# Patient Record
Sex: Male | Born: 1950 | Race: White | Hispanic: No | Marital: Married | Smoking: Never smoker
Health system: Southern US, Community
[De-identification: ages and names within clinical notes are randomized; demographics above are authoritative.]

## PROBLEM LIST (undated history)

## (undated) DIAGNOSIS — F32A Depression, unspecified: Secondary | ICD-10-CM

## (undated) DIAGNOSIS — K219 Gastro-esophageal reflux disease without esophagitis: Secondary | ICD-10-CM

## (undated) DIAGNOSIS — F419 Anxiety disorder, unspecified: Secondary | ICD-10-CM

## (undated) DIAGNOSIS — C9590 Leukemia, unspecified not having achieved remission: Secondary | ICD-10-CM

## (undated) DIAGNOSIS — J189 Pneumonia, unspecified organism: Secondary | ICD-10-CM

## (undated) DIAGNOSIS — E119 Type 2 diabetes mellitus without complications: Secondary | ICD-10-CM

## (undated) HISTORY — PX: WISDOM TOOTH EXTRACTION: SHX21

## (undated) HISTORY — DX: Leukemia, unspecified not having achieved remission: C95.90

## (undated) HISTORY — PX: COLONOSCOPY: SHX174

## (undated) HISTORY — DX: Type 2 diabetes mellitus without complications: E11.9

---

## 2017-04-02 ENCOUNTER — Encounter: Payer: Self-pay | Admitting: Oncology

## 2017-04-02 ENCOUNTER — Inpatient Hospital Stay: Payer: 59 | Attending: Oncology | Admitting: Oncology

## 2017-04-02 DIAGNOSIS — G629 Polyneuropathy, unspecified: Secondary | ICD-10-CM | POA: Diagnosis not present

## 2017-04-02 DIAGNOSIS — C9110 Chronic lymphocytic leukemia of B-cell type not having achieved remission: Secondary | ICD-10-CM | POA: Diagnosis not present

## 2017-04-02 DIAGNOSIS — M21379 Foot drop, unspecified foot: Secondary | ICD-10-CM

## 2017-04-02 DIAGNOSIS — Z800 Family history of malignant neoplasm of digestive organs: Secondary | ICD-10-CM | POA: Diagnosis not present

## 2017-04-02 DIAGNOSIS — Z7984 Long term (current) use of oral hypoglycemic drugs: Secondary | ICD-10-CM | POA: Insufficient documentation

## 2017-04-02 DIAGNOSIS — E119 Type 2 diabetes mellitus without complications: Secondary | ICD-10-CM | POA: Diagnosis not present

## 2017-04-02 DIAGNOSIS — R634 Abnormal weight loss: Secondary | ICD-10-CM

## 2017-04-02 DIAGNOSIS — Z79899 Other long term (current) drug therapy: Secondary | ICD-10-CM | POA: Diagnosis not present

## 2017-04-02 NOTE — Progress Notes (Signed)
Patient is here today for new patient appointment he is on disability so any notes needs to be faxed to his employer.

## 2017-04-02 NOTE — Progress Notes (Signed)
[facility]ravis [name]  Telephone:([phone] Fax:(336) [phone]  ID: Weldon Picking OB: [birth year]  MR#: [id number]  WSF#:[id number]  Patient Care Team: No Pcp Per Patient as PCP - General (General Practice)  CHIEF COMPLAINT: CLL.  INTERVAL HISTORY: Patient is a 66 year old male who was originally diagnosed with CLL in 2006. Patient was asymptomatic with a stable white blood count at that time, therefore no treatment was initiated. More recently he started having abnormal weight loss as well as multiple neurologic symptoms including neuropathy and foot drop that after complete workup was determined to be secondary to his CLL. He was subsequently initiated on Imbruvica with improvement of his symptoms. He continues to have chronic neurologic problems, but otherwise feels at his baseline. He denies any recent fevers or illnesses. He denies any further weight loss. He has a fair appetite. He has no chest pain or shortness of breath. He denies any nausea, vomiting, constipation, or diarrhea. He has no urinary complaints. Patient offers no further specific complaints today.  REVIEW OF SYSTEMS:   Review of Systems  Constitutional: Positive for weight loss. Negative for fever and malaise/fatigue.  Respiratory: Negative.  Negative for cough and shortness of breath.   Cardiovascular: Negative.  Negative for chest pain and leg swelling.  Gastrointestinal: Negative.  Negative for abdominal pain.  Genitourinary: Negative.   Musculoskeletal: Negative.   Neurological: Positive for tingling, sensory change and focal weakness. Negative for weakness.  Psychiatric/Behavioral: Negative.  The patient is not nervous/anxious.     As per HPI. Otherwise, a complete review of systems is negative.  PAST MEDICAL HISTORY: Past Medical History:  Diagnosis Date  . Diabetes mellitus without complication ([place])   . Leukemia ([place])     PAST SURGICAL HISTORY: History reviewed. No pertinent surgical  history.  FAMILY HISTORY: Family History  Problem Relation Age of Onset  . Leukemia Mother   . Colon cancer Father   . Diabetes Father   . Heart disease Father   . Diabetes Brother     ADVANCED DIRECTIVES (Y/N):  N  HEALTH MAINTENANCE: Social History  Substance Use Topics  . Smoking status: Never Smoker  . Smokeless tobacco: Never Used  . Alcohol use No     Colonoscopy:  PAP:  Bone density:  Lipid panel:  No Known Allergies  Current Outpatient Prescriptions  Medication Sig Dispense Refill  . acetaminophen (TYLENOL) 500 MG tablet Take 2,000 mg by mouth daily.    . Ibrutinib (IMBRUVICA) 420 MG TABS Take 420 mg by mouth daily.    . metFORMIN (GLUCOPHAGE) 1000 MG tablet Take 1,000 mg by mouth 2 (two) times daily with a meal.     No current facility-administered medications for this visit.     OBJECTIVE: Vitals:   04/02/17 1341  BP: 133/81  Pulse: 69  Resp: 18  Temp: 97.2 F (36.2 C)     Body mass index is 24.11 kg/m.    ECOG FS:1 - Symptomatic but completely ambulatory  General: Well-developed, well-nourished, no acute distress. Eyes: Pink conjunctiva, anicteric sclera. HEENT: Normocephalic, moist mucous membranes, clear oropharnyx. Lungs: Clear to auscultation bilaterally. Heart: Regular rate and rhythm. No rubs, murmurs, or gallops. Abdomen: Soft, nontender, nondistended. No organomegaly noted, normoactive bowel sounds. Musculoskeletal: No edema, cyanosis, or clubbing. Neuro: Alert, answering all questions appropriately. Cranial nerves grossly intact. Skin: No rashes or petechiae noted. Psych: Normal affect. Lymphatics: No cervical, calvicular, axillary or inguinal LAD.   LAB RESULTS:  No results found for: NA, K, CL, CO2, GLUCOSE, BUN,  CREATININE, CALCIUM, PROT, ALBUMIN, AST, ALT, ALKPHOS, BILITOT, GFRNONAA, GFRAA  No results found for: WBC, NEUTROABS, HGB, HCT, MCV, PLT   STUDIES: No results found.  ASSESSMENT: CLL.  PLAN:    1. CLL: Patient  most recent white blood cell count on January 15, 2017 was reported as 110. This is increased from previous, the thought to be secondary to treatment effect.  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 PET scan revealed diffuse bilateral cervical, axillary, retroperitoneal, iliac, and inguinal lymphadenopathy with a low level of metabolic uptake consistent with CLL. 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 Patient initiated Dolores Lory in approximately November 2017 with significant improvement of his B symptoms including his unusual neuropathy symptoms. Will continue current treatment until intolerable side effects or progression of disease. Return to clinic in 6 weeks with laboratory work and further evaluation. 2. 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 3. Disability: Given patient's persistent neurologic symptoms, he wishes to continue disability at this time.  Patient expressed understanding and was in agreement with this plan. He also understands that He can call clinic at any time with any questions, concerns, or complaints.   Cancer Staging No matching staging information was found for the patient.  Lloyd Huger, MD   04/09/2017 12:39 AM

## 2017-04-20 ENCOUNTER — Telehealth: Payer: Self-pay

## 2017-04-20 NOTE — Telephone Encounter (Signed)
Spoke with diplomat and I will correct information on fax and have finn sign   Patient has been notified that I will fax over rx once corrected and signed

## 2017-04-20 NOTE — Telephone Encounter (Signed)
-----   Message from Secundino Ginger sent at 04/20/2017  1:29 PM EDT ----- Regarding: [place]: [phone] This pharm called me and said they needed a RX from [facility] for [place]. They are sending a template to your fax number. She said mark our the phy name on there and put Dr. Gary Fleet name and address. I talked to Caryl Pina but she said you could talk to anyone.

## 2017-05-12 NOTE — Progress Notes (Signed)
[name]  Telephone:(336) [phone] Fax:(336) [phone]  ID: Travis [name] OB: 1951-04-28  MR#: [id number]  TKW#:[id number]  Patient Care Team: Patient, No Pcp Per as PCP - General (General Practice)  CHIEF COMPLAINT: CLL.  INTERVAL HISTORY: Patient returns to clinic today for repeat laboratory work and further evaluation. He continues to tolerate Imbruvica without significant side effects. He has chronic weakness and fatigue.  He continues to have chronic neurologic problems, but otherwise feels at his baseline. He denies any recent fevers or illnesses. He denies any further weight loss. He has a fair appetite. He has no chest pain or shortness of breath. He denies any nausea, vomiting, constipation, or diarrhea. He has no urinary complaints. Patient offers no further specific complaints today.  REVIEW OF SYSTEMS:   Review of Systems  Constitutional: Negative for fever, malaise/fatigue and weight loss.  Respiratory: Negative.  Negative for cough and shortness of breath.   Cardiovascular: Negative.  Negative for chest pain and leg swelling.  Gastrointestinal: Negative.  Negative for abdominal pain.  Genitourinary: Negative.   Musculoskeletal: Negative.   Skin: Negative.  Negative for rash.  Neurological: Positive for tingling, sensory change and focal weakness. Negative for weakness.  Psychiatric/Behavioral: Negative.  The patient is not nervous/anxious.     As per HPI. Otherwise, a complete review of systems is negative.  PAST MEDICAL HISTORY: Past Medical History:  Diagnosis Date  . Diabetes mellitus without complication ([place])   . Leukemia ([place])     PAST SURGICAL HISTORY: No past surgical history on file.  FAMILY HISTORY: Family History  Problem Relation Age of Onset  . Leukemia Mother   . Colon cancer Father   . Diabetes Father   . Heart disease Father   . Diabetes Brother     ADVANCED DIRECTIVES (Y/N):  N  HEALTH MAINTENANCE: Social History    Substance Use Topics  . Smoking status: Never Smoker  . Smokeless tobacco: Never Used  . Alcohol use No     Colonoscopy:  PAP:  Bone density:  Lipid panel:  No Known Allergies  Current Outpatient Prescriptions  Medication Sig Dispense Refill  . acetaminophen (TYLENOL) 500 MG tablet Take 2,000 mg by mouth daily.    . Ibrutinib (IMBRUVICA) 420 MG TABS Take 420 mg by mouth daily.    . metFORMIN (GLUCOPHAGE) 1000 MG tablet Take 1,000 mg by mouth 2 (two) times daily with a meal.     No current facility-administered medications for this visit.     OBJECTIVE: Vitals:   05/14/17 1014  BP: 125/85  Pulse: 65  Resp: 20  Temp: 97.7 F (36.5 C)     Body mass index is 25.24 kg/m.    ECOG FS:1 - Symptomatic but completely ambulatory  General: Well-developed, well-nourished, no acute distress. Eyes: Pink conjunctiva, anicteric sclera. Lungs: Clear to auscultation bilaterally. Heart: Regular rate and rhythm. No rubs, murmurs, or gallops. Abdomen: Soft, nontender, nondistended. No organomegaly noted, normoactive bowel sounds. Musculoskeletal: No edema, cyanosis, or clubbing. Neuro: Alert, answering all questions appropriately. Cranial nerves grossly intact. Skin: No rashes or petechiae noted. Psych: Normal affect.   LAB RESULTS:  No results found for: NA, K, CL, CO2, GLUCOSE, BUN, CREATININE, CALCIUM, PROT, ALBUMIN, AST, ALT, ALKPHOS, BILITOT, GFRNONAA, GFRAA  Lab Results  Component Value Date   WBC 62.9 (HH) 05/14/2017   NEUTROABS 5.0 05/14/2017   HGB 14.3 05/14/2017   HCT 44.8 05/14/2017   MCV 81.9 05/14/2017   PLT 147 (L) 05/14/2017  STUDIES: No results found.  ASSESSMENT: CLL.  PLAN:    1. CLL: Patient most recent white blood cell count on January 15, 2017 was reported as 110. Today's result is now down to 62.9. 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 PET scan revealed diffuse bilateral cervical,  axillary, retroperitoneal, iliac, and inguinal lymphadenopathy with a low level of metabolic uptake consistent with CLL. 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 Patient initiated Dolores Lory in approximately November 2017 with significant improvement of his B symptoms including his unusual neuropathy symptoms. Will continue current treatment until intolerable side effects or progression of disease. Return to clinic in 6 weeks with laboratory work and then in 3 months with laboratory work and further evaluation. 2. 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 Monitor. 3. Disability: Given patient's persistent neurologic symptoms, he wishes to continue disability at this time.  Approximately 30 minutes was spent in discussion of which greater than 50% was consultation.  Patient expressed understanding and was in agreement with this plan. He also understands that He can call clinic at any time with any questions, concerns, or complaints.    Travis Huger, MD   05/14/2017 10:32 AM

## 2017-05-14 ENCOUNTER — Inpatient Hospital Stay: Payer: 59

## 2017-05-14 ENCOUNTER — Inpatient Hospital Stay: Payer: 59 | Attending: Oncology | Admitting: Oncology

## 2017-05-14 VITALS — BP 125/85 | HR 65 | Temp 97.7°F | Resp 20 | Wt 181.0 lb

## 2017-05-14 DIAGNOSIS — R5383 Other fatigue: Secondary | ICD-10-CM | POA: Diagnosis not present

## 2017-05-14 DIAGNOSIS — Z800 Family history of malignant neoplasm of digestive organs: Secondary | ICD-10-CM

## 2017-05-14 DIAGNOSIS — Z7984 Long term (current) use of oral hypoglycemic drugs: Secondary | ICD-10-CM | POA: Diagnosis not present

## 2017-05-14 DIAGNOSIS — Z79899 Other long term (current) drug therapy: Secondary | ICD-10-CM

## 2017-05-14 DIAGNOSIS — C9110 Chronic lymphocytic leukemia of B-cell type not having achieved remission: Secondary | ICD-10-CM

## 2017-05-14 DIAGNOSIS — G629 Polyneuropathy, unspecified: Secondary | ICD-10-CM | POA: Diagnosis not present

## 2017-05-14 DIAGNOSIS — E119 Type 2 diabetes mellitus without complications: Secondary | ICD-10-CM | POA: Diagnosis not present

## 2017-05-14 DIAGNOSIS — R531 Weakness: Secondary | ICD-10-CM | POA: Diagnosis not present

## 2017-05-14 LAB — CBC WITH DIFFERENTIAL/PLATELET
BASOS ABS: 0.3 10*3/uL — AB (ref 0–0.1)
BASOS PCT: 1 %
EOS PCT: 1 %
Eosinophils Absolute: 0.3 10*3/uL (ref 0–0.7)
HEMATOCRIT: 44.8 % (ref 40.0–52.0)
Hemoglobin: 14.3 g/dL (ref 13.0–18.0)
Lymphocytes Relative: 88 %
Lymphs Abs: 56.3 10*3/uL — ABNORMAL HIGH (ref 1.0–3.6)
MCH: 26.2 pg (ref 26.0–34.0)
MCHC: 32 g/dL (ref 32.0–36.0)
MCV: 81.9 fL (ref 80.0–100.0)
MONO ABS: 1 10*3/uL (ref 0.2–1.0)
Monocytes Relative: 2 %
NEUTROS ABS: 5 10*3/uL (ref 1.4–6.5)
Neutrophils Relative %: 8 %
Platelets: 147 10*3/uL — ABNORMAL LOW (ref 150–440)
RBC: 5.47 MIL/uL (ref 4.40–5.90)
RDW: 14.7 % — AB (ref 11.5–14.5)
WBC: 62.9 10*3/uL (ref 3.8–10.6)

## 2017-05-14 NOTE — Progress Notes (Signed)
Patient reports being more unsteady on his feet, no other complaints.

## 2017-06-25 ENCOUNTER — Inpatient Hospital Stay: Payer: 59 | Attending: Oncology

## 2017-06-25 DIAGNOSIS — C9110 Chronic lymphocytic leukemia of B-cell type not having achieved remission: Secondary | ICD-10-CM

## 2017-06-29 LAB — CBC WITH DIFFERENTIAL/PLATELET
BASOS PCT: 0 %
Basophils Absolute: 0.1 10*3/uL (ref 0–0.1)
EOS PCT: 1 %
Eosinophils Absolute: 0.3 10*3/uL (ref 0–0.7)
HEMATOCRIT: 44.4 % (ref 40.0–52.0)
Hemoglobin: 14.5 g/dL (ref 13.0–18.0)
LYMPHS PCT: 87 %
Lymphs Abs: 45.4 10*3/uL — ABNORMAL HIGH (ref 1.0–3.6)
MCH: 27 pg (ref 26.0–34.0)
MCHC: 32.6 g/dL (ref 32.0–36.0)
MCV: 82.8 fL (ref 80.0–100.0)
MONO ABS: 0.9 10*3/uL (ref 0.2–1.0)
MONOS PCT: 2 %
NEUTROS ABS: 5.2 10*3/uL (ref 1.4–6.5)
Neutrophils Relative %: 10 %
PLATELETS: 139 10*3/uL — AB (ref 150–440)
RBC: 5.37 MIL/uL (ref 4.40–5.90)
RDW: 14.9 % — AB (ref 11.5–14.5)
WBC: 51.9 10*3/uL (ref 4.0–10.5)

## 2017-08-04 NOTE — Progress Notes (Signed)
[facility]  Telephone:(336) [phone] Fax:(336) [phone]  ID: Travis [name] OB: 1951/12/10  MR#: [id number]  RCB#:[id number]  Patient Care Team: Patient, No Pcp Per as PCP - General (General Practice)  CHIEF COMPLAINT: CLL.  INTERVAL HISTORY: Patient returns to clinic today for repeat laboratory work and further evaluation. He continues to tolerate Imbruvica without significant side effects. He has chronic weakness and fatigue.  He continues to have chronic neurologic problems, but otherwise feels at his baseline. He denies any recent fevers or illnesses. He denies any further weight loss. He has a fair appetite. He has no chest pain or shortness of breath. He denies any nausea, vomiting, constipation, or diarrhea. He has no urinary complaints. Patient offers no further specific complaints today.  REVIEW OF SYSTEMS:   Review of Systems  Constitutional: Negative for fever, malaise/fatigue and weight loss.  Respiratory: Negative.  Negative for cough and shortness of breath.   Cardiovascular: Negative.  Negative for chest pain and leg swelling.  Gastrointestinal: Negative.  Negative for abdominal pain.  Genitourinary: Negative.   Musculoskeletal: Negative.   Skin: Negative.  Negative for rash.  Neurological: Positive for tingling, sensory change and focal weakness. Negative for weakness.  Psychiatric/Behavioral: Negative.  The patient is not nervous/anxious.     As per HPI. Otherwise, a complete review of systems is negative.  PAST MEDICAL HISTORY: Past Medical History:  Diagnosis Date  . Diabetes mellitus without complication ([place])   . Leukemia ([place])     PAST SURGICAL HISTORY: No past surgical history on file.  FAMILY HISTORY: Family History  Problem Relation Age of Onset  . Leukemia Mother   . Colon cancer Father   . Diabetes Father   . Heart disease Father   . Diabetes Brother     ADVANCED DIRECTIVES (Y/N):  N  HEALTH MAINTENANCE: Social History    Substance Use Topics  . Smoking status: Never Smoker  . Smokeless tobacco: Never Used  . Alcohol use No     Colonoscopy:  PAP:  Bone density:  Lipid panel:  No Known Allergies  Current Outpatient Prescriptions  Medication Sig Dispense Refill  . acetaminophen (TYLENOL) 500 MG tablet Take 2,000 mg by mouth daily.    . Ibrutinib (IMBRUVICA) 420 MG TABS Take 420 mg by mouth daily.    . metFORMIN (GLUCOPHAGE) 1000 MG tablet Take 1,000 mg by mouth 2 (two) times daily with a meal.     No current facility-administered medications for this visit.     OBJECTIVE: Vitals:   08/06/17 1043  BP: 135/85  Pulse: 64  Resp: 18  Temp: (!) 96.7 F (35.9 C)     Body mass index is 26.03 kg/m.    ECOG FS:1 - Symptomatic but completely ambulatory  General: Well-developed, well-nourished, no acute distress. Eyes: Pink conjunctiva, anicteric sclera. Lungs: Clear to auscultation bilaterally. Heart: Regular rate and rhythm. No rubs, murmurs, or gallops. Abdomen: Soft, nontender, nondistended. No organomegaly noted, normoactive bowel sounds. Musculoskeletal: No edema, cyanosis, or clubbing. Neuro: Alert, answering all questions appropriately. Cranial nerves grossly intact. Skin: No rashes or petechiae noted. Psych: Normal affect.   LAB RESULTS:  No results found for: NA, K, CL, CO2, GLUCOSE, BUN, CREATININE, CALCIUM, PROT, ALBUMIN, AST, ALT, ALKPHOS, BILITOT, GFRNONAA, GFRAA  Lab Results  Component Value Date   WBC 47.6 (H) 08/06/2017   NEUTROABS 5.2 08/06/2017   HGB 14.8 08/06/2017   HCT 45.8 08/06/2017   MCV 83.9 08/06/2017   PLT 158 08/06/2017  STUDIES: No results found.  ASSESSMENT: CLL.  PLAN:    1. CLL: Patient's white blood count continues to trend down and is now 47.6. 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 PET scan revealed diffuse bilateral cervical, axillary, retroperitoneal, iliac, and inguinal  lymphadenopathy with a low level of metabolic uptake consistent with CLL. 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 Patient initiated Dolores Lory in approximately November 2017 with significant improvement of his B symptoms including his unusual neuropathy symptoms. Will continue current treatment until intolerable side effects or progression of disease. Return to clinic in 3 months for laboratory work and then in 6 months for laboratory work and further evaluation.  2. 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 Monitor. 3. Disability: Given patient's persistent neurologic symptoms, he wishes to continue disability at this time.  Approximately 30 minutes was spent in discussion of which greater than 50% was consultation.  Patient expressed understanding and was in agreement with this plan. He also understands that He can call clinic at any time with any questions, concerns, or complaints.    Lloyd Huger, MD   08/06/2017 1:14 PM

## 2017-08-06 ENCOUNTER — Inpatient Hospital Stay: Payer: 59

## 2017-08-06 ENCOUNTER — Inpatient Hospital Stay: Payer: 59 | Attending: Oncology | Admitting: Oncology

## 2017-08-06 VITALS — BP 135/85 | HR 64 | Temp 96.7°F | Resp 18 | Wt 186.6 lb

## 2017-08-06 DIAGNOSIS — C9110 Chronic lymphocytic leukemia of B-cell type not having achieved remission: Secondary | ICD-10-CM

## 2017-08-06 DIAGNOSIS — Z79899 Other long term (current) drug therapy: Secondary | ICD-10-CM | POA: Diagnosis not present

## 2017-08-06 DIAGNOSIS — G629 Polyneuropathy, unspecified: Secondary | ICD-10-CM

## 2017-08-06 DIAGNOSIS — R5383 Other fatigue: Secondary | ICD-10-CM

## 2017-08-06 DIAGNOSIS — R200 Anesthesia of skin: Secondary | ICD-10-CM | POA: Diagnosis not present

## 2017-08-06 DIAGNOSIS — Z7984 Long term (current) use of oral hypoglycemic drugs: Secondary | ICD-10-CM | POA: Diagnosis not present

## 2017-08-06 DIAGNOSIS — C9190 Lymphoid leukemia, unspecified not having achieved remission: Secondary | ICD-10-CM | POA: Diagnosis present

## 2017-08-06 DIAGNOSIS — E119 Type 2 diabetes mellitus without complications: Secondary | ICD-10-CM

## 2017-08-06 DIAGNOSIS — Z800 Family history of malignant neoplasm of digestive organs: Secondary | ICD-10-CM | POA: Diagnosis not present

## 2017-08-06 DIAGNOSIS — R531 Weakness: Secondary | ICD-10-CM | POA: Diagnosis not present

## 2017-08-06 LAB — CBC WITH DIFFERENTIAL/PLATELET
BASOS PCT: 1 %
Basophils Absolute: 0.2 10*3/uL — ABNORMAL HIGH (ref 0–0.1)
EOS ABS: 0.1 10*3/uL (ref 0–0.7)
EOS PCT: 0 %
HEMATOCRIT: 45.8 % (ref 40.0–52.0)
Hemoglobin: 14.8 g/dL (ref 13.0–18.0)
Lymphocytes Relative: 86 %
Lymphs Abs: 41.2 10*3/uL — ABNORMAL HIGH (ref 1.0–3.6)
MCH: 27.2 pg (ref 26.0–34.0)
MCHC: 32.4 g/dL (ref 32.0–36.0)
MCV: 83.9 fL (ref 80.0–100.0)
MONO ABS: 0.8 10*3/uL (ref 0.2–1.0)
MONOS PCT: 2 %
Neutro Abs: 5.2 10*3/uL (ref 1.4–6.5)
Neutrophils Relative %: 11 %
Platelets: 158 10*3/uL (ref 150–440)
RBC: 5.46 MIL/uL (ref 4.40–5.90)
RDW: 14.4 % (ref 11.5–14.5)
WBC: 47.6 10*3/uL — ABNORMAL HIGH (ref 3.8–10.6)

## 2017-10-06 ENCOUNTER — Other Ambulatory Visit: Payer: Self-pay | Admitting: Oncology

## 2017-10-06 DIAGNOSIS — C9110 Chronic lymphocytic leukemia of B-cell type not having achieved remission: Secondary | ICD-10-CM

## 2017-11-01 ENCOUNTER — Telehealth: Payer: Self-pay | Admitting: Oncology

## 2017-11-01 NOTE — Telephone Encounter (Signed)
[facility] Patient Advocate Encounter  Received paper work from [place] to do a written PA for patient. Faxed back to them.   [place] Patient Advocate [phone] 11/01/2017 1:31 PM

## 2017-11-05 ENCOUNTER — Inpatient Hospital Stay: Payer: 59

## 2017-11-09 ENCOUNTER — Inpatient Hospital Stay: Payer: 59 | Attending: Oncology

## 2017-11-09 DIAGNOSIS — C9110 Chronic lymphocytic leukemia of B-cell type not having achieved remission: Secondary | ICD-10-CM

## 2017-11-09 DIAGNOSIS — C9190 Lymphoid leukemia, unspecified not having achieved remission: Secondary | ICD-10-CM | POA: Insufficient documentation

## 2017-11-09 LAB — CBC WITH DIFFERENTIAL/PLATELET
Basophils Absolute: 0.1 10*3/uL (ref 0–0.1)
Basophils Relative: 1 %
EOS ABS: 0.2 10*3/uL (ref 0–0.7)
EOS PCT: 1 %
HCT: 46.3 % (ref 40.0–52.0)
Hemoglobin: 15.2 g/dL (ref 13.0–18.0)
LYMPHS ABS: 20.6 10*3/uL — AB (ref 1.0–3.6)
LYMPHS PCT: 77 %
MCH: 27.5 pg (ref 26.0–34.0)
MCHC: 32.8 g/dL (ref 32.0–36.0)
MCV: 83.9 fL (ref 80.0–100.0)
MONO ABS: 0.7 10*3/uL (ref 0.2–1.0)
Monocytes Relative: 3 %
Neutro Abs: 4.6 10*3/uL (ref 1.4–6.5)
Neutrophils Relative %: 18 %
PLATELETS: 144 10*3/uL — AB (ref 150–440)
RBC: 5.52 MIL/uL (ref 4.40–5.90)
RDW: 14 % (ref 11.5–14.5)
WBC: 26.3 10*3/uL — AB (ref 3.8–10.6)

## 2018-02-04 NOTE — Progress Notes (Signed)
[facility]  [phone] [phone]  ID: Travis [name] OB: January 15, 1951  MR#: [id number]  CSN#:[id number]  Patient Care Team: Patient, No Pcp Per as PCP - General ([facility]) Travis Lame, MD as Consulting Physician (Gastroenterology) Travis Bene, MD as Referring Physician (Neurology)  CHIEF COMPLAINT: CLL.  INTERVAL HISTORY: Patient returns to clinic today for repeat laboratory work and further evaluation. He has noticed increased difficulty swallowing of both solids and liquids over the past several weeks to months.  Patient states that he feels the food becomes "stuck" and then he subsequently vomits.  He also has noted the weakness and fatigue and neuropathy secondary to his nerve demyelination have been becoming worse over the same timeframe.  He otherwise is tolerating Imbruvica without significant side effects. He has chronic weakness and fatigue.  He continues to have chronic neurologic problems. He denies any recent fevers or illnesses. He denies any further weight loss. He has a fair appetite. He has no chest pain or shortness of breath. He denies any nausea, vomiting, constipation, or diarrhea. He has no urinary complaints. Patient offers no further specific complaints today.  REVIEW OF SYSTEMS:   Review of Systems  Constitutional: Positive for malaise/fatigue. Negative for fever and weight loss.  Respiratory: Negative.  Negative for cough and shortness of breath.   Cardiovascular: Negative.  Negative for chest pain and leg swelling.  Gastrointestinal: Positive for vomiting. Negative for abdominal pain, blood in stool, melena and nausea.  Genitourinary: Negative.   Musculoskeletal: Negative.   Skin: Negative.  Negative for rash.  Neurological: Positive for tingling, sensory change, focal weakness and weakness.  Psychiatric/Behavioral: Positive for depression. The patient is not nervous/anxious.     As per HPI. Otherwise, a  complete review of systems is negative.  PAST MEDICAL HISTORY: Past Medical History:  Diagnosis Date  . Diabetes mellitus without complication ([place])   . Leukemia ([place])     PAST SURGICAL HISTORY: No past surgical history on file.  FAMILY HISTORY: Family History  Problem Relation Age of Onset  . Leukemia Mother   . Colon cancer Father   . Diabetes Father   . Heart disease Father   . Diabetes Brother     ADVANCED DIRECTIVES (Y/N):  N  HEALTH MAINTENANCE: Social History   Tobacco Use  . Smoking status: Never Smoker  . Smokeless tobacco: Never Used  Substance Use Topics  . Alcohol use: No  . Drug use: No     Colonoscopy:  PAP:  Bone density:  Lipid panel:  No Known Allergies  Current Outpatient Medications  Medication Sig Dispense Refill  . NON FORMULARY Take 750 mg by mouth.    Marland Kitchen acetaminophen (TYLENOL) 500 MG tablet Take 2,000 mg by mouth daily.    . IMBRUVICA 420 MG TABS TAKE 1 TABLET (420MG) BY MOUTH ONE TIME DAILY WITH A FULL GLASS OF WATER. (Patient not taking: Reported on 02/11/2018) 28 tablet 5   No current facility-administered medications for this visit.     OBJECTIVE: Vitals:   02/11/18 1003  BP: (!) 154/89  Pulse: 68  Resp: 18  Temp: (!) 97.1 F (36.2 C)     Body mass index is 27.33 kg/m.    ECOG FS:1 - Symptomatic but completely ambulatory  General: Well-developed, well-nourished, no acute distress. Eyes: Pink conjunctiva, anicteric sclera. Lungs: Clear to auscultation bilaterally. Heart: Regular rate and rhythm. No rubs, murmurs, or gallops. Abdomen: Soft, nontender, nondistended. No organomegaly noted, normoactive bowel sounds. Musculoskeletal: No edema,  cyanosis, or clubbing. Neuro: Alert, answering all questions appropriately. Cranial nerves grossly intact. Skin: No rashes or petechiae noted. Psych: Normal affect.   LAB RESULTS:  No results found for: NA, K, CL, CO2, GLUCOSE, BUN, CREATININE, CALCIUM, PROT, ALBUMIN, AST, ALT,  ALKPHOS, BILITOT, GFRNONAA, GFRAA  Lab Results  Component Value Date   WBC 16.5 (H) 02/11/2018   NEUTROABS 4.0 02/11/2018   HGB 15.7 02/11/2018   HCT 46.7 02/11/2018   MCV 83.0 02/11/2018   PLT 137 (L) 02/11/2018     STUDIES: No results found.  ASSESSMENT: CLL.  PLAN:    1. CLL: Patient's white blood count continues to trend down and is now 16.5. 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 PET scan revealed diffuse bilateral cervical, axillary, retroperitoneal, iliac, and inguinal lymphadenopathy with a low level of metabolic uptake consistent with CLL. 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 Patient initiated Dolores Lory in approximately November 2017 with significant improvement of his B symptoms including his unusual neuropathy symptoms. Will continue current treatment until intolerable side effects or progression of disease. Return to clinic in 3 months for laboratory work and then in 6 months for laboratory work and further evaluation.  2. 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 Patient thinks his symptoms are becoming worse with increased difficulty walking recently.  He had a neurologist when living in [place], but has yet to establish care in [place].  A referral has been sent to neurology for further evaluation and treatment if necessary. 3.  Difficulty swallowing: See HPI for details.  A referral has been sent to GI for further evaluation including possible EGD and manometry. 4. Disability: Given patient's persistent neurologic symptoms, he wishes to continue disability at this time.  Approximately 30 minutes was spent in discussion of which greater than 50% was consultation.  Patient expressed understanding and was in agreement with this plan. He also understands that He can call clinic at any time with any questions,  concerns, or complaints.    Travis Huger, MD   02/11/2018 10:42 AM

## 2018-02-11 ENCOUNTER — Inpatient Hospital Stay: Payer: Managed Care, Other (non HMO) | Attending: Oncology | Admitting: Oncology

## 2018-02-11 ENCOUNTER — Other Ambulatory Visit: Payer: Self-pay

## 2018-02-11 ENCOUNTER — Inpatient Hospital Stay: Payer: Managed Care, Other (non HMO)

## 2018-02-11 VITALS — BP 154/89 | HR 68 | Temp 97.1°F | Resp 18 | Wt 196.0 lb

## 2018-02-11 DIAGNOSIS — Z79899 Other long term (current) drug therapy: Secondary | ICD-10-CM | POA: Diagnosis not present

## 2018-02-11 DIAGNOSIS — R531 Weakness: Secondary | ICD-10-CM

## 2018-02-11 DIAGNOSIS — M21379 Foot drop, unspecified foot: Secondary | ICD-10-CM | POA: Diagnosis not present

## 2018-02-11 DIAGNOSIS — C9110 Chronic lymphocytic leukemia of B-cell type not having achieved remission: Secondary | ICD-10-CM | POA: Diagnosis present

## 2018-02-11 DIAGNOSIS — Z806 Family history of leukemia: Secondary | ICD-10-CM | POA: Insufficient documentation

## 2018-02-11 DIAGNOSIS — R1110 Vomiting, unspecified: Secondary | ICD-10-CM | POA: Diagnosis not present

## 2018-02-11 DIAGNOSIS — G629 Polyneuropathy, unspecified: Secondary | ICD-10-CM | POA: Insufficient documentation

## 2018-02-11 DIAGNOSIS — C9190 Lymphoid leukemia, unspecified not having achieved remission: Secondary | ICD-10-CM

## 2018-02-11 DIAGNOSIS — F329 Major depressive disorder, single episode, unspecified: Secondary | ICD-10-CM | POA: Diagnosis not present

## 2018-02-11 DIAGNOSIS — R5383 Other fatigue: Secondary | ICD-10-CM | POA: Insufficient documentation

## 2018-02-11 DIAGNOSIS — R5381 Other malaise: Secondary | ICD-10-CM | POA: Diagnosis not present

## 2018-02-11 DIAGNOSIS — R1310 Dysphagia, unspecified: Secondary | ICD-10-CM | POA: Diagnosis not present

## 2018-02-11 DIAGNOSIS — E119 Type 2 diabetes mellitus without complications: Secondary | ICD-10-CM | POA: Diagnosis not present

## 2018-02-11 DIAGNOSIS — Z800 Family history of malignant neoplasm of digestive organs: Secondary | ICD-10-CM

## 2018-02-11 LAB — CBC WITH DIFFERENTIAL/PLATELET
Basophils Absolute: 0.1 10*3/uL (ref 0–0.1)
Basophils Relative: 1 %
Eosinophils Absolute: 0.2 10*3/uL (ref 0–0.7)
Eosinophils Relative: 1 %
HCT: 46.7 % (ref 40.0–52.0)
Hemoglobin: 15.7 g/dL (ref 13.0–18.0)
LYMPHS PCT: 70 %
Lymphs Abs: 11.6 10*3/uL — ABNORMAL HIGH (ref 1.0–3.6)
MCH: 27.8 pg (ref 26.0–34.0)
MCHC: 33.5 g/dL (ref 32.0–36.0)
MCV: 83 fL (ref 80.0–100.0)
MONOS PCT: 4 %
Monocytes Absolute: 0.6 10*3/uL (ref 0.2–1.0)
NEUTROS ABS: 4 10*3/uL (ref 1.4–6.5)
Neutrophils Relative %: 24 %
Platelets: 137 10*3/uL — ABNORMAL LOW (ref 150–440)
RBC: 5.63 MIL/uL (ref 4.40–5.90)
RDW: 13.5 % (ref 11.5–14.5)
WBC: 16.5 10*3/uL — ABNORMAL HIGH (ref 3.8–10.6)

## 2018-02-11 NOTE — Progress Notes (Signed)
Here for follow up. Admitted feeling depressed -counseling info given. Stated often has to vomit food up-since feels like it gets " stuck" -

## 2018-02-24 ENCOUNTER — Encounter: Payer: Self-pay | Admitting: Gastroenterology

## 2018-02-24 ENCOUNTER — Ambulatory Visit: Payer: Managed Care, Other (non HMO) | Admitting: Gastroenterology

## 2018-02-24 VITALS — BP 132/77 | HR 78 | Ht 71.0 in | Wt 195.8 lb

## 2018-02-24 DIAGNOSIS — R1310 Dysphagia, unspecified: Secondary | ICD-10-CM | POA: Diagnosis not present

## 2018-02-24 MED ORDER — OMEPRAZOLE 40 MG PO CPDR: 40.0000 mg | DELAYED_RELEASE_CAPSULE | Freq: Every day | ORAL | 3 refills | Status: DC

## 2018-02-24 NOTE — Progress Notes (Signed)
Travis Bellows MD, MRCP(U.K) [address]  [place]  [place], [place] [address]  Main: [phone]  Fax: [phone]   Gastroenterology Consultation  Referring Provider:     Lloyd Huger, MD Primary Care Physician:  Travis Huger, MD Primary Gastroenterologist:  Dr. Jonathon [name]  Reason for Consultation:     Dysphagia         HPI:   Travis [name] is a 67 y.o. y/o male referred for consultation & management  by Dr. Grayland [name], Travis November, MD.     Dysphagia: Onset and any progression: 3-4 years - about the same, noticing more oftn , occasionally once a day and sometimes every other day  Frequency: as above . At times he has to throw up  Foods affected : solids, liquids, mashed potatoes, creamed corn or baby carrots. Not with water. Some days his medications . Feels his spincter does not open  Prior episodes of impaction: no  History of asthma/allergy : no  History of heartburn/Reflux : yes some reflux, takes tums  Weight loss/weight gain : last 1 year has gained weight - he has not noticed any change of symptoms as he gained weight   Prior EGD: no  PPI/H2 blocker use : no      Past Medical History:  Diagnosis Date  . Diabetes mellitus without complication ([place])   . Leukemia ([place])     No past surgical history on file.  Prior to Admission medications   Medication Sig Start Date End Date Taking? Authorizing Provider  acetaminophen (TYLENOL) 500 MG tablet Take 2,000 mg by mouth daily.    [provider]  IMBRUVICA 420 MG TABS TAKE 1 TABLET (420MG ) BY MOUTH ONE TIME DAILY WITH A FULL GLASS OF WATER. Patient not taking: Reported on 02/11/2018 10/06/17   Travis Huger, MD  NON FORMULARY Take 750 mg by mouth.    [provider]    Family History  Problem Relation Age of Onset  . Leukemia Mother   . Colon cancer Father   . Diabetes Father   . Heart disease Father   . Diabetes Brother      Social History   Tobacco Use  . Smoking  status: Never Smoker  . Smokeless tobacco: Never Used  Substance Use Topics  . Alcohol use: No  . Drug use: No    Allergies as of 02/24/2018  . (No Known Allergies)    Review of Systems:    All systems reviewed and negative except where noted in HPI.   Physical Exam:  BP 132/77 (BP Location: Left Arm, Patient Position: Sitting, Cuff Size: Normal)   Pulse 78   Ht 5\' 11"  (1.803 m)   Wt 195 lb 12.8 oz (88.8 kg)   BMI 27.31 kg/m  No LMP for male patient. Psych:  Alert and cooperative. Normal mood and affect. General:   Alert,  Well-developed, well-nourished, pleasant and cooperative in NAD Head:  Normocephalic and atraumatic. Eyes:  Sclera clear, no icterus.   Conjunctiva pink. Ears:  Normal auditory acuity. Nose:  No deformity, discharge, or lesions. Mouth:  No deformity or lesions,oropharynx pink & moist. Neck:  Supple; no masses or thyromegaly. Lungs:  Respirations even and unlabored.  Clear throughout to auscultation.   No wheezes, crackles, or rhonchi. No acute distress. Heart:  Regular rate and rhythm; no murmurs, clicks, rubs, or gallops. Abdomen:  Normal bowel sounds.  No bruits.  Soft, non-tender and non-distended without masses, hepatosplenomegaly or hernias noted.  No guarding or rebound tenderness.    Msk:  Symmetrical without gross deformities. Good, equal movement & strength bilaterally. Pulses:  Normal pulses noted. Extremities:  No clubbing or edema.  No cyanosis. Neurologic:  Alert and oriented x3;  grossly normal neurologically. Skin:  Intact without significant lesions or rashes. No jaundice. Lymph Nodes:  No significant cervical adenopathy. Psych:  Alert and cooperative. Normal mood and affect.  Imaging Studies: No results found.  Assessment and Plan:   Travis [name] is a 67 y.o. y/o male has been referred for dysphagia.History suggests dysphagia for solids and liquids. 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   Plan  1. Will empirically treat for GERD with  prilosec 40 mg  2. Proceed with EGD+/- dilaton and r/o EOE 3. If above do not work will need manometry to r/o achalasia.   I have discussed alternative options, risks & benefits,  which include, but are not limited to, bleeding, infection, perforation,respiratory complication & drug reaction.  The patient agrees with this plan & written consent will be obtained.      Follow up in 12 weeks   Dr Travis Bellows MD,MRCP(U.K)

## 2018-03-08 ENCOUNTER — Telehealth: Payer: Self-pay

## 2018-03-08 NOTE — Telephone Encounter (Signed)
Patients EGD Instructions have been emailed to him at email address: [email or web address]  No rx is required for EGD.  Thanks [employer]

## 2018-03-14 ENCOUNTER — Encounter
Admission: RE | Disposition: A | Discharge status: Home / Self Care | Payer: Self-pay | Source: Ambulatory Visit | Attending: Gastroenterology

## 2018-03-14 ENCOUNTER — Ambulatory Visit: Payer: Managed Care, Other (non HMO) | Admitting: Anesthesiology

## 2018-03-14 ENCOUNTER — Ambulatory Visit
Admission: RE | Admit: 2018-03-14 | Discharge: 2018-03-14 | Disposition: A | Discharge status: Home / Self Care | Payer: Managed Care, Other (non HMO) | Source: Ambulatory Visit | Attending: Gastroenterology | Admitting: Gastroenterology

## 2018-03-14 DIAGNOSIS — R1310 Dysphagia, unspecified: Secondary | ICD-10-CM

## 2018-03-14 DIAGNOSIS — Z79899 Other long term (current) drug therapy: Secondary | ICD-10-CM | POA: Diagnosis not present

## 2018-03-14 DIAGNOSIS — K2970 Gastritis, unspecified, without bleeding: Secondary | ICD-10-CM | POA: Diagnosis not present

## 2018-03-14 DIAGNOSIS — K449 Diaphragmatic hernia without obstruction or gangrene: Secondary | ICD-10-CM | POA: Diagnosis not present

## 2018-03-14 DIAGNOSIS — K222 Esophageal obstruction: Secondary | ICD-10-CM

## 2018-03-14 DIAGNOSIS — K209 Esophagitis, unspecified: Secondary | ICD-10-CM

## 2018-03-14 DIAGNOSIS — K2950 Unspecified chronic gastritis without bleeding: Secondary | ICD-10-CM | POA: Insufficient documentation

## 2018-03-14 DIAGNOSIS — E119 Type 2 diabetes mellitus without complications: Secondary | ICD-10-CM | POA: Diagnosis not present

## 2018-03-14 DIAGNOSIS — Z7984 Long term (current) use of oral hypoglycemic drugs: Secondary | ICD-10-CM | POA: Diagnosis not present

## 2018-03-14 DIAGNOSIS — K2980 Duodenitis without bleeding: Secondary | ICD-10-CM | POA: Insufficient documentation

## 2018-03-14 DIAGNOSIS — K2990 Gastroduodenitis, unspecified, without bleeding: Secondary | ICD-10-CM

## 2018-03-14 HISTORY — PX: ESOPHAGOGASTRODUODENOSCOPY (EGD) WITH PROPOFOL: SHX5813

## 2018-03-14 LAB — GLUCOSE, CAPILLARY: GLUCOSE-CAPILLARY: 136 mg/dL — AB (ref 65–99)

## 2018-03-14 SURGERY — ESOPHAGOGASTRODUODENOSCOPY (EGD) WITH PROPOFOL
Anesthesia: General

## 2018-03-14 MED ORDER — SODIUM CHLORIDE 0.9 % IV SOLN
INTRAVENOUS | Status: DC
  Administered 2018-03-14: 13:00:00 via INTRAVENOUS
  Administered 2018-03-14: 1000 mL via INTRAVENOUS

## 2018-03-14 MED ORDER — LIDOCAINE HCL (CARDIAC) 20 MG/ML IV SOLN
INTRAVENOUS | Status: DC | PRN
  Administered 2018-03-14: 40 mg via INTRAVENOUS

## 2018-03-14 MED ORDER — FENTANYL CITRATE (PF) 100 MCG/2ML IJ SOLN
INTRAMUSCULAR | Status: AC
  Filled 2018-03-14: qty 2

## 2018-03-14 MED ORDER — MIDAZOLAM HCL 2 MG/2ML IJ SOLN
INTRAMUSCULAR | Status: AC
  Filled 2018-03-14: qty 2

## 2018-03-14 MED ORDER — PROPOFOL 10 MG/ML IV BOLUS
INTRAVENOUS | Status: DC | PRN
  Administered 2018-03-14 (×2): 50 mg via INTRAVENOUS

## 2018-03-14 MED ORDER — FENTANYL CITRATE (PF) 100 MCG/2ML IJ SOLN
INTRAMUSCULAR | Status: DC | PRN
  Administered 2018-03-14: 50 ug via INTRAVENOUS

## 2018-03-14 MED ORDER — OMEPRAZOLE 40 MG PO CPDR: 40.0000 mg | DELAYED_RELEASE_CAPSULE | Freq: Two times a day (BID) | ORAL | 1 refills | Status: DC

## 2018-03-14 MED ORDER — MIDAZOLAM HCL 2 MG/2ML IJ SOLN
INTRAMUSCULAR | Status: DC | PRN
  Administered 2018-03-14: 2 mg via INTRAVENOUS

## 2018-03-14 MED ORDER — PROPOFOL 10 MG/ML IV BOLUS
INTRAVENOUS | Status: AC
  Filled 2018-03-14: qty 20

## 2018-03-14 MED ORDER — LIDOCAINE HCL (PF) 1 % IJ SOLN
2.0000 mL | Freq: Once | INTRAMUSCULAR | Status: AC
  Administered 2018-03-14: 0.3 mL via INTRADERMAL

## 2018-03-14 MED ORDER — LIDOCAINE HCL (PF) 2 % IJ SOLN
INTRAMUSCULAR | Status: AC
  Filled 2018-03-14: qty 10

## 2018-03-14 MED ORDER — SUCRALFATE 1 G PO TABS: 1.0000 g | ORAL_TABLET | Freq: Four times a day (QID) | ORAL | 1 refills | Status: DC

## 2018-03-14 MED ORDER — LIDOCAINE HCL (PF) 1 % IJ SOLN
INTRAMUSCULAR | Status: AC
  Administered 2018-03-14: 0.3 mL via INTRADERMAL
  Filled 2018-03-14: qty 2

## 2018-03-14 MED ORDER — PROPOFOL 500 MG/50ML IV EMUL
INTRAVENOUS | Status: DC | PRN
  Administered 2018-03-14: 200 ug/kg/min via INTRAVENOUS

## 2018-03-14 NOTE — Transfer of Care (Signed)
Immediate Anesthesia Transfer of Care Note  Patient: Travis [name].  Procedure(s) Performed: ESOPHAGOGASTRODUODENOSCOPY (EGD) WITH PROPOFOL (N/A )  Patient Location: PACU  Anesthesia Type:General  Level of Consciousness: awake  Airway & Oxygen Therapy: Patient Spontanous Breathing and Patient connected to nasal cannula oxygen  Post-op Assessment: Report given to RN and Post -op Vital signs reviewed and stable  Post vital signs: Reviewed and stable  Last Vitals:  Vitals:   03/14/18 1106 03/14/18 1257  BP: 127/76 108/77  Pulse: 76 80  Resp: 17 16  Temp: 36.7 C 36.5 C  SpO2: 100% 95%    Last Pain:  Vitals:   03/14/18 1257  TempSrc: Tympanic         Complications: No apparent anesthesia complications

## 2018-03-14 NOTE — Anesthesia Procedure Notes (Signed)
Date/Time: 03/14/2018 12:25 PM Performed by: Allean Found, CRNA Pre-anesthesia Checklist: Patient identified, Emergency Drugs available, Suction available, Patient being monitored and Timeout performed Oxygen Delivery Method: Nasal cannula Placement Confirmation: positive ETCO2 Dental Injury: Teeth and Oropharynx as per pre-operative assessment

## 2018-03-14 NOTE — Anesthesia Post-op Follow-up Note (Signed)
Anesthesia QCDR form completed.        

## 2018-03-14 NOTE — Anesthesia Preprocedure Evaluation (Signed)
Anesthesia Evaluation  Patient identified by MRN, date of birth, ID band Patient awake    Reviewed: Allergy & Precautions, NPO status , Patient's Chart, lab work & pertinent test results  History of Anesthesia Complications Negative for: history of anesthetic complications  Airway Mallampati: II  TM Distance: >3 FB Neck ROM: Full    Dental no notable dental hx.    Pulmonary neg pulmonary ROS, neg sleep apnea, neg COPD,    breath sounds clear to auscultation- rhonchi (-) wheezing      Cardiovascular Exercise Tolerance: Good (-) hypertension(-) CAD, (-) Past MI, (-) Cardiac Stents and (-) CABG  Rhythm:Regular Rate:Normal - Systolic murmurs and - Diastolic murmurs    Neuro/Psych negative neurological ROS  negative psych ROS   GI/Hepatic negative GI ROS, Neg liver ROS,   Endo/Other  diabetes, Oral Hypoglycemic Agents  Renal/GU negative Renal ROS     Musculoskeletal negative musculoskeletal ROS (+)   Abdominal (+) - obese,   Peds  Hematology CLL   Anesthesia Other Findings Past Medical History: No date: Diabetes mellitus without complication ([facility]) No date: Leukemia ([place])   Reproductive/Obstetrics                             Anesthesia Physical Anesthesia Plan  ASA: II  Anesthesia Plan: General   Post-op Pain Management:    Induction: Intravenous  PONV Risk Score and Plan: 1 and Propofol infusion  Airway Management Planned: Natural Airway  Additional Equipment:   Intra-op Plan:   Post-operative Plan:   Informed Consent: I have reviewed the patients History and Physical, chart, labs and discussed the procedure including the risks, benefits and alternatives for the proposed anesthesia with the patient or authorized representative who has indicated his/her understanding and acceptance.   Dental advisory given  Plan Discussed with: CRNA and Anesthesiologist  Anesthesia Plan  Comments:         Anesthesia Quick Evaluation

## 2018-03-14 NOTE — Anesthesia Postprocedure Evaluation (Signed)
Anesthesia Post Note  Patient: Travis [name].  Procedure(s) Performed: ESOPHAGOGASTRODUODENOSCOPY (EGD) WITH PROPOFOL (N/A )  Patient location during evaluation: Endoscopy Anesthesia Type: General Level of consciousness: awake and alert Pain management: pain level controlled Vital Signs Assessment: post-procedure vital signs reviewed and stable Respiratory status: spontaneous breathing, nonlabored ventilation, respiratory function stable and patient connected to nasal cannula oxygen Cardiovascular status: blood pressure returned to baseline and stable Postop Assessment: no apparent nausea or vomiting Anesthetic complications: no     Last Vitals:  Vitals:   03/14/18 1307 03/14/18 1317  BP: 109/77 116/77  Pulse: 72 71  Resp: 19 17  Temp:    SpO2: 97% 94%    Last Pain:  Vitals:   03/14/18 1257  TempSrc: Tympanic                 [name] S

## 2018-03-14 NOTE — H&P (Signed)
     Travis Bellows, MD [address] [place], [place], [place], [address] [address], [place], [place], [place], [address] Phone: [phone]  Fax: [phone]  Primary Care Physician:  Lloyd Huger, MD   Pre-Procedure History & Physical: HPI:  Travis Nila. is a 67 y.o. male is here for an endoscopy    Past Medical History:  Diagnosis Date  . Diabetes mellitus without complication ([place])   . Leukemia ([place])     No past surgical history on file.  Prior to Admission medications   Medication Sig Start Date End Date Taking? Authorizing Provider  acetaminophen (TYLENOL) 500 MG tablet Take 2,000 mg by mouth daily.    [provider]  CANNABIDIOL PO Take 750 mg by mouth daily.    [provider]  IMBRUVICA 420 MG TABS TAKE 1 TABLET (420MG ) BY MOUTH ONE TIME DAILY WITH A FULL GLASS OF WATER. 10/06/17   Lloyd Huger, MD  metFORMIN (GLUCOPHAGE) 1000 MG tablet Take by mouth.    [provider]  omeprazole (PRILOSEC) 40 MG capsule Take 1 capsule (40 mg total) by mouth daily. 02/24/18   Travis Bellows, MD    Allergies as of 02/24/2018  . (No Known Allergies)    Family History  Problem Relation Age of Onset  . Leukemia Mother   . Colon cancer Father   . Diabetes Father   . Heart disease Father   . Diabetes Brother     Social History   Socioeconomic History  . Marital status: Married    Spouse name: Not on file  . Number of children: Not on file  . Years of education: Not on file  . Highest education level: Not on file  Social Needs  . Financial resource strain: Not on file  . Food insecurity - worry: Not on file  . Food insecurity - inability: Not on file  . Transportation needs - medical: Not on file  . Transportation needs - non-medical: Not on file  Occupational History  . Not on file  Tobacco Use  . Smoking status: Never Smoker  . Smokeless tobacco: Never Used  Substance and Sexual Activity  . Alcohol use: No  .  Drug use: No  . Sexual activity: Yes  Other Topics Concern  . Not on file  Social History Narrative  . Not on file    Review of Systems: See HPI, otherwise negative ROS  Physical Exam: BP 127/76   Pulse 76   Temp 98 F (36.7 C) (Tympanic)   Resp 17   Ht 5\' 11"  (1.803 m)   Wt 180 lb (81.6 kg)   SpO2 100%   BMI 25.10 kg/m  General:   Alert,  pleasant and cooperative in NAD Head:  Normocephalic and atraumatic. Neck:  Supple; no masses or thyromegaly. Lungs:  Clear throughout to auscultation, normal respiratory effort.    Heart:  +S1, +S2, Regular rate and rhythm, No edema. Abdomen:  Soft, nontender and nondistended. Normal bowel sounds, without guarding, and without rebound.   Neurologic:  Alert and  oriented x4;  grossly normal neurologically.  Impression/Plan: Travis More. is here for an endoscopy  to be performed for  evaluation of dysphagia     Risks, benefits, limitations, and alternatives regarding endoscopy have been reviewed with the patient.  Questions have been answered.  All parties agreeable.   Travis Bellows, MD  03/14/2018, 12:28 PM

## 2018-03-14 NOTE — Op Note (Signed)
[facility] Gastroenterology Patient Name: Travis [name] Procedure Date: 03/14/2018 12:32 PM MRN: [id number] Account #: [id number] Date of Birth: 1951-07-31 Admit Type: Outpatient Age: 67 Room: [facility] ENDO ROOM 2 Gender: Male Note Status: Finalized Procedure:            Upper GI endoscopy Indications:          Dysphagia Providers:            [name] MD, MD Referring MD:         Kathlene November. Grayland Ormond, MD (Referring MD) Medicines:            Monitored Anesthesia Care Complications:        No immediate complications. Procedure:            Pre-Anesthesia Assessment:                       - Prior to the procedure, a History and Physical was                        performed, and patient medications, allergies and                        sensitivities were reviewed. The patient's tolerance of                        previous anesthesia was reviewed.                       - The risks and benefits of the procedure and the                        sedation options and risks were discussed with the                        patient. All questions were answered and informed                        consent was obtained.                       - ASA Grade Assessment: III - A patient with severe                        systemic disease.                       After obtaining informed consent, the endoscope was                        passed under direct vision. Throughout the procedure,                        the patient's blood pressure, pulse, and oxygen                        saturations were monitored continuously. The Endoscope                        was introduced through the mouth, and advanced to the  third part of duodenum. The upper GI endoscopy was                        accomplished with ease. The patient tolerated the                        procedure well. Findings:      Localized moderate inflammation characterized by congestion (edema) and   erythema was found in the duodenal bulb. Biopsies were taken with a cold       forceps for histology.      Diffuse moderate inflammation characterized by congestion (edema),       erosions and erythema was found in the gastric antrum. Biopsies were       taken with a cold forceps for histology.      A 7 cm hiatal hernia was present.      LA Grade B (one or more mucosal breaks greater than 5 mm, not extending       between the tops of two mucosal folds) esophagitis with no bleeding was       found in the lower third of the esophagus.      One mild benign-appearing, intrinsic stenosis was found at the       gastroesophageal junction. This measured 1.3 cm (inner diameter) and was       traversed. Not dilated due to esophagitis.      The cardia and gastric fundus were normal on retroflexion. Impression:           - Duodenitis. Biopsied.                       - Gastritis. Biopsied.                       - 7 cm hiatal hernia.                       - LA Grade B esophagitis.                       - Benign-appearing esophageal stenosis. Recommendation:       - Discharge patient to home (with escort).                       - Resume previous diet.                       - Continue present medications.                       - Await pathology results.                       - Repeat upper endoscopy in 6 weeks to check healing.                       - 1. suggest increase prilosec to 40 mg BID                       2. Carafate QID                       3. Keep head end of the bed elevated at night Procedure Code(s):    ---  Professional ---                       [phone], Esophagogastroduodenoscopy, flexible, transoral;                        with biopsy, single or multiple Diagnosis Code(s):    --- Professional ---                       K29.80, Duodenitis without bleeding                       K29.70, Gastritis, unspecified, without bleeding                       K44.9, Diaphragmatic hernia without  obstruction or                        gangrene                       K20.9, Esophagitis, unspecified                       K22.2, Esophageal obstruction                       R13.10, Dysphagia, unspecified CPT copyright 2016 [employer]. All rights reserved. The codes documented in this report are preliminary and upon coder review may  be revised to meet current compliance requirements. [name], MD [name] MD, MD 03/14/2018 12:47:22 PM This report has been signed electronically. Number of Addenda: 0 Note Initiated On: 03/14/2018 12:32 PM      [facility]

## 2018-03-15 ENCOUNTER — Encounter: Payer: Self-pay | Admitting: Gastroenterology

## 2018-03-15 LAB — SURGICAL PATHOLOGY

## 2018-03-16 ENCOUNTER — Ambulatory Visit (INDEPENDENT_AMBULATORY_CARE_PROVIDER_SITE_OTHER): Payer: Managed Care, Other (non HMO) | Admitting: Vascular Surgery

## 2018-03-16 ENCOUNTER — Encounter (INDEPENDENT_AMBULATORY_CARE_PROVIDER_SITE_OTHER): Payer: Self-pay | Admitting: Vascular Surgery

## 2018-03-16 ENCOUNTER — Ambulatory Visit: Payer: Managed Care, Other (non HMO) | Attending: Neurology | Admitting: Physical Therapy

## 2018-03-16 DIAGNOSIS — I83813 Varicose veins of bilateral lower extremities with pain: Secondary | ICD-10-CM | POA: Insufficient documentation

## 2018-03-16 DIAGNOSIS — E118 Type 2 diabetes mellitus with unspecified complications: Secondary | ICD-10-CM

## 2018-03-16 DIAGNOSIS — E119 Type 2 diabetes mellitus without complications: Secondary | ICD-10-CM | POA: Insufficient documentation

## 2018-03-16 NOTE — Progress Notes (Signed)
Subjective:    Patient ID: Travis [name]., male    DOB: 09/02/1951, 67 y.o.   MRN: [id number] Chief Complaint  Patient presents with  . New Patient (Initial Visit)    PVD   The patient presents at the request of Dr. Manuella Ghazi for evaluation of painful varicose veins.  The patient endorses a long-standing history of varicose veins located to the bilateral legs.  He notes that over the last few months they have become bigger in size.  He also notes that they have become [name] uncomfortable.  The patient notes that the size of his varicosities and discomfort worsen as the day progresses or with sitting and standing for long periods of time.  The patient notes his discomfort has progressed to the point he is unable to function on a daily basis.  He feels that his symptoms are lifestyle limiting.  At this time, the patient does not engage in conservative therapy including wearing medical grade 1 compression socks or elevating his legs. The patient notes that the symptoms his left lower extremity are greater than his right.  He also notes that there is swelling to his left lower extremity compared to his right.  The patient denies any claudication-like symptoms, rest pain or ulceration to the lower extremity.  The patient denies any recent surgery or trauma to the bilateral lower extremity.  The patient denies any DVT history to the bilateral lower extremity.  Patient denies any fever, nausea vomiting.   Review of Systems  Constitutional: Negative.   HENT: Negative.   Eyes: Negative.   Respiratory: Negative.   Cardiovascular: Positive for leg swelling.       Painful varicose veins  Gastrointestinal: Negative.   Endocrine: Negative.   Genitourinary: Negative.   Musculoskeletal: Negative.   Skin: Negative.   Allergic/Immunologic: Negative.   Neurological: Negative.   Hematological: Negative.   Psychiatric/Behavioral: Negative.       Objective:   Physical Exam  Constitutional: He is oriented  to person, place, and time. He appears well-developed and well-nourished. No distress.  HENT:  Head: Normocephalic and atraumatic.  Eyes: Conjunctivae are normal. Pupils are equal, round, and reactive to light.  Neck: Normal range of motion.  Cardiovascular: Normal rate, regular rhythm, normal heart sounds and intact distal pulses.  Pulses:      Radial pulses are 2+ on the right side, and 2+ on the left side.  Hard to palpate pedal pulses however the bilateral feet are warm  Pulmonary/Chest: Effort normal and breath sounds normal.  Musculoskeletal: Normal range of motion. He exhibits edema (Mild to moderate left lower extremity edema.  Mild right lower extremity edema.).  Neurological: He is alert and oriented to person, place, and time.  Skin: He is not diaphoretic.  Mixture of greater than 1 cm and less than 1 cm diffuse varicosities noted to the bilateral lower extremity.  The patient's varicosities are larger on his left lower extremity.  There is mild stasis dermatitis.  There are no skin changes cellulitis or ulcerations noted.  Psychiatric: He has a normal mood and affect. His behavior is normal. Judgment and thought content normal.  Vitals reviewed.  BP 126/82 (BP Location: Right Arm, Patient Position: Sitting)   Pulse 73   Resp 14   Ht 5\' 11"  (1.803 m)   Wt 195 lb (88.5 kg)   BMI 27.20 kg/m   Past Medical History:  Diagnosis Date  . Diabetes mellitus without complication ([place])   . Leukemia ([place])  Social History   Socioeconomic History  . Marital status: Married    Spouse name: Not on file  . Number of children: Not on file  . Years of education: Not on file  . Highest education level: Not on file  Social Needs  . Financial resource strain: Not on file  . Food insecurity - worry: Not on file  . Food insecurity - inability: Not on file  . Transportation needs - medical: Not on file  . Transportation needs - non-medical: Not on file  Occupational History  . Not on  file  Tobacco Use  . Smoking status: Never Smoker  . Smokeless tobacco: Never Used  Substance and Sexual Activity  . Alcohol use: No  . Drug use: No  . Sexual activity: Yes  Other Topics Concern  . Not on file  Social History Narrative  . Not on file   Past Surgical History:  Procedure Laterality Date  . ESOPHAGOGASTRODUODENOSCOPY (EGD) WITH PROPOFOL N/A 03/14/2018   Procedure: ESOPHAGOGASTRODUODENOSCOPY (EGD) WITH PROPOFOL;  Surgeon: [name], MD;  [facility] ENDOSCOPY;  Service: Gastroenterology;  Laterality: N/A;   Family History  Problem Relation Age of Onset  . Leukemia Mother   . Colon cancer Father   . Diabetes Father   . Heart disease Father   . Diabetes Brother    No Known Allergies     Assessment & Plan:  The patient presents at the request of Dr. Manuella Ghazi for evaluation of painful varicose veins.  The patient endorses a long-standing history of varicose veins located to the bilateral legs.  He notes that over the last few months they have become bigger in size.  He also notes that they have become [name] uncomfortable.  The patient notes that the size of his varicosities and discomfort worsen as the day progresses or with sitting and standing for long periods of time.  The patient notes his discomfort has progressed to the point he is unable to function on a daily basis.  He feels that his symptoms are lifestyle limiting.  At this time, the patient does not engage in conservative therapy including wearing medical grade 1 compression socks or elevating his legs. The patient notes that the symptoms his left lower extremity are greater than his right.  He also notes that there is swelling to his left lower extremity compared to his right.  The patient denies any claudication-like symptoms, rest pain or ulceration to the lower extremity.  The patient denies any recent surgery or trauma to the bilateral lower extremity.  The patient denies any DVT history to the bilateral lower  extremity.  Patient denies any fever, nausea vomiting.  1. Varicose veins of bilateral lower extremities with pain - Stable The patient was encouraged to wear graduated compression stockings (20-30 mmHg) on a daily basis. The patient was instructed to begin wearing the stockings first thing in the morning and removing them in the evening. The patient was instructed specifically not to sleep in the stockings. Prescription given In addition, behavioral modification including elevation during the day will be initiated. Anti-inflammatories for pain. I will bring the patient back in 1 month to assess his progress with conservative therapy I will bring the patient back in 1 month to have him undergo bilateral venous reflux exam to rule out any contributing venous disease to his discomfort Unable to palpate pedal pulses on exam.  I will bring the patient back at his convenience for an ABI to rule out any contributing peripheral  artery disease to his discomfort The patient will follow up in one months to asses conservative management.  The patient was instructed to call the office in the interim if any worsening edema or ulcerations to the legs, feet or toes occurs. The patient expresses their understanding.  - VAS [place] ABI WITH/WO TBI; Future - VAS [place] LOWER EXTREMITY VENOUS REFLUX; Future  2. Type 2 diabetes mellitus with complication, unspecified whether long term insulin use (HCC) - Stable Encouraged good control as its slows the progression of atherosclerotic disease  Current Outpatient Medications on File Prior to Visit  Medication Sig Dispense Refill  . acetaminophen (TYLENOL) 500 MG tablet Take 2,000 mg by mouth daily.    Marland Kitchen CANNABIDIOL PO Take 750 mg by mouth daily.    . IMBRUVICA 420 MG TABS TAKE 1 TABLET (420MG ) BY MOUTH ONE TIME DAILY WITH A FULL GLASS OF WATER. 28 tablet 5  . metFORMIN (GLUCOPHAGE) 1000 MG tablet Take by mouth.    Marland Kitchen omeprazole (PRILOSEC) 40 MG capsule Take 1 capsule (40 mg  total) by mouth daily. 90 capsule 3  . omeprazole (PRILOSEC) 40 MG capsule Take 1 capsule (40 mg total) by mouth 2 (two) times daily. 30 capsule 1  . Vitamin D, Ergocalciferol, (DRISDOL) 50000 units CAPS capsule Take by mouth.    . sucralfate (CARAFATE) 1 g tablet Take 1 tablet (1 g total) by mouth 4 (four) times daily. (Patient not taking: Reported on 03/16/2018) 120 tablet 1   No current facility-administered medications on file prior to visit.    There are no Patient Instructions on file for this visit. No Follow-up on file.  Tayron Hunnell A Traver Meckes, PA-C

## 2018-03-20 ENCOUNTER — Encounter: Payer: Self-pay | Admitting: Gastroenterology

## 2018-03-23 ENCOUNTER — Ambulatory Visit: Payer: Managed Care, Other (non HMO) | Admitting: Physical Therapy

## 2018-03-24 ENCOUNTER — Telehealth: Payer: Self-pay | Admitting: Oncology

## 2018-03-24 ENCOUNTER — Telehealth: Payer: Self-pay | Admitting: Pharmacist

## 2018-03-24 DIAGNOSIS — C9110 Chronic lymphocytic leukemia of B-cell type not having achieved remission: Secondary | ICD-10-CM

## 2018-03-24 MED ORDER — IBRUTINIB 420 MG PO TABS: 420.0000 mg | ORAL_TABLET | Freq: Every day | ORAL | 5 refills | Status: DC

## 2018-03-24 MED FILL — IMBRUVICA 420 MG TAB: 420 | 28 days supply | Qty: 28 | Fill #0

## 2018-03-24 NOTE — Telephone Encounter (Signed)
Oral Oncology Patient Advocate Encounter  Called [facility] spoke to [facility]. to let them know patient will no longer be filling thru them. He is going to fill thru [facility]. Imbruvica . His copay is $10.00. Called [facility] to give them his CC number. They will mail to patients home.   [place] Patient Advocate [phone] 03/24/2018 10:05 AM

## 2018-03-24 NOTE — Telephone Encounter (Signed)
Oral Oncology Pharmacist Encounter  Received refill prescription for Imbruvica (ibrutinib) for the treatment of CLL, planned duration until disease progression or unacceptable drug toxicity. Medication started 10/2016.  CBC from 02/11/18 assessed, no relevant lab abnormalities. Prescription dose and frequency assessed.   Current medication list in Epic reviewed, no DDIs with Imbruvica (ibrutinib) identified.  Patient education Counseled patient on administration, dosing, side effects, monitoring, drug-food interactions, safe handling, storage, and disposal. Patient will take 1 tablet (420mg ) by mouth daily with a full glass of water.  Side effects include but not limited to: diarrhea, nausea, hypertension, fatigue, bruising, rash.    Reviewed with patient importance of keeping a medication schedule and plan for any missed doses.  Patient did mention that he was feeling depressed. I asked him if he was currently seeing any one for his depression and he stated that he is currently in the process of getting set-up with a [facility] provider. I asked him if he felt like he needed immediate assistance for his depression and he said he did not need immediate help. I told him to reach out if he has trouble getting in to see someone for his depression.   Mr. Beranek voiced understanding and appreciation. All questions answered.  Prescription has been e-scribed to the [facility]. Oral Oncology Clinic will continue to follow.   Provided patient with Oral [facility] phone number. Patient knows to call the office with questions or concerns. Oral Chemotherapy Navigation Clinic will continue to follow.  Darl Pikes, PharmD, BCPS Hematology/Oncology Clinical Pharmacist [facility] [phone]  03/24/2018 10:01 AM

## 2018-03-25 NOTE — Telephone Encounter (Signed)
Oral Oncology Patient Advocate Encounter  Patients Imbruvica was shipped from [facility].    [place] Patient Advocate [phone] 03/25/2018 8:17 AM

## 2018-03-29 ENCOUNTER — Ambulatory Visit: Payer: Managed Care, Other (non HMO) | Admitting: Physical Therapy

## 2018-03-31 ENCOUNTER — Ambulatory Visit: Payer: Managed Care, Other (non HMO) | Admitting: Physical Therapy

## 2018-04-07 DIAGNOSIS — E559 Vitamin D deficiency, unspecified: Secondary | ICD-10-CM | POA: Insufficient documentation

## 2018-04-27 ENCOUNTER — Encounter (INDEPENDENT_AMBULATORY_CARE_PROVIDER_SITE_OTHER): Payer: Managed Care, Other (non HMO)

## 2018-04-27 ENCOUNTER — Ambulatory Visit (INDEPENDENT_AMBULATORY_CARE_PROVIDER_SITE_OTHER): Payer: Managed Care, Other (non HMO) | Admitting: Vascular Surgery

## 2018-04-27 MED FILL — IMBRUVICA 420 MG TAB: 420 | 28 days supply | Qty: 28 | Fill #1

## 2018-05-13 ENCOUNTER — Inpatient Hospital Stay: Payer: Managed Care, Other (non HMO) | Attending: Oncology

## 2018-05-25 MED FILL — IMBRUVICA 420 MG TAB: 420 | 28 days supply | Qty: 28 | Fill #2

## 2018-05-30 ENCOUNTER — Ambulatory Visit: Payer: Managed Care, Other (non HMO) | Admitting: Gastroenterology

## 2018-05-30 DIAGNOSIS — E119 Type 2 diabetes mellitus without complications: Secondary | ICD-10-CM | POA: Insufficient documentation

## 2018-05-30 DIAGNOSIS — K209 Esophagitis, unspecified without bleeding: Secondary | ICD-10-CM

## 2018-05-30 DIAGNOSIS — R1310 Dysphagia, unspecified: Secondary | ICD-10-CM | POA: Diagnosis not present

## 2018-05-30 DIAGNOSIS — C801 Malignant (primary) neoplasm, unspecified: Secondary | ICD-10-CM | POA: Insufficient documentation

## 2018-05-30 DIAGNOSIS — G63 Polyneuropathy in diseases classified elsewhere: Secondary | ICD-10-CM

## 2018-05-30 NOTE — Progress Notes (Signed)
Travis Bellows MD, MRCP(U.K) [address]  [place]  [place], [place] [address]  Main: [phone]  Fax: [phone]   Primary Care Physician: Adin Hector, MD  Primary Gastroenterologist:  Dr. Jonathon [name]   No chief complaint on file.   HPI: Travis Groot. is a 67 y.o. male    Summary of history :  He is here today to follow up to his initial visit on 02/24/18 when he was seen for dysphagia of 3-4 years duration , occurring a few times a week, affecting solids , gained weight . 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 Bx showed normal duodenak bx and chronic gastritis. Commenced on high dose PPI, carafate and plan was to repeat EGD+dilation in 6 weeks.   Taking his PPI. Lesser regurgitation. Needing to take some TUMS, not as much dysphagia     Current Outpatient Medications  Medication Sig Dispense Refill  . acetaminophen (TYLENOL) 500 MG tablet Take 2,000 mg by mouth daily.    Marland Kitchen CANNABIDIOL PO Take 750 mg by mouth daily.    . Ibrutinib (IMBRUVICA) 420 MG TABS Take 420 mg by mouth daily. 28 tablet 5  . metFORMIN (GLUCOPHAGE) 1000 MG tablet Take by mouth.    Marland Kitchen omeprazole (PRILOSEC) 40 MG capsule Take 1 capsule (40 mg total) by mouth daily. 90 capsule 3  . omeprazole (PRILOSEC) 40 MG capsule Take 1 capsule (40 mg total) by mouth 2 (two) times daily. 30 capsule 1  . sucralfate (CARAFATE) 1 g tablet Take 1 tablet (1 g total) by mouth 4 (four) times daily. (Patient not taking: Reported on 03/16/2018) 120 tablet 1   No current facility-administered medications for this visit.     Allergies as of 05/30/2018  . (No Known Allergies)    ROS:  General: Negative for anorexia, weight loss, fever, chills, fatigue, weakness. ENT: Negative for hoarseness, difficulty swallowing , nasal congestion. CV: Negative for chest pain, angina, palpitations,  dyspnea on exertion, peripheral edema.  Respiratory: Negative for dyspnea at rest, dyspnea on exertion, cough, sputum, wheezing.  GI: See history of present illness. GU:  Negative for dysuria, hematuria, urinary incontinence, urinary frequency, nocturnal urination.  Endo: Negative for unusual weight change.    Physical Examination:   There were no vitals taken for this visit.  General: Well-nourished, well-developed in no acute distress.  Eyes: No icterus. Conjunctivae pink. Mouth: Oropharyngeal mucosa moist and pink , no lesions erythema or exudate. Lungs: Clear to auscultation bilaterally. Non-labored. Heart: Regular rate and rhythm, no murmurs rubs or gallops.  Abdomen: Bowel sounds are normal, nontender, nondistended, no hepatosplenomegaly or masses, no abdominal bruits or hernia , no rebound or guarding.   Extremities: No lower extremity edema. No clubbing or deformities. Neuro: Alert and oriented x 3.  Grossly intact. Skin: Warm and dry, no jaundice.   Psych: Alert and cooperative, normal mood and affect.   Imaging Studies: No results found.  Assessment and Plan:   Travis Catherman. is a 67 y.o. y/o male here to follow up for dysphagia. EGD in 02/2018 showed severe esophagitis with GE junction stricture not dilated due to active inflammation. commenced on high dose PPI. 7 cm hiatal hernia   Plan  1. GERD patient information  2. Continue Prilosec 40 mg BID 3. Repeat EGD+ dilation  4. Hiatal hernia- briefly discussed surgery as  a long term option.    I have discussed alternative options, risks & benefits,  which include, but are not limited to, bleeding, infection, perforation,respiratory complication & drug reaction.  The patient agrees with this plan & written consent will be obtained.      Dr Travis Bellows  MD,MRCP [facility]) Follow up in 6 weeks

## 2018-06-12 MED ORDER — PROPOFOL 500 MG/50ML IV EMUL
INTRAVENOUS | Status: AC
  Filled 2018-06-12: qty 50

## 2018-06-13 ENCOUNTER — Ambulatory Visit: Payer: Managed Care, Other (non HMO) | Admitting: Anesthesiology

## 2018-06-13 ENCOUNTER — Other Ambulatory Visit: Payer: Self-pay

## 2018-06-13 ENCOUNTER — Ambulatory Visit
Admission: RE | Admit: 2018-06-13 | Discharge: 2018-06-13 | Disposition: A | Discharge status: Home / Self Care | Payer: Managed Care, Other (non HMO) | Source: Ambulatory Visit | Attending: Gastroenterology | Admitting: Gastroenterology

## 2018-06-13 ENCOUNTER — Encounter
Admission: RE | Disposition: A | Discharge status: Home / Self Care | Payer: Self-pay | Source: Ambulatory Visit | Attending: Gastroenterology

## 2018-06-13 ENCOUNTER — Encounter: Payer: Self-pay | Admitting: *Deleted

## 2018-06-13 DIAGNOSIS — K222 Esophageal obstruction: Secondary | ICD-10-CM

## 2018-06-13 DIAGNOSIS — Z79899 Other long term (current) drug therapy: Secondary | ICD-10-CM | POA: Insufficient documentation

## 2018-06-13 DIAGNOSIS — Z7984 Long term (current) use of oral hypoglycemic drugs: Secondary | ICD-10-CM | POA: Insufficient documentation

## 2018-06-13 DIAGNOSIS — Z856 Personal history of leukemia: Secondary | ICD-10-CM | POA: Insufficient documentation

## 2018-06-13 DIAGNOSIS — R1310 Dysphagia, unspecified: Secondary | ICD-10-CM | POA: Diagnosis not present

## 2018-06-13 DIAGNOSIS — K209 Esophagitis, unspecified without bleeding: Secondary | ICD-10-CM

## 2018-06-13 DIAGNOSIS — K449 Diaphragmatic hernia without obstruction or gangrene: Secondary | ICD-10-CM | POA: Diagnosis not present

## 2018-06-13 DIAGNOSIS — E119 Type 2 diabetes mellitus without complications: Secondary | ICD-10-CM | POA: Diagnosis not present

## 2018-06-13 HISTORY — PX: ESOPHAGOGASTRODUODENOSCOPY (EGD) WITH PROPOFOL: SHX5813

## 2018-06-13 LAB — GLUCOSE, CAPILLARY: Glucose-Capillary: 218 mg/dL — ABNORMAL HIGH (ref 65–99)

## 2018-06-13 SURGERY — ESOPHAGOGASTRODUODENOSCOPY (EGD) WITH PROPOFOL
Anesthesia: General

## 2018-06-13 MED ORDER — PROPOFOL 500 MG/50ML IV EMUL
INTRAVENOUS | Status: DC | PRN
  Administered 2018-06-13: 150 ug/kg/min via INTRAVENOUS

## 2018-06-13 MED ORDER — PROPOFOL 10 MG/ML IV BOLUS
INTRAVENOUS | Status: DC | PRN
  Administered 2018-06-13: 80 mg via INTRAVENOUS

## 2018-06-13 MED ORDER — SODIUM CHLORIDE 0.9 % IV SOLN
INTRAVENOUS | Status: DC
  Administered 2018-06-13: 08:00:00 via INTRAVENOUS

## 2018-06-13 MED ORDER — LIDOCAINE HCL (PF) 2 % IJ SOLN
INTRAMUSCULAR | Status: DC | PRN
  Administered 2018-06-13: 100 mg via INTRADERMAL

## 2018-06-13 NOTE — H&P (Signed)
Travis Bellows, MD [address], [place], [place], [place], [address] [address], [place], [place], [place], [address] Phone: [phone]  Fax: [phone]  Primary Care Physician:  Adin Hector, MD   Pre-Procedure History & Physical: HPI:  Travis Melland. is a 67 y.o. male is here for an endoscopy    Past Medical History:  Diagnosis Date  . Diabetes mellitus without complication ([place])   . Leukemia [facility])     Past Surgical History:  Procedure Laterality Date  . COLONOSCOPY    . ESOPHAGOGASTRODUODENOSCOPY (EGD) WITH PROPOFOL N/A 03/14/2018   Procedure: ESOPHAGOGASTRODUODENOSCOPY (EGD) WITH PROPOFOL;  Surgeon: Travis Bellows, MD;  Location: [facility] ENDOSCOPY;  Service: Gastroenterology;  Laterality: N/A;  . WISDOM TOOTH EXTRACTION      Prior to Admission medications   Medication Sig Start Date End Date Taking? Authorizing Provider  acetaminophen (TYLENOL) 500 MG tablet Take 2,000 mg by mouth daily.   Yes [provider]  CANNABIDIOL PO Take 750 mg by mouth daily.   Yes [provider]  Cholecalciferol (VITAMIN D3) 1000 units CAPS Take by mouth.   Yes [provider]  Ibrutinib (IMBRUVICA) 420 MG TABS Take 420 mg by mouth daily. 03/24/18  Yes Lloyd Huger, MD  metFORMIN (GLUCOPHAGE) 1000 MG tablet Take by mouth.   Yes [provider]  omeprazole (PRILOSEC) 40 MG capsule Take 1 capsule (40 mg total) by mouth daily. 02/24/18  Yes Travis Bellows, MD  sucralfate (CARAFATE) 1 g tablet Take 1 tablet (1 g total) by mouth 4 (four) times daily. 03/14/18 03/14/19 Yes Travis Bellows, MD  omeprazole (PRILOSEC) 40 MG capsule Take 1 capsule (40 mg total) by mouth 2 (two) times daily. 03/14/18 06/12/18  Travis Bellows, MD    Allergies as of 05/31/2018  . (No Known Allergies)    Family History  Problem Relation Age of Onset  . Leukemia Mother   . Colon cancer Father   . Diabetes Father   . Heart disease Father   . Diabetes Brother     Social  History   Socioeconomic History  . Marital status: Married    Spouse name: Not on file  . Number of children: Not on file  . Years of education: Not on file  . Highest education level: Not on file  Occupational History  . Not on file  Social Needs  . Financial resource strain: Not on file  . Food insecurity:    Worry: Not on file    Inability: Not on file  . Transportation needs:    Medical: Not on file    Non-medical: Not on file  Tobacco Use  . Smoking status: Never Smoker  . Smokeless tobacco: Never Used  Substance and Sexual Activity  . Alcohol use: Yes    Comment: rarely  . Drug use: No  . Sexual activity: Yes  Lifestyle  . Physical activity:    Days per week: Not on file    Minutes per session: Not on file  . Stress: Not on file  Relationships  . Social connections:    Talks on phone: Not on file    Gets together: Not on file    Attends religious service: Not on file    Active member of club or organization: Not on file    Attends meetings of clubs or organizations: Not on file    Relationship status: Not on file  . Intimate partner violence:    Fear of  current or ex partner: Not on file    Emotionally abused: Not on file    Physically abused: Not on file    Forced sexual activity: Not on file  Other Topics Concern  . Not on file  Social History Narrative  . Not on file    Review of Systems: See HPI, otherwise negative ROS  Physical Exam: BP (!) 142/92   Pulse 60   Temp (!) 96.1 F (35.6 C) (Tympanic)   Resp 16   Ht 5\' 11"  (1.803 m)   Wt 190 lb (86.2 kg)   SpO2 100%   BMI 26.50 kg/m  General:   Alert,  pleasant and cooperative in NAD Head:  Normocephalic and atraumatic. Neck:  Supple; no masses or thyromegaly. Lungs:  Clear throughout to auscultation, normal respiratory effort.    Heart:  +S1, +S2, Regular rate and rhythm, No edema. Abdomen:  Soft, nontender and nondistended. Normal bowel sounds, without guarding, and without rebound.     Neurologic:  Alert and  oriented x4;  grossly normal neurologically.  Impression/Plan: Travis More. is here for an endoscopy  And possible dilation to be performed for  evaluation of dysphagia    Risks, benefits, limitations, and alternatives regarding endoscopy and dilation  have been reviewed with the patient.  Questions have been answered.  All parties agreeable.   Travis Bellows, MD  06/13/2018, 8:06 AM

## 2018-06-13 NOTE — Anesthesia Procedure Notes (Signed)
Date/Time: 06/13/2018 8:13 AM Performed by: Nelda Marseille, CRNA Pre-anesthesia Checklist: Patient identified, Emergency Drugs available, Suction available, Patient being monitored and Timeout performed Oxygen Delivery Method: Nasal cannula

## 2018-06-13 NOTE — Anesthesia Preprocedure Evaluation (Signed)
Anesthesia Evaluation  Patient identified by MRN, date of birth, ID band Patient awake    Reviewed: Allergy & Precautions, NPO status , Patient's Chart, lab work & pertinent test results  History of Anesthesia Complications Negative for: history of anesthetic complications  Airway Mallampati: II  TM Distance: >3 FB Neck ROM: Full    Dental no notable dental hx.    Pulmonary neg pulmonary ROS, neg sleep apnea, neg COPD,    breath sounds clear to auscultation- rhonchi (-) wheezing      Cardiovascular Exercise Tolerance: Good (-) hypertension(-) CAD, (-) Past MI, (-) Cardiac Stents and (-) CABG  Rhythm:Regular Rate:Normal - Systolic murmurs and - Diastolic murmurs    Neuro/Psych negative neurological ROS  negative psych ROS   GI/Hepatic negative GI ROS, Neg liver ROS,   Endo/Other  diabetes, Oral Hypoglycemic Agents  Renal/GU negative Renal ROS     Musculoskeletal negative musculoskeletal ROS (+)   Abdominal (+) - obese,   Peds  Hematology CLL   Anesthesia Other Findings Past Medical History: No date: Diabetes mellitus without complication (HCC) No date: Leukemia ([place])   Reproductive/Obstetrics                             Anesthesia Physical  Anesthesia Plan  ASA: II  Anesthesia Plan: General   Post-op Pain Management:    Induction: Intravenous  PONV Risk Score and Plan: 2 and Propofol infusion  Airway Management Planned: Natural Airway  Additional Equipment:   Intra-op Plan:   Post-operative Plan:   Informed Consent: I have reviewed the patients History and Physical, chart, labs and discussed the procedure including the risks, benefits and alternatives for the proposed anesthesia with the patient or authorized representative who has indicated his/her understanding and acceptance.   Dental advisory given  Plan Discussed with: CRNA and Anesthesiologist  Anesthesia Plan  Comments:         Anesthesia Quick Evaluation

## 2018-06-13 NOTE — Op Note (Signed)
[facility] Patient Name: Travis [name] Procedure Date: 06/13/2018 7:35 AM MRN: [id number] Account #: [id number] Date of Birth: Feb 11, 1951 Admit Type: Outpatient Age: 67 Room: [facility] ENDO ROOM 4 Gender: Male Note Status: Finalized Procedure:            Upper GI endoscopy Indications:          Dysphagia Providers:            [name] MD, MD Referring MD:         Ramonita Lab, MD (Referring MD) Medicines:            Monitored Anesthesia Care Complications:        No immediate complications. Procedure:            Pre-Anesthesia Assessment:                       - Prior to the procedure, a History and Physical was                        performed, and patient medications, allergies and                        sensitivities were reviewed. The patient's tolerance of                        previous anesthesia was reviewed.                       - The risks and benefits of the procedure and the                        sedation options and risks were discussed with the                        patient. All questions were answered and informed                        consent was obtained.                       - ASA Grade Assessment: II - A patient with mild                        systemic disease.                       After obtaining informed consent, the endoscope was                        passed under direct vision. Throughout the procedure,                        the patient's blood pressure, pulse, and oxygen                        saturations were monitored continuously. The Endoscope                        was introduced through the mouth, and advanced to the  third part of duodenum. The upper GI endoscopy was                        accomplished with ease. The patient tolerated the                        procedure well. Findings:      The examined duodenum was normal.      A large hiatal hernia was present.      One  benign-appearing, intrinsic moderate (circumferential scarring or       stenosis; an endoscope may pass) stenosis was found at the       gastroesophageal junction. This stenosis measured 1.5 cm (inner       diameter) x less than one cm (in length). The stenosis was traversed. A       TTS dilator was passed through the scope. Dilation with a 12-13.5-15 mm       balloon and a 15-16.5-18 mm balloon dilator was performed to 18 mm. The       dilation site was examined following endoscope reinsertion and showed       moderate mucosal disruption. Impression:           - Normal examined duodenum.                       - Large hiatal hernia.                       - Benign-appearing esophageal stenosis.                       - No specimens collected. Recommendation:       - Discharge patient to home (with escort).                       - Resume previous diet.                       - Continue present medications.                       - Return to my office in 6 weeks. Procedure Code(s):    --- Professional ---                       [phone], Esophagogastroduodenoscopy, flexible, transoral;                        diagnostic, including collection of specimen(s) by                        brushing or washing, when performed (separate procedure) Diagnosis Code(s):    --- Professional ---                       K44.9, Diaphragmatic hernia without obstruction or                        gangrene                       K22.2, Esophageal obstruction                       R13.10, Dysphagia,  unspecified CPT copyright 2017 American Medical Association. All rights reserved. The codes documented in this report are preliminary and upon coder review may  be revised to meet current compliance requirements. [name], MD [name] MD, MD 06/13/2018 8:26:01 AM This report has been signed electronically. Number of Addenda: 0 Note Initiated On: 06/13/2018 7:35 AM      [facility]

## 2018-06-13 NOTE — Anesthesia Post-op Follow-up Note (Signed)
Anesthesia QCDR form completed.        

## 2018-06-13 NOTE — Anesthesia Postprocedure Evaluation (Signed)
Anesthesia Post Note  Patient: Travis [name].  Procedure(s) Performed: ESOPHAGOGASTRODUODENOSCOPY (EGD) WITH PROPOFOL (N/A )  Patient location during evaluation: Endoscopy Anesthesia Type: General Level of consciousness: awake and alert Pain management: pain level controlled Vital Signs Assessment: post-procedure vital signs reviewed and stable Respiratory status: spontaneous breathing, nonlabored ventilation, respiratory function stable and patient connected to nasal cannula oxygen Cardiovascular status: blood pressure returned to baseline and stable Postop Assessment: no apparent nausea or vomiting Anesthetic complications: no     Last Vitals:  Vitals:   06/13/18 0840 06/13/18 0850  BP: 120/90 (!) 136/92  Pulse: 65 63  Resp: 20 18  Temp:    SpO2: 100% 98%    Last Pain:  Vitals:   06/13/18 0820  TempSrc: Tympanic  PainSc:                  Martha Clan

## 2018-06-13 NOTE — Transfer of Care (Signed)
Immediate Anesthesia Transfer of Care Note  Patient: Travis [name].  Procedure(s) Performed: ESOPHAGOGASTRODUODENOSCOPY (EGD) WITH PROPOFOL (N/A )  Patient Location: PACU  Anesthesia Type:General  Level of Consciousness: sedated  Airway & Oxygen Therapy: Patient Spontanous Breathing and Patient connected to nasal cannula oxygen  Post-op Assessment: Report given to RN and Post -op Vital signs reviewed and stable  Post vital signs: Reviewed and stable  Last Vitals:  Vitals Value Taken Time  BP 116/79 06/13/2018  8:26 AM  Temp 36.1 C 06/13/2018  8:20 AM  Pulse 67 06/13/2018  8:26 AM  Resp 15 06/13/2018  8:26 AM  SpO2 99 % 06/13/2018  8:26 AM  Vitals shown include unvalidated device data.  Last Pain:  Vitals:   06/13/18 0820  TempSrc: Tympanic  PainSc:          Complications: No apparent anesthesia complications

## 2018-06-15 ENCOUNTER — Encounter: Payer: Self-pay | Admitting: Gastroenterology

## 2018-06-15 ENCOUNTER — Telehealth: Payer: Self-pay | Admitting: Pharmacist

## 2018-06-15 NOTE — Telephone Encounter (Signed)
Oral Chemotherapy Pharmacist Encounter  Follow-Up Form  Called patient today to follow up regarding patient's oral chemotherapy medication: Imbruvica (ibrutinib)   Original Start date of oral chemotherapy: 10/2016  Pt reports 0 tablets/doses of ibrutinib missed in the so far this month.   Pt reports the following side effects: None reported  Recent labs reviewed: CBC from 02/11/18  New medications?: sucralfate, no DDI with ibrutinib  Other Issues: None reported  Patient knows to call the office with questions or concerns. Oral Oncology Clinic will continue to follow.  Darl Pikes, PharmD, BCPS Hematology/Oncology Clinical Pharmacist [facility] [place] [facility] [phone]  06/15/2018 3:01 PM

## 2018-06-23 MED FILL — IMBRUVICA 420 MG TAB: 420 | 28 days supply | Qty: 28 | Fill #3

## 2018-06-26 ENCOUNTER — Other Ambulatory Visit: Payer: Self-pay | Admitting: Gastroenterology

## 2018-06-27 ENCOUNTER — Other Ambulatory Visit: Payer: Self-pay

## 2018-06-27 MED ORDER — OMEPRAZOLE 40 MG PO CPDR: 40.0000 mg | DELAYED_RELEASE_CAPSULE | Freq: Two times a day (BID) | ORAL | 0 refills | Status: DC

## 2018-06-27 MED ORDER — OMEPRAZOLE 40 MG PO CPDR: 40.0000 mg | DELAYED_RELEASE_CAPSULE | Freq: Two times a day (BID) | ORAL | 1 refills | Status: DC

## 2018-07-11 DIAGNOSIS — E1169 Type 2 diabetes mellitus with other specified complication: Secondary | ICD-10-CM | POA: Insufficient documentation

## 2018-07-11 DIAGNOSIS — B351 Tinea unguium: Secondary | ICD-10-CM | POA: Insufficient documentation

## 2018-07-21 ENCOUNTER — Telehealth: Payer: Self-pay | Admitting: Pharmacy Technician

## 2018-07-21 MED FILL — IMBRUVICA 420 MG TAB: 420 | 28 days supply | Qty: 28 | Fill #4

## 2018-07-21 NOTE — Telephone Encounter (Signed)
Oral Oncology Patient Advocate Encounter  Travis [name] reached out to me regarding a rejection on insurance for Travis [name].  I spoke with patient and he is aware of the issue.  The company he works for accidentally cancelled his insurance.  His wife added him to her plan in the meantime.    I submitted a Prior Authorization through CoverMyMeds for his Imbruvica.  KEY: [id number]   Prior Authorization for Travis [name] is approved.    PA# [id number] Effective dates: 07/21/2018 through 01/21/2019  I have informed the [place] Patient [place] Phone [phone] Fax [phone] 07/21/2018 2:59 PM

## 2018-08-08 NOTE — Progress Notes (Signed)
[place]  Telephone:([phone] [phone]  ID: [name]. OB: 02/05/1951  MR#: [id number]  LKJ#:[id number]  Patient Care Team: Adin Hector, MD as PCP - General (Internal Medicine) Lucilla Lame, MD as Consulting Physician (Gastroenterology) Anabel Bene, MD as Referring Physician (Neurology)  CHIEF COMPLAINT: CLL.  INTERVAL HISTORY: Patient returns to clinic today for repeat laboratory work and routine six-month evaluation.  He only has occasional difficulty swallowing now that he has been been seen by GI and has had some esophageal dilation.  His weakness and fatigue secondary to neuropathy is the same, possibly slightly worse.  He otherwise is tolerating Imbruvica without significant side effects. He continues to have chronic neurologic problems and insomnia. He denies any recent fevers or illnesses. He denies any weight loss. He has a fair appetite. He has no chest pain or shortness of breath. He denies any nausea, vomiting, constipation, or diarrhea. He has no urinary complaints.  Patient offers no further specific complaints today.  REVIEW OF SYSTEMS:   Review of Systems  Constitutional: Positive for malaise/fatigue. Negative for fever and weight loss.  Respiratory: Negative.  Negative for cough and shortness of breath.   Cardiovascular: Negative.  Negative for chest pain and leg swelling.  Gastrointestinal: Positive for vomiting. Negative for abdominal pain, blood in stool, melena and nausea.  Genitourinary: Negative.  Negative for dysuria.  Musculoskeletal: Negative.  Negative for back pain.  Skin: Negative.  Negative for rash.  Neurological: Positive for tingling, sensory change, focal weakness and weakness.  Psychiatric/Behavioral: Positive for depression. The patient has insomnia. The patient is not nervous/anxious.     As per HPI. Otherwise, a complete review of systems is negative.  PAST MEDICAL HISTORY: Past Medical  History:  Diagnosis Date  . Diabetes mellitus without complication ([place])   . Leukemia ([place])     PAST SURGICAL HISTORY: Past Surgical History:  Procedure Laterality Date  . COLONOSCOPY    . ESOPHAGOGASTRODUODENOSCOPY (EGD) WITH PROPOFOL N/A 03/14/2018   Procedure: ESOPHAGOGASTRODUODENOSCOPY (EGD) WITH PROPOFOL;  Surgeon: [name], MD;  Location: [facility] ENDOSCOPY;  Service: Gastroenterology;  Laterality: N/A;  . ESOPHAGOGASTRODUODENOSCOPY (EGD) WITH PROPOFOL N/A 06/13/2018   Procedure: ESOPHAGOGASTRODUODENOSCOPY (EGD) WITH PROPOFOL;  Surgeon: [name], MD;  Location: [facility] ENDOSCOPY;  Service: Gastroenterology;  Laterality: N/A;  . WISDOM TOOTH EXTRACTION      FAMILY HISTORY: Family History  Problem Relation Age of Onset  . Leukemia Mother   . Colon cancer Father   . Diabetes Father   . Heart disease Father   . Diabetes Brother     ADVANCED DIRECTIVES (Y/N):  N  HEALTH MAINTENANCE: Social History   Tobacco Use  . Smoking status: Never Smoker  . Smokeless tobacco: Never Used  Substance Use Topics  . Alcohol use: Yes    Comment: rarely  . Drug use: No     Colonoscopy:  PAP:  Bone density:  Lipid panel:  No Known Allergies  Current Outpatient Medications  Medication Sig Dispense Refill  . acetaminophen (TYLENOL) 500 MG tablet Take 2,000 mg by mouth daily.    Marland Kitchen CANNABIDIOL PO Take 750 mg by mouth daily.    . Cholecalciferol (VITAMIN D3) 1000 units CAPS Take by mouth.    . Ibrutinib (IMBRUVICA) 420 MG TABS Take 420 mg by mouth daily. 28 tablet 5  . metFORMIN (GLUCOPHAGE) 1000 MG tablet Take by mouth.    Marland Kitchen omeprazole (PRILOSEC) 40 MG capsule Take 1 capsule (40 mg total) by mouth  2 (two) times daily. 60 capsule 0  . sucralfate (CARAFATE) 1 g tablet Take 1 tablet (1 g total) by mouth 4 (four) times daily. (Patient not taking: Reported on 08/12/2018) 120 tablet 1   No current facility-administered medications for this visit.     OBJECTIVE: Vitals:   08/12/18 0840    BP: (!) 148/85  Pulse: 66  Resp: 20  Temp: 98.1 F (36.7 C)     Body mass index is 27.67 kg/m.    ECOG FS:1 - Symptomatic but completely ambulatory  General: Well-developed, well-nourished, no acute distress. Eyes: Pink conjunctiva, anicteric sclera. HEENT: Normocephalic, moist mucous membranes. Lungs: Clear to auscultation bilaterally. Heart: Regular rate and rhythm. No rubs, murmurs, or gallops. Abdomen: Soft, nontender, nondistended. No organomegaly noted, normoactive bowel sounds. Musculoskeletal: No edema, cyanosis, or clubbing. Neuro: Alert, answering all questions appropriately. Cranial nerves grossly intact. Skin: No rashes or petechiae noted. Psych: Normal affect.  LAB RESULTS:  No results found for: NA, K, CL, CO2, GLUCOSE, BUN, CREATININE, CALCIUM, PROT, ALBUMIN, AST, ALT, ALKPHOS, BILITOT, GFRNONAA, GFRAA  Lab Results  Component Value Date   WBC 10.8 (H) 08/12/2018   NEUTROABS 3.7 08/12/2018   HGB 14.7 08/12/2018   HCT 44.0 08/12/2018   MCV 83.8 08/12/2018   PLT 151 08/12/2018     STUDIES: No results found.  ASSESSMENT: CLL.  PLAN:    1. CLL: Patient's white blood count is now within normal limits at 10.8. 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 PET scan also revealed diffuse bilateral cervical, axillary, retroperitoneal, iliac, and inguinal lymphadenopathy with a low level of metabolic uptake consistent with CLL. 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 Patient initiated Dolores Lory in approximately November 2017 with significant improvement of his B symptoms including his unusual neuropathy symptoms. Will continue current treatment until intolerable side effects or progression of disease.  Return to clinic in 6 months for repeat laboratory work and further evaluation.  2. 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   Continue follow-up with Dr. Manuella Ghazi and neurology for evaluation and treatment. 3. Difficulty swallowing: Appreciate GI input.  Patient had recent esophageal dilatation. 4. Disability: Given patient's persistent neurologic symptoms, he wishes to continue disability at this time.  I spent a total of 30 minutes face-to-face with the patient of which greater than 50% of the visit was spent in counseling and coordination of care as detailed above.   Patient expressed understanding and was in agreement with this plan. He also understands that He can call clinic at any time with any questions, concerns, or complaints.    Lloyd Huger, MD   08/12/2018 10:10 AM

## 2018-08-12 ENCOUNTER — Inpatient Hospital Stay: Payer: Managed Care, Other (non HMO)

## 2018-08-12 ENCOUNTER — Inpatient Hospital Stay: Payer: Managed Care, Other (non HMO) | Attending: Oncology | Admitting: Oncology

## 2018-08-12 ENCOUNTER — Encounter: Payer: Self-pay | Admitting: Oncology

## 2018-08-12 VITALS — BP 148/85 | HR 66 | Temp 98.1°F | Resp 20 | Wt 198.4 lb

## 2018-08-12 DIAGNOSIS — M21379 Foot drop, unspecified foot: Secondary | ICD-10-CM

## 2018-08-12 DIAGNOSIS — E119 Type 2 diabetes mellitus without complications: Secondary | ICD-10-CM

## 2018-08-12 DIAGNOSIS — Z800 Family history of malignant neoplasm of digestive organs: Secondary | ICD-10-CM | POA: Insufficient documentation

## 2018-08-12 DIAGNOSIS — R5381 Other malaise: Secondary | ICD-10-CM | POA: Diagnosis not present

## 2018-08-12 DIAGNOSIS — R531 Weakness: Secondary | ICD-10-CM | POA: Diagnosis not present

## 2018-08-12 DIAGNOSIS — F329 Major depressive disorder, single episode, unspecified: Secondary | ICD-10-CM | POA: Diagnosis not present

## 2018-08-12 DIAGNOSIS — R1110 Vomiting, unspecified: Secondary | ICD-10-CM | POA: Insufficient documentation

## 2018-08-12 DIAGNOSIS — R1310 Dysphagia, unspecified: Secondary | ICD-10-CM | POA: Diagnosis not present

## 2018-08-12 DIAGNOSIS — G629 Polyneuropathy, unspecified: Secondary | ICD-10-CM | POA: Diagnosis not present

## 2018-08-12 DIAGNOSIS — C9110 Chronic lymphocytic leukemia of B-cell type not having achieved remission: Secondary | ICD-10-CM

## 2018-08-12 DIAGNOSIS — Z7984 Long term (current) use of oral hypoglycemic drugs: Secondary | ICD-10-CM | POA: Insufficient documentation

## 2018-08-12 DIAGNOSIS — G4700 Insomnia, unspecified: Secondary | ICD-10-CM

## 2018-08-12 DIAGNOSIS — Z79899 Other long term (current) drug therapy: Secondary | ICD-10-CM | POA: Insufficient documentation

## 2018-08-12 DIAGNOSIS — R5383 Other fatigue: Secondary | ICD-10-CM | POA: Diagnosis not present

## 2018-08-12 LAB — CBC WITH DIFFERENTIAL/PLATELET
Basophils Absolute: 0.1 10*3/uL (ref 0–0.1)
Basophils Relative: 1 %
EOS PCT: 2 %
Eosinophils Absolute: 0.2 10*3/uL (ref 0–0.7)
HEMATOCRIT: 44 % (ref 40.0–52.0)
Hemoglobin: 14.7 g/dL (ref 13.0–18.0)
LYMPHS ABS: 6.2 10*3/uL — AB (ref 1.0–3.6)
LYMPHS PCT: 57 %
MCH: 27.9 pg (ref 26.0–34.0)
MCHC: 33.3 g/dL (ref 32.0–36.0)
MCV: 83.8 fL (ref 80.0–100.0)
Monocytes Absolute: 0.6 10*3/uL (ref 0.2–1.0)
Monocytes Relative: 6 %
NEUTROS ABS: 3.7 10*3/uL (ref 1.4–6.5)
Neutrophils Relative %: 34 %
PLATELETS: 151 10*3/uL (ref 150–440)
RBC: 5.25 MIL/uL (ref 4.40–5.90)
RDW: 14.2 % (ref 11.5–14.5)
WBC: 10.8 10*3/uL — AB (ref 3.8–10.6)

## 2018-08-12 NOTE — Progress Notes (Signed)
Patient reports difficulty with balance, generalized pain and increasing fatigue.

## 2018-08-16 MED FILL — IMBRUVICA 420 MG TAB: 420 | 28 days supply | Qty: 28 | Fill #5

## 2018-09-08 ENCOUNTER — Telehealth: Payer: Self-pay | Admitting: Pharmacist

## 2018-09-08 NOTE — Telephone Encounter (Signed)
Oral Chemotherapy Pharmacist Encounter  Follow-Up Form  Called patient today to follow up regarding patient's oral chemotherapy medication: Imbruvica (ibrutinib)  Original Start date of oral chemotherapy: 10/2016  Pt reports 0 tablets/doses of Ibrutinib missed so far this month.   Pt reports the following side effects: fatigue  Recent labs reviewed: CBC from 08/12/18  New medications?: None reported  Other Issues: Mr. Garriga reported occasional nose bleeds at night, the nose bleeds all stopped easily. He has also noticed what his gums occasionally bleeding. I reviewed his most recent CBC, his plt were on the lower end of normal and actually improved from the previous CBC. He stated he has an appt with Dr. Caryl Comes ([facility]) next week and they will be checking a CBC. He did not report any other type of bleeding. I instructed him to call the clinic if he has a nose bleed that takes a while to stop or sees blood in his urine of stool. He stated his understanding.   Patient knows to call the office with questions or concerns. Oral Oncology Clinic will continue to follow.  Darl Pikes, PharmD, BCPS, [facility] Hematology/Oncology Clinical Pharmacist [facility] [place] [facility] [phone]  09/08/2018 4:35 PM

## 2018-09-19 ENCOUNTER — Other Ambulatory Visit: Payer: Self-pay | Admitting: Oncology

## 2018-09-19 DIAGNOSIS — C9110 Chronic lymphocytic leukemia of B-cell type not having achieved remission: Secondary | ICD-10-CM

## 2018-09-21 MED FILL — IMBRUVICA 420 MG TAB: 420 | 28 days supply | Qty: 28 | Fill #0

## 2018-10-20 MED FILL — IMBRUVICA 420 MG TAB: 420 | 28 days supply | Qty: 28 | Fill #1

## 2018-10-24 ENCOUNTER — Telehealth: Payer: Self-pay | Admitting: Pharmacist

## 2018-10-24 DIAGNOSIS — C9110 Chronic lymphocytic leukemia of B-cell type not having achieved remission: Secondary | ICD-10-CM

## 2018-10-24 MED ORDER — CITALOPRAM HYDROBROMIDE 10 MG PO TABS: 10.0000 mg | ORAL_TABLET | Freq: Every day | ORAL | 2 refills | Status: DC

## 2018-10-24 NOTE — Telephone Encounter (Signed)
Oral Chemotherapy Pharmacist Encounter   Received notification from [facility] that Mr. Travis [name] mentioned to them that he was feeling a bit depressed. The pharmacy discussed with the patient and he was not in need to urgent intervention for his depression.   Depression discussed at his last OV and he was instructed to let the office know he wanted to pursue treatment to depression. Spoke with Dr. Grayland Ormond and he would like to start the patient on citalopram 20 mg.   No past hypoglycemia note on chart review (Care Everywhere). Recommend checking CMP and Mag at next OV. Prescription dose and frequency assessed.   Current medication list in Epic reviewed, a few DDIs with citalopram identified.  - Omeprazole/Cannabidiol: Omeprazole/Cannabidiol may increase the serum concentration of Citalopram. The recommendation is to limit citalopram dose to a maximum of 20 mg/day when used in combination with Omeprazole or Cannabidiol. - Ibrutinib: ibrutinib may enhance the antiplatelet properties of citalopram. Monitor CBC and s/sx of bleeding. - Metformin: citalopram may enhance the hypoglycemic effect of metformin. Monitor patient for s/sx of hypoglycemia.  Prescription has been e-scribed to his local pharmacy.    Darl Pikes, PharmD, BCPS, [facility] Hematology/Oncology Clinical Pharmacist [facility] [place] [facility] [phone]  10/24/2018 10:37 AM

## 2018-11-09 NOTE — Telephone Encounter (Signed)
Oral Chemotherapy Pharmacist Encounter   Spoke with [name] and let him know that a prescription was at the pharmacy for citalopram. He knows to call with any issue.   Darl Pikes, PharmD, BCPS, [facility] Hematology/Oncology Clinical Pharmacist [facility] [phone]  11/09/2018 3:38 PM

## 2018-11-09 NOTE — Telephone Encounter (Signed)
Oral Chemotherapy Pharmacist Encounter   Attempted again to call Mr. Hopwood to let him know about the citalopram prescription sent in for him. Left another LV for him to call me back.   Darl Pikes, PharmD, BCPS, [facility] Hematology/Oncology Clinical Pharmacist [facility] [place] [facility] [phone]  11/09/2018 3:16 PM

## 2018-11-15 MED FILL — IMBRUVICA 420 MG TAB: 420 | 28 days supply | Qty: 28 | Fill #2

## 2018-12-10 ENCOUNTER — Other Ambulatory Visit: Payer: Self-pay | Admitting: Oncology

## 2018-12-10 DIAGNOSIS — C9110 Chronic lymphocytic leukemia of B-cell type not having achieved remission: Secondary | ICD-10-CM

## 2018-12-14 MED FILL — IMBRUVICA 420 MG TAB: 420 | 28 days supply | Qty: 28 | Fill #3

## 2018-12-22 ENCOUNTER — Telehealth: Payer: Self-pay | Admitting: Pharmacy Technician

## 2018-12-22 NOTE — Telephone Encounter (Signed)
Oral Oncology Patient Advocate Encounter  Received notification from [employer] that prior authorization for Imbruvica is expiring soon.  PA submitted on CoverMyMeds Key [id number] Status is pending  Oral Oncology Clinic will continue to follow.  Travis [name] Patient [place] Phone [phone] Fax [phone] 12/22/2018 11:04 AM

## 2018-12-22 NOTE — Telephone Encounter (Signed)
Oral Oncology Patient Advocate Encounter  Prior Authorization for Kate Sable has been approved.    PA# [id number] Effective dates: 12/22/18 through 06/23/19  Oral Oncology Clinic will continue to follow.   [place] Patient [place] Phone [phone] Fax [phone] 12/22/2018 11:13 AM

## 2019-01-12 MED FILL — IMBRUVICA 420 MG TAB: 420 | 28 days supply | Qty: 28 | Fill #4

## 2019-02-03 NOTE — Progress Notes (Signed)
[facility]  [phone] [phone]  ID: Travis G Showers Jr. OB: 12/07/1951  MR#: [id number]  CSN#:[id number]  Patient Care Team: Klein, Bert J III, MD as PCP - General (Internal Medicine) Wohl, Darren, MD as Consulting Physician (Gastroenterology) Potter, Zachary E, MD as Referring Physician (Neurology)  CHIEF COMPLAINT: CLL.  INTERVAL HISTORY: Patient returns to clinic today for repeat laboratory work and routine 6-month follow-up.  He has noticed occasional nausea after taking his Imbruvica.  He is also noted increasing night sweats.  He has chronic joint pain.  His weakness and fatigue secondary to neuropathy is unchanged.  He otherwise is tolerating Imbruvica without significant side effects. He continues to have chronic neurologic problems and insomnia. He denies any recent fevers or illnesses. He denies any weight loss. He has a fair appetite. He has no chest pain or shortness of breath. He denies any nausea, vomiting, constipation, or diarrhea. He has no urinary complaints.  Patient offers no further specific complaints today.  REVIEW OF SYSTEMS:   Review of Systems  Constitutional: Positive for diaphoresis and malaise/fatigue. Negative for fever and weight loss.  Respiratory: Negative.  Negative for cough and shortness of breath.   Cardiovascular: Negative.  Negative for chest pain and leg swelling.  Gastrointestinal: Positive for nausea. Negative for abdominal pain, blood in stool, melena and vomiting.  Genitourinary: Negative.  Negative for dysuria.  Musculoskeletal: Negative.  Negative for back pain.  Skin: Negative.  Negative for rash.  Neurological: Positive for tingling, sensory change, focal weakness and weakness.  Psychiatric/Behavioral: Negative for depression. The patient has insomnia. The patient is not nervous/anxious.     As per HPI. Otherwise, a complete review of systems is negative.  PAST MEDICAL HISTORY: Past Medical  History:  Diagnosis Date  . Diabetes mellitus without complication (HCC)   . Leukemia (HCC)     PAST SURGICAL HISTORY: Past Surgical History:  Procedure Laterality Date  . COLONOSCOPY    . ESOPHAGOGASTRODUODENOSCOPY (EGD) WITH PROPOFOL N/A 03/14/2018   Procedure: ESOPHAGOGASTRODUODENOSCOPY (EGD) WITH PROPOFOL;  Surgeon: Anna, Kiran, MD;  Location: [facility] ENDOSCOPY;  Service: Gastroenterology;  Laterality: N/A;  . ESOPHAGOGASTRODUODENOSCOPY (EGD) WITH PROPOFOL N/A 06/13/2018   Procedure: ESOPHAGOGASTRODUODENOSCOPY (EGD) WITH PROPOFOL;  Surgeon: Anna, Kiran, MD;  Location: [facility] ENDOSCOPY;  Service: Gastroenterology;  Laterality: N/A;  . WISDOM TOOTH EXTRACTION      FAMILY HISTORY: Family History  Problem Relation Age of Onset  . Leukemia Mother   . Colon cancer Father   . Diabetes Father   . Heart disease Father   . Diabetes Brother     ADVANCED DIRECTIVES (Y/N):  N  HEALTH MAINTENANCE: Social History   Tobacco Use  . Smoking status: Never Smoker  . Smokeless tobacco: Never Used  Substance Use Topics  . Alcohol use: Yes    Comment: rarely  . Drug use: No     Colonoscopy:  PAP:  Bone density:  Lipid panel:  No Known Allergies  Current Outpatient Medications  Medication Sig Dispense Refill  . acetaminophen (TYLENOL) 500 MG tablet Take 2,000 mg by mouth daily.    . CANNABIDIOL PO Take 750 mg by mouth daily.    . Cholecalciferol (VITAMIN D3) 1000 units CAPS Take by mouth.    . IMBRUVICA 420 MG TABS TAKE 1 TABLET BY MOUTH DAILY. 28 tablet 5  . metFORMIN (GLUCOPHAGE) 1000 MG tablet Take by mouth.    . ondansetron (ZOFRAN-ODT) 4 MG disintegrating tablet Take 1 tablet by mouth.    .   venlafaxine XR (EFFEXOR-XR) 75 MG 24 hr capsule Take 1 capsule by mouth 1 day or 1 dose.     No current facility-administered medications for this visit.     OBJECTIVE: Vitals:   02/10/19 0839  BP: (!) 141/78  Pulse: 78  Temp: 98.3 F (36.8 C)     Body mass index is 27.09 kg/m.     ECOG FS:1 - Symptomatic but completely ambulatory  General: Well-developed, well-nourished, no acute distress. Eyes: Pink conjunctiva, anicteric sclera. HEENT: Normocephalic, moist mucous membranes, clear oropharnyx. Lungs: Clear to auscultation bilaterally. Heart: Regular rate and rhythm. No rubs, murmurs, or gallops. Abdomen: Soft, nontender, nondistended. No organomegaly noted, normoactive bowel sounds. Musculoskeletal: No edema, cyanosis, or clubbing. Neuro: Alert, answering all questions appropriately. Cranial nerves grossly intact. Skin: No rashes or petechiae noted. Psych: Normal affect. Lymphatics: No cervical, calvicular, axillary or inguinal LAD.  LAB RESULTS:  No results found for: NA, K, CL, CO2, GLUCOSE, BUN, CREATININE, CALCIUM, PROT, ALBUMIN, AST, ALT, ALKPHOS, BILITOT, GFRNONAA, GFRAA  Lab Results  Component Value Date   WBC 9.5 02/10/2019   NEUTROABS 3.7 02/10/2019   HGB 15.2 02/10/2019   HCT 45.5 02/10/2019   MCV 85.5 02/10/2019   PLT 183 02/10/2019     STUDIES: No results found.  ASSESSMENT: CLL.  PLAN:    1. CLL: Patient's white blood cell count continues to be within normal limits at 9.5. 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 PET scan also revealed diffuse bilateral cervical, axillary, retroperitoneal, iliac, and inguinal lymphadenopathy with a low level of metabolic uptake consistent with CLL. 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 Patient initiated Dolores Lory in approximately November 2017 with significant improvement of his B symptoms including his unusual neuropathy symptoms.  Continue Imbruvica until intolerable side effects or definitive progression of disease.  Given his increase in night sweats, will repeat imaging in the next 1 to 2 weeks.  Return to clinic after his imaging to discuss the results and further evaluation. 2. 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  Continue follow-up with Dr. Manuella Ghazi and neurology for evaluation and treatment. 3. Difficulty swallowing: Patient does not complain of this today.  Appreciate GI input.  Patient had recent esophageal dilatation. 4. Disability: Given patient's persistent neurologic symptoms, he wishes to continue disability at this time. 5.  Nausea: Possibly secondary to Imbruvica, monitor.  Patient expressed understanding and was in agreement with this plan. He also understands that He can call clinic at any time with any questions, concerns, or complaints.    Travis Huger, MD   02/10/2019 12:57 PM

## 2019-02-09 MED FILL — IMBRUVICA 420 MG TAB: 420 | 28 days supply | Qty: 28 | Fill #5

## 2019-02-10 ENCOUNTER — Other Ambulatory Visit: Payer: Self-pay

## 2019-02-10 ENCOUNTER — Inpatient Hospital Stay: Payer: Managed Care, Other (non HMO) | Admitting: Oncology

## 2019-02-10 ENCOUNTER — Inpatient Hospital Stay: Payer: Managed Care, Other (non HMO) | Attending: Oncology

## 2019-02-10 VITALS — BP 141/78 | HR 78 | Temp 98.3°F | Ht 71.0 in | Wt 194.2 lb

## 2019-02-10 DIAGNOSIS — G629 Polyneuropathy, unspecified: Secondary | ICD-10-CM

## 2019-02-10 DIAGNOSIS — C9110 Chronic lymphocytic leukemia of B-cell type not having achieved remission: Secondary | ICD-10-CM

## 2019-02-10 DIAGNOSIS — M2550 Pain in unspecified joint: Secondary | ICD-10-CM | POA: Insufficient documentation

## 2019-02-10 DIAGNOSIS — G8929 Other chronic pain: Secondary | ICD-10-CM | POA: Insufficient documentation

## 2019-02-10 DIAGNOSIS — R61 Generalized hyperhidrosis: Secondary | ICD-10-CM | POA: Insufficient documentation

## 2019-02-10 DIAGNOSIS — E119 Type 2 diabetes mellitus without complications: Secondary | ICD-10-CM | POA: Insufficient documentation

## 2019-02-10 DIAGNOSIS — Z7984 Long term (current) use of oral hypoglycemic drugs: Secondary | ICD-10-CM

## 2019-02-10 DIAGNOSIS — Z79899 Other long term (current) drug therapy: Secondary | ICD-10-CM

## 2019-02-10 DIAGNOSIS — R29818 Other symptoms and signs involving the nervous system: Secondary | ICD-10-CM

## 2019-02-10 LAB — CBC WITH DIFFERENTIAL/PLATELET
ABS IMMATURE GRANULOCYTES: 0.09 10*3/uL — AB (ref 0.00–0.07)
BASOS PCT: 1 %
Basophils Absolute: 0.1 10*3/uL (ref 0.0–0.1)
EOS ABS: 0.3 10*3/uL (ref 0.0–0.5)
Eosinophils Relative: 3 %
HCT: 45.5 % (ref 39.0–52.0)
Hemoglobin: 15.2 g/dL (ref 13.0–17.0)
Immature Granulocytes: 1 %
Lymphocytes Relative: 49 %
Lymphs Abs: 4.6 10*3/uL — ABNORMAL HIGH (ref 0.7–4.0)
MCH: 28.6 pg (ref 26.0–34.0)
MCHC: 33.4 g/dL (ref 30.0–36.0)
MCV: 85.5 fL (ref 80.0–100.0)
MONO ABS: 0.7 10*3/uL (ref 0.1–1.0)
Monocytes Relative: 7 %
Neutro Abs: 3.7 10*3/uL (ref 1.7–7.7)
Neutrophils Relative %: 39 %
PLATELETS: 183 10*3/uL (ref 150–400)
RBC: 5.32 MIL/uL (ref 4.22–5.81)
RDW: 13.2 % (ref 11.5–15.5)
WBC: 9.5 10*3/uL (ref 4.0–10.5)
nRBC: 0 % (ref 0.0–0.2)

## 2019-02-10 NOTE — Progress Notes (Signed)
Patient is here today to follow up on his CLL. Patient also stated that he started to have night sweats, joint pain on his hip and tiredness/fatigued.

## 2019-02-15 ENCOUNTER — Ambulatory Visit: Admission: RE | Admit: 2019-02-15 | Payer: Managed Care, Other (non HMO) | Source: Ambulatory Visit

## 2019-02-19 NOTE — Progress Notes (Deleted)
[name]  Telephone:([phone] [phone]  ID: Travis [name]. OB: [birth year]  MR#: [id number]  BDZ#:[id number]  Patient Care Team: Adin Hector, MD as PCP - General (Internal Medicine) Lucilla Lame, MD as Consulting Physician (Gastroenterology) Anabel Bene, MD as Referring Physician (Neurology)  CHIEF COMPLAINT: CLL.  INTERVAL HISTORY: Patient returns to clinic today for repeat laboratory work and routine 36-monthfollow-up.  He has noticed occasional nausea after taking his Imbruvica.  He is also noted increasing night sweats.  He has chronic joint pain.  His weakness and fatigue secondary to neuropathy is unchanged.  He otherwise is tolerating Imbruvica without significant side effects. He continues to have chronic neurologic problems and insomnia. He denies any recent fevers or illnesses. He denies any weight loss. He has a fair appetite. He has no chest pain or shortness of breath. He denies any nausea, vomiting, constipation, or diarrhea. He has no urinary complaints.  Patient offers no further specific complaints today.  REVIEW OF SYSTEMS:   Review of Systems  Constitutional: Positive for diaphoresis and malaise/fatigue. Negative for fever and weight loss.  Respiratory: Negative.  Negative for cough and shortness of breath.   Cardiovascular: Negative.  Negative for chest pain and leg swelling.  Gastrointestinal: Positive for nausea. Negative for abdominal pain, blood in stool, melena and vomiting.  Genitourinary: Negative.  Negative for dysuria.  Musculoskeletal: Negative.  Negative for back pain.  Skin: Negative.  Negative for rash.  Neurological: Positive for tingling, sensory change, focal weakness and weakness.  Psychiatric/Behavioral: Negative for depression. The patient has insomnia. The patient is not nervous/anxious.     As per HPI. Otherwise, a complete review of systems is negative.  PAST MEDICAL HISTORY: Past Medical  History:  Diagnosis Date  . Diabetes mellitus without complication ([place]   . Leukemia ([place]     PAST SURGICAL HISTORY: Past Surgical History:  Procedure Laterality Date  . COLONOSCOPY    . ESOPHAGOGASTRODUODENOSCOPY (EGD) WITH PROPOFOL N/A 03/14/2018   Procedure: ESOPHAGOGASTRODUODENOSCOPY (EGD) WITH PROPOFOL;  Surgeon: AJonathon Bellows MD;  [facility]  Service: Gastroenterology;  Laterality: N/A;  . ESOPHAGOGASTRODUODENOSCOPY (EGD) WITH PROPOFOL N/A 06/13/2018   Procedure: ESOPHAGOGASTRODUODENOSCOPY (EGD) WITH PROPOFOL;  Surgeon: AJonathon Bellows MD;  [facility];  Service: Gastroenterology;  Laterality: N/A;  . WISDOM TOOTH EXTRACTION      FAMILY HISTORY: Family History  Problem Relation Age of Onset  . Leukemia Mother   . Colon cancer Father   . Diabetes Father   . Heart disease Father   . Diabetes Brother     ADVANCED DIRECTIVES (Y/N):  N  HEALTH MAINTENANCE: Social History   Tobacco Use  . Smoking status: Never Smoker  . Smokeless tobacco: Never Used  Substance Use Topics  . Alcohol use: Yes    Comment: rarely  . Drug use: No     Colonoscopy:  PAP:  Bone density:  Lipid panel:  No Known Allergies  Current Outpatient Medications  Medication Sig Dispense Refill  . acetaminophen (TYLENOL) 500 MG tablet Take 2,000 mg by mouth daily.    .Marland KitchenCANNABIDIOL PO Take 750 mg by mouth daily.    . Cholecalciferol (VITAMIN D3) 1000 units CAPS Take by mouth.    . IMBRUVICA 420 MG TABS TAKE 1 TABLET BY MOUTH DAILY. 28 tablet 5  . metFORMIN (GLUCOPHAGE) 1000 MG tablet Take by mouth.    . ondansetron (ZOFRAN-ODT) 4 MG disintegrating tablet Take 1 tablet by mouth.    .Marland Kitchen  venlafaxine XR (EFFEXOR-XR) 75 MG 24 hr capsule Take 1 capsule by mouth 1 day or 1 dose.     No current facility-administered medications for this visit.     OBJECTIVE: There were no vitals filed for this visit.   There is no height or weight on file to calculate BMI.    ECOG FS:1 -  Symptomatic but completely ambulatory  General: Well-developed, well-nourished, no acute distress. Eyes: Pink conjunctiva, anicteric sclera. HEENT: Normocephalic, moist mucous membranes, clear oropharnyx. Lungs: Clear to auscultation bilaterally. Heart: Regular rate and rhythm. No rubs, murmurs, or gallops. Abdomen: Soft, nontender, nondistended. No organomegaly noted, normoactive bowel sounds. Musculoskeletal: No edema, cyanosis, or clubbing. Neuro: Alert, answering all questions appropriately. Cranial nerves grossly intact. Skin: No rashes or petechiae noted. Psych: Normal affect. Lymphatics: No cervical, calvicular, axillary or inguinal LAD.  LAB RESULTS:  No results found for: NA, K, CL, CO2, GLUCOSE, BUN, CREATININE, CALCIUM, PROT, ALBUMIN, AST, ALT, ALKPHOS, BILITOT, GFRNONAA, GFRAA  Lab Results  Component Value Date   WBC 9.5 02/10/2019   NEUTROABS 3.7 02/10/2019   HGB 15.2 02/10/2019   HCT 45.5 02/10/2019   MCV 85.5 02/10/2019   PLT 183 02/10/2019     STUDIES: No results found.  ASSESSMENT: CLL.  PLAN:    1. CLL: Patient's white blood cell count continues to be within normal limits at 9.5. 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 PET scan also revealed diffuse bilateral cervical, axillary, retroperitoneal, iliac, and inguinal lymphadenopathy with a low level of metabolic uptake consistent with CLL. 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 Patient initiated Dolores Lory in approximately November 2017 with significant improvement of his B symptoms including his unusual neuropathy symptoms.  Continue Imbruvica until intolerable side effects or definitive progression of disease.  Given his increase in night sweats, will repeat imaging in the next 1 to 2 weeks.  Return to clinic after his imaging to discuss the results and further evaluation. 2. 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  Continue follow-up with Dr. Manuella Ghazi and neurology for evaluation and treatment. 3. Difficulty swallowing: Patient does not complain of this today.  Appreciate GI input.  Patient had recent esophageal dilatation. 4. Disability: Given patient's persistent neurologic symptoms, he wishes to continue disability at this time. 5.  Nausea: Possibly secondary to Imbruvica, monitor.  Patient expressed understanding and was in agreement with this plan. He also understands that He can call clinic at any time with any questions, concerns, or complaints.    Lloyd Huger, MD   02/19/2019 11:16 PM

## 2019-02-21 ENCOUNTER — Telehealth: Payer: Self-pay | Admitting: *Deleted

## 2019-02-21 NOTE — Telephone Encounter (Signed)
This patient was contacted hours ago and the appts were canceled. Thank you

## 2019-02-21 NOTE — Telephone Encounter (Signed)
Please reschedule appt after ct is completed.

## 2019-02-21 NOTE — Telephone Encounter (Signed)
Patient called reporting that his CT scan has been cancelled for insurance auth reasons, but he has follow up appointment tomorrow for results and the CT has not been rescheduled at this time. Please advise and call him back at [phone]

## 2019-02-21 NOTE — Telephone Encounter (Signed)
Brooke, please handle this

## 2019-02-22 ENCOUNTER — Inpatient Hospital Stay: Payer: Self-pay | Admitting: Oncology

## 2019-03-03 ENCOUNTER — Other Ambulatory Visit: Payer: Self-pay | Admitting: Oncology

## 2019-03-03 DIAGNOSIS — C9110 Chronic lymphocytic leukemia of B-cell type not having achieved remission: Secondary | ICD-10-CM

## 2019-03-03 NOTE — Telephone Encounter (Signed)
)     Ref Range & Units 3wk ago (02/10/19) 58mo ago (08/12/18) 27yr ago (02/11/18) 96yr ago (11/09/17) 24yr ago (08/06/17) 59yr ago (06/25/17) 47yr ago (05/14/17)  WBC 4.0 - 10.5 K/uL 9.5  10.8High  R 16.5High  R 26.3High  R 47.6High  R 51.9High Panic  CM 62.9High Panic  R, CM  RBC 4.22 - 5.81 MIL/uL 5.32  5.25 R 5.63 R 5.52 R 5.46 R 5.37 R, CM 5.47 R  Hemoglobin 13.0 - 17.0 g/dL 15.2  14.7 R 15.7 R 15.2 R 14.8 R 14.5 R 14.3 R  HCT 39.0 - 52.0 % 45.5  44.0 R 46.7 R 46.3 R 45.8 R 44.4 R 44.8 R  MCV 80.0 - 100.0 fL 85.5  83.8  83.0  83.9  83.9  82.8  81.9   MCH 26.0 - 34.0 pg 28.6  27.9  27.8  27.5  27.2  27.0  26.2   MCHC 30.0 - 36.0 g/dL 33.4  33.3 R 33.5 R 32.8 R 32.4 R 32.6 R 32.0 R  RDW 11.5 - 15.5 % 13.2  14.2 R 13.5 R 14.0 R 14.4 R 14.9High  R 14.7High  R  Platelets 150 - 400 K/uL 183  151 R 137Low  R 144Low  R 158 R 139Low  R 147Low  R  nRBC 0.0 - 0.2 % 0.0         Neutrophils Relative % % 39  34  24  18  11  10  8    Neutro Abs 1.7 - 7.7 K/uL 3.7  3.7 R 4.0 R 4.6 R 5.2 R 5.2 R 5.0 R  Lymphocytes Relative % 49  57  70  77  86  87  88   Lymphs Abs 0.7 - 4.0 K/uL 4.6High   6.2High  R 11.6High  R 20.6High  R 41.2High  R 45.4High  R 56.3High  R  Monocytes Relative % 7  6  4  3  2  2  2    Monocytes Absolute 0.1 - 1.0 K/uL 0.7  0.6 R 0.6 R 0.7 R 0.8 R 0.9 R 1.0 R  Eosinophils Relative % 3  2  1  1   0  1  1   Eosinophils Absolute 0.0 - 0.5 K/uL 0.3  0.2 R 0.2 R 0.2 R 0.1 R 0.3 R 0.3 R  Basophils Relative % 1  1  1  1  1   0  1   Basophils Absolute 0.0 - 0.1 K/uL 0.1  0.1 R, CM 0.1 R, CM 0.1 R 0.2High  R 0.1 R 0.3High  R  Immature Granulocytes % 1         Abs Immature Granulocytes 0.00 - 0.07 K/uL 0.09High          Comment: Performed at [facility], [address], [place], [place] [address]  Resulting Agency  [facility] CL[facility] LAB [facility] CLIN LAB [place] [facility] [place] [facility] [place] [facility] [place] [facility] LAB      Specimen Collected: 02/10/19 08:07  Last Resulted: 02/10/19 08:16

## 2019-03-07 MED FILL — IMBRUVICA 420 MG TAB: 420 | 28 days supply | Qty: 28 | Fill #0

## 2019-04-03 MED FILL — IMBRUVICA 420 MG TAB: 420 | 28 days supply | Qty: 28 | Fill #1

## 2019-05-01 MED FILL — IMBRUVICA 420 MG TAB: 420 | 28 days supply | Qty: 28 | Fill #2

## 2019-05-29 MED FILL — IMBRUVICA 420 MG TAB: 420 | 28 days supply | Qty: 28 | Fill #3

## 2019-05-30 DIAGNOSIS — E538 Deficiency of other specified B group vitamins: Secondary | ICD-10-CM | POA: Insufficient documentation

## 2019-06-20 ENCOUNTER — Telehealth: Payer: Self-pay | Admitting: Pharmacy Technician

## 2019-06-20 NOTE — Telephone Encounter (Signed)
Oral Oncology Patient Advocate Encounter  Prior Authorization for Travis [name] has been approved.    PA# [id number] Effective dates: 06/20/2019 through 12/20/2019  Patients co-pay is $100.00.  After copay card patient pays $10.00.  Oral Oncology Clinic will continue to follow.   [place] Patient [place] Phone [phone] Fax [phone] 06/20/2019 11:57 AM

## 2019-06-20 NOTE — Telephone Encounter (Signed)
Oral Oncology Patient Advocate Encounter  Received notification from [employer] that prior authorization for Imbruvica is required.  PA submitted on CoverMyMeds Key [id number]  Status is pending  Oral Oncology Clinic will continue to follow.  [place] Patient [place] Phone [phone] Fax [phone] 06/20/2019 11:54 AM

## 2019-06-27 MED FILL — IMBRUVICA 420 MG TAB: 420 | 28 days supply | Qty: 28 | Fill #4

## 2019-07-27 MED FILL — IMBRUVICA 420 MG TAB: 420 | 28 days supply | Qty: 28 | Fill #5

## 2019-08-21 ENCOUNTER — Other Ambulatory Visit: Payer: Self-pay | Admitting: Oncology

## 2019-08-21 DIAGNOSIS — C9110 Chronic lymphocytic leukemia of B-cell type not having achieved remission: Secondary | ICD-10-CM

## 2019-08-23 MED FILL — IMBRUVICA 420 MG TAB: 420 | 28 days supply | Qty: 28 | Fill #0

## 2019-09-25 MED FILL — IMBRUVICA 420 MG TAB: 420 | 28 days supply | Qty: 28 | Fill #1

## 2019-10-19 MED FILL — IMBRUVICA 420 MG TAB: 420 | 28 days supply | Qty: 28 | Fill #2

## 2019-10-22 NOTE — Progress Notes (Signed)
[place]  Telephone:([phone] [phone]  ID: [name]. OB: [birth year]  MR#: [id number]  AOZ#:[id number]  Patient Care Team: Adin Hector, MD as PCP - General (Internal Medicine) Lucilla Lame, MD as Consulting Physician (Gastroenterology) Anabel Bene, MD as Referring Physician (Neurology)  CHIEF COMPLAINT: CLL.  INTERVAL HISTORY: Patient was last evaluated in clinic in February 2020.  He returns today for laboratory work and routine evaluation.  He is tolerating Imbruvica well without significant side effects.  He has chronic joint pain.  His weakness and fatigue secondary to neuropathy is unchanged. He continues to have chronic neurologic problems that are unchanged as well. He denies any recent fevers or illnesses. He denies any weight loss. He has a fair appetite. He has no chest pain or shortness of breath. He denies any nausea, vomiting, constipation, or diarrhea. He has no urinary complaints.  Patient is at his baseline offers no further specific complaints today.  REVIEW OF SYSTEMS:   Review of Systems  Constitutional: Positive for malaise/fatigue. Negative for diaphoresis, fever and weight loss.  Respiratory: Negative.  Negative for cough and shortness of breath.   Cardiovascular: Negative.  Negative for chest pain and leg swelling.  Gastrointestinal: Negative for abdominal pain, blood in stool, melena, nausea and vomiting.  Genitourinary: Negative.  Negative for dysuria.  Musculoskeletal: Negative.  Negative for back pain.  Skin: Negative.  Negative for rash.  Neurological: Positive for tingling, sensory change, focal weakness and weakness.  Psychiatric/Behavioral: Negative.  Negative for depression. The patient is not nervous/anxious and does not have insomnia.     As per HPI. Otherwise, a complete review of systems is negative.  PAST MEDICAL HISTORY: Past Medical History:  Diagnosis Date  . Diabetes mellitus without  complication ([place])   . Leukemia ([place])     PAST SURGICAL HISTORY: Past Surgical History:  Procedure Laterality Date  . COLONOSCOPY    . ESOPHAGOGASTRODUODENOSCOPY (EGD) WITH PROPOFOL N/A 03/14/2018   Procedure: ESOPHAGOGASTRODUODENOSCOPY (EGD) WITH PROPOFOL;  Surgeon: [name], MD;  Location[facility] ENDOSCOPY;  Service: Gastroenterology;  Laterality: N/A;  . ESOPHAGOGASTRODUODENOSCOPY (EGD) WITH PROPOFOL N/A 06/13/2018   Procedure: ESOPHAGOGASTRODUODENOSCOPY (EGD) WITH PROPOFOL;  Surgeon: [name], MD;  Location: [facility] ENDOSCOPY;  Service: Gastroenterology;  Laterality: N/A;  . WISDOM TOOTH EXTRACTION      FAMILY HISTORY: Family History  Problem Relation Age of Onset  . Leukemia Mother   . Colon cancer Father   . Diabetes Father   . Heart disease Father   . Diabetes Brother     ADVANCED DIRECTIVES (Y/N):  N  HEALTH MAINTENANCE: Social History   Tobacco Use  . Smoking status: Never Smoker  . Smokeless tobacco: Never Used  Substance Use Topics  . Alcohol use: Yes    Comment: rarely  . Drug use: No     Colonoscopy:  PAP:  Bone density:  Lipid panel:  No Known Allergies  Current Outpatient Medications  Medication Sig Dispense Refill  . acetaminophen (TYLENOL) 500 MG tablet Take 2,000 mg by mouth daily.    . Cholecalciferol (VITAMIN D3) 1000 units CAPS Take by mouth.    . citalopram (CELEXA) 20 MG tablet Take 40 mg by mouth.    . IMBRUVICA 420 MG TABS TAKE 1 TABLET BY MOUTH DAILY. 28 tablet 5  . metFORMIN (GLUCOPHAGE) 1000 MG tablet Take by mouth.    . ondansetron (ZOFRAN-ODT) 4 MG disintegrating tablet Take 1 tablet by mouth.     No current  facility-administered medications for this visit.     OBJECTIVE: Vitals:   10/26/19 0947  BP: (!) 153/90  Pulse: 64  Resp: 18  Temp: (!) 97.5 F (36.4 C)  SpO2: 100%     Body mass index is 28.49 kg/m.    ECOG FS:1 - Symptomatic but completely ambulatory  General: Well-developed, well-nourished, no acute distress.  Eyes: Pink conjunctiva, anicteric sclera. HEENT: Normocephalic, moist mucous membranes. Lungs: Clear to auscultation bilaterally. Heart: Regular rate and rhythm. No rubs, murmurs, or gallops. Abdomen: Soft, nontender, nondistended. No organomegaly noted, normoactive bowel sounds. Musculoskeletal: No edema, cyanosis, or clubbing. Neuro: Alert, answering all questions appropriately. Cranial nerves grossly intact. Skin: No rashes or petechiae noted. Psych: Normal affect.  LAB RESULTS:  No results found for: NA, K, CL, CO2, GLUCOSE, BUN, CREATININE, CALCIUM, PROT, ALBUMIN, AST, ALT, ALKPHOS, BILITOT, GFRNONAA, GFRAA  Lab Results  Component Value Date   WBC 7.4 10/26/2019   NEUTROABS 3.6 10/26/2019   HGB 14.6 10/26/2019   HCT 43.6 10/26/2019   MCV 83.0 10/26/2019   PLT 146 (L) 10/26/2019     STUDIES: No results found.  ASSESSMENT: CLL.  PLAN:    1. CLL: Patient's white blood cell count continues to be within normal limits at 7.4. 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 PET scan also revealed diffuse bilateral cervical, axillary, retroperitoneal, iliac, and inguinal lymphadenopathy with a low level of metabolic uptake consistent with CLL. 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 Patient initiated Dolores Lory in approximately November 2017 with significant improvement of his B symptoms including his unusual neuropathy symptoms.  Continue Imbruvica until intolerable side effects or definitive progression of disease.  Return to clinic in 6 months with repeat laboratory can further evaluation.   2. 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  Continue follow-up with neurology for evaluation and treatment. 3. Difficulty swallowing: Patient does not complain of this today.  Appreciate GI input.  Patient had esophageal dilation on June 13, 2018. 4.  Disability: Given patient's persistent neurologic symptoms, he wishes to continue disability at this time.  I spent a total of 30 minutes face-to-face with the patient of which greater than 50% of the visit was spent in counseling and coordination of care as detailed above.   Patient expressed understanding and was in agreement with this plan. He also understands that He can call clinic at any time with any questions, concerns, or complaints.    Lloyd Huger, MD   10/27/2019 6:57 AM

## 2019-10-25 ENCOUNTER — Other Ambulatory Visit: Payer: Self-pay

## 2019-10-25 DIAGNOSIS — C9110 Chronic lymphocytic leukemia of B-cell type not having achieved remission: Secondary | ICD-10-CM

## 2019-10-26 ENCOUNTER — Inpatient Hospital Stay (HOSPITAL_BASED_OUTPATIENT_CLINIC_OR_DEPARTMENT_OTHER): Payer: Managed Care, Other (non HMO) | Admitting: Oncology

## 2019-10-26 ENCOUNTER — Inpatient Hospital Stay: Payer: Managed Care, Other (non HMO) | Attending: Oncology

## 2019-10-26 ENCOUNTER — Other Ambulatory Visit: Payer: Self-pay

## 2019-10-26 ENCOUNTER — Encounter: Payer: Self-pay | Admitting: Oncology

## 2019-10-26 VITALS — BP 153/90 | HR 64 | Temp 97.5°F | Resp 18 | Wt 204.3 lb

## 2019-10-26 DIAGNOSIS — C9110 Chronic lymphocytic leukemia of B-cell type not having achieved remission: Secondary | ICD-10-CM | POA: Diagnosis present

## 2019-10-26 DIAGNOSIS — Z7984 Long term (current) use of oral hypoglycemic drugs: Secondary | ICD-10-CM | POA: Insufficient documentation

## 2019-10-26 DIAGNOSIS — E119 Type 2 diabetes mellitus without complications: Secondary | ICD-10-CM | POA: Insufficient documentation

## 2019-10-26 DIAGNOSIS — Z79899 Other long term (current) drug therapy: Secondary | ICD-10-CM | POA: Insufficient documentation

## 2019-10-26 DIAGNOSIS — G629 Polyneuropathy, unspecified: Secondary | ICD-10-CM | POA: Insufficient documentation

## 2019-10-26 LAB — CBC WITH DIFFERENTIAL/PLATELET
Abs Immature Granulocytes: 0.16 10*3/uL — ABNORMAL HIGH (ref 0.00–0.07)
Basophils Absolute: 0.1 10*3/uL (ref 0.0–0.1)
Basophils Relative: 1 %
Eosinophils Absolute: 0.2 10*3/uL (ref 0.0–0.5)
Eosinophils Relative: 3 %
HCT: 43.6 % (ref 39.0–52.0)
Hemoglobin: 14.6 g/dL (ref 13.0–17.0)
Immature Granulocytes: 2 %
Lymphocytes Relative: 36 %
Lymphs Abs: 2.7 10*3/uL (ref 0.7–4.0)
MCH: 27.8 pg (ref 26.0–34.0)
MCHC: 33.5 g/dL (ref 30.0–36.0)
MCV: 83 fL (ref 80.0–100.0)
Monocytes Absolute: 0.7 10*3/uL (ref 0.1–1.0)
Monocytes Relative: 9 %
Neutro Abs: 3.6 10*3/uL (ref 1.7–7.7)
Neutrophils Relative %: 49 %
Platelets: 146 10*3/uL — ABNORMAL LOW (ref 150–400)
RBC: 5.25 MIL/uL (ref 4.22–5.81)
RDW: 12.7 % (ref 11.5–15.5)
WBC: 7.4 10*3/uL (ref 4.0–10.5)
nRBC: 0 % (ref 0.0–0.2)

## 2019-10-26 NOTE — Progress Notes (Signed)
Patient is here for follow up, he is doing well mentions he has trouble sleeping has nausea and vomiting

## 2019-10-30 ENCOUNTER — Telehealth: Payer: Self-pay | Admitting: Pharmacy Technician

## 2019-10-30 NOTE — Telephone Encounter (Signed)
Oral Oncology Patient Advocate Encounter  Prior Authorization for Kate Sable has been approved.    PA# [id number] Effective dates: 10/30/2019 through 10/29/2020  Patients co-pay is $3545.57.  Patient has a copay card to reduce his copay.  Oral Oncology Clinic will continue to follow.   [place] Patient [place] Phone [phone] Fax [phone] 10/30/2019 2:12 PM

## 2019-10-30 NOTE — Telephone Encounter (Signed)
Oral Oncology Patient Advocate Encounter  Patient called to let us know of a change in insurance on 10/26/2019.  Insurance information was not updated when I ran an eligibility check that day.  Today I ran an eligibility check and the new pharmacy insurance information is below:  BIN:  [id number] PCN:  ADV  ID:  [id number]  Received notification from [place] that prior authorization for Imbruvica is required.  PA submitted on CoverMyMeds Key [id number]  Status is pending  Oral Oncology Clinic will continue to follow.  [place] Patient [place] Phone [phone] Fax [phone] 10/30/2019 1:15 PM

## 2019-11-17 MED FILL — IMBRUVICA 420 MG TAB: 420 | 28 days supply | Qty: 28 | Fill #3

## 2019-12-18 MED FILL — IMBRUVICA 420 MG TAB: 420 | 28 days supply | Qty: 28 | Fill #4

## 2020-01-15 ENCOUNTER — Telehealth: Payer: Self-pay | Admitting: Pharmacy Technician

## 2020-01-15 MED FILL — IMBRUVICA 420 MG TAB: 420 | 28 days supply | Qty: 28 | Fill #5

## 2020-01-15 NOTE — Telephone Encounter (Signed)
Oral Oncology Patient Advocate Encounter   Was successful in obtaining a copay card for [place].  This copay card will make the patients copay $10.  I have spoken with the patient.    The billing information is as follows and has been shared with [place].   RxBin: [id number] PCN: Loyalty Member ID: [id number] Group ID: [id number]   [place] Patient [place] Phone [phone] Fax [phone] 01/15/2020 8:41 AM

## 2020-02-07 ENCOUNTER — Other Ambulatory Visit: Payer: Self-pay | Admitting: Oncology

## 2020-02-07 DIAGNOSIS — C9110 Chronic lymphocytic leukemia of B-cell type not having achieved remission: Secondary | ICD-10-CM

## 2020-02-12 MED FILL — IMBRUVICA 420 MG TAB: 420 | 28 days supply | Qty: 28 | Fill #0

## 2020-02-17 ENCOUNTER — Ambulatory Visit: Payer: BC Managed Care – PPO | Attending: Internal Medicine

## 2020-02-17 DIAGNOSIS — Z23 Encounter for immunization: Secondary | ICD-10-CM | POA: Insufficient documentation

## 2020-02-17 NOTE — Progress Notes (Signed)
   [id number] Vaccination Clinic  Name:  Travis [name].    MRN: [id number] DOB: 17-Sep-1951  02/17/2020  Travis [name] was observed post Covid-19 immunization for 15 minutes without incidence. He was provided with Vaccine Information Sheet and instruction to access the V-Safe system.   Travis [name] was instructed to call 911 with any severe reactions post vaccine: Marland Kitchen Difficulty breathing  . Swelling of your face and throat  . A fast heartbeat  . A bad rash all over your body  . Dizziness and weakness    Immunizations Administered    Name Date Dose VIS Date Route   Pfizer COVID-19 Vaccine 02/17/2020  8:18 AM 0.3 mL 12/08/2019 Intramuscular   Manufacturer: [place]   Lot: [id number]   [place]: [id number]

## 2020-03-07 MED FILL — IMBRUVICA 420 MG TAB: 420 | 28 days supply | Qty: 28 | Fill #1

## 2020-03-12 ENCOUNTER — Ambulatory Visit: Payer: BC Managed Care – PPO | Attending: Internal Medicine

## 2020-03-12 DIAGNOSIS — Z23 Encounter for immunization: Secondary | ICD-10-CM

## 2020-03-12 NOTE — Progress Notes (Signed)
   [id number] Vaccination Clinic  Name:  Travis [name].    MRN: [id number] DOB: [birth year]  03/12/2020  Travis [name] was observed post Covid-19 immunization for 15 minutes without incident. He was provided with Vaccine Information Sheet and instruction to access the V-Safe system.   Travis [name] was instructed to call 911 with any severe reactions post vaccine: Marland Kitchen Difficulty breathing  . Swelling of face and throat  . A fast heartbeat  . A bad rash all over body  . Dizziness and weakness   Immunizations Administered    Name Date Dose VIS Date Route   Pfizer COVID-19 Vaccine 03/12/2020  8:38 AM 0.3 mL 12/08/2019 Intramuscular   Manufacturer: [place]   Lot: [id number]   [place]: [id number]

## 2020-04-05 MED FILL — IMBRUVICA 420 MG TAB: 420 | 28 days supply | Qty: 28 | Fill #2

## 2020-04-26 ENCOUNTER — Encounter: Payer: Self-pay | Admitting: Oncology

## 2020-04-26 ENCOUNTER — Other Ambulatory Visit: Payer: Self-pay

## 2020-04-26 DIAGNOSIS — C9110 Chronic lymphocytic leukemia of B-cell type not having achieved remission: Secondary | ICD-10-CM

## 2020-04-26 NOTE — Progress Notes (Signed)
Called patient and did pre assessment. He denies any pain. States he is having some trouble sleeping. Did admit to some feelings of hopelessness but was not sure if he needed was ready to speak with anyone. He denies any other concerns at this time.

## 2020-04-27 NOTE — Progress Notes (Signed)
[place]  Telephone:([phone] [phone]  ID: Travis [name]. OB: Jul 05, 1951  MR#: [id number]  ZYS#:[id number]  Patient Care Team: Adin Hector, MD as PCP - General (Internal Medicine) Lucilla Lame, MD as Consulting Physician (Gastroenterology) Anabel Bene, MD as Referring Physician (Neurology)  CHIEF COMPLAINT: CLL.  INTERVAL HISTORY: Patient was last evaluated in clinic in October 2020.  He returns today for repeat laboratory work and routine evaluation.  He continues to tolerate Imbruvica without significant side effects.  He has chronic joint pain.  His weakness and fatigue secondary to neuropathy is unchanged. He continues to have chronic neurologic problems that are unchanged as well. He denies any recent fevers or illnesses. He denies any weight loss. He has a fair appetite.  He denies any chest pain, shortness of breath, cough, or hemoptysis.  He denies any nausea, vomiting, constipation, or diarrhea. He has no urinary complaints.  Patient offers no further specific complaints today.  REVIEW OF SYSTEMS:   Review of Systems  Constitutional: Positive for malaise/fatigue. Negative for diaphoresis, fever and weight loss.  Respiratory: Negative.  Negative for cough and shortness of breath.   Cardiovascular: Negative.  Negative for chest pain and leg swelling.  Gastrointestinal: Negative for abdominal pain, blood in stool, melena, nausea and vomiting.  Genitourinary: Negative.  Negative for dysuria.  Musculoskeletal: Negative.  Negative for back pain.  Skin: Negative.  Negative for rash.  Neurological: Positive for tingling, sensory change, focal weakness and weakness.  Psychiatric/Behavioral: Negative.  Negative for depression. The patient is not nervous/anxious and does not have insomnia.     As per HPI. Otherwise, a complete review of systems is negative.  PAST MEDICAL HISTORY: Past Medical History:  Diagnosis Date  . Diabetes  mellitus without complication ([place])   . Leukemia ([place])     PAST SURGICAL HISTORY: Past Surgical History:  Procedure Laterality Date  . COLONOSCOPY    . ESOPHAGOGASTRODUODENOSCOPY (EGD) WITH PROPOFOL N/A 03/14/2018   Procedure: ESOPHAGOGASTRODUODENOSCOPY (EGD) WITH PROPOFOL;  Surgeon: [name], MD;  [facility]COPY;  Service: Gastroenterology;  Laterality: N/A;  . ESOPHAGOGASTRODUODENOSCOPY (EGD) WITH PROPOFOL N/A 06/13/2018   Procedure: ESOPHAGOGASTRODUODENOSCOPY (EGD) WITH PROPOFOL;  Surgeon: [name], MD;  Location: [facility] ENDOSCOPY;  Service: Gastroenterology;  Laterality: N/A;  . WISDOM TOOTH EXTRACTION      FAMILY HISTORY: Family History  Problem Relation Age of Onset  . Leukemia Mother   . Colon cancer Father   . Diabetes Father   . Heart disease Father   . Diabetes Brother     ADVANCED DIRECTIVES (Y/N):  N  HEALTH MAINTENANCE: Social History   Tobacco Use  . Smoking status: Never Smoker  . Smokeless tobacco: Never Used  Substance Use Topics  . Alcohol use: Yes    Comment: rarely  . Drug use: No     Colonoscopy:  PAP:  Bone density:  Lipid panel:  No Known Allergies  Current Outpatient Medications  Medication Sig Dispense Refill  . acetaminophen (TYLENOL) 500 MG tablet Take 2,000 mg by mouth as needed.     . Cholecalciferol (VITAMIN D3) 1000 units CAPS Take by mouth.    . citalopram (CELEXA) 20 MG tablet Take 40 mg by mouth.    . IMBRUVICA 420 MG TABS TAKE 1 TABLET BY MOUTH DAILY. 28 tablet 5  . ondansetron (ZOFRAN-ODT) 4 MG disintegrating tablet Take 1 tablet by mouth as needed.     . metFORMIN (GLUCOPHAGE) 1000 MG tablet Take by mouth.  No current facility-administered medications for this visit.    OBJECTIVE: Vitals:   04/29/20 1026  BP: 122/81  Pulse: 63  Resp: 20  Temp: (!) 96 F (35.6 C)  SpO2: 96%     Body mass index is 27.02 kg/m.    ECOG FS:1 - Symptomatic but completely ambulatory  General: Well-developed,  well-nourished, no acute distress. Eyes: Pink conjunctiva, anicteric sclera. HEENT: Normocephalic, moist mucous membranes. Lungs: No audible wheezing or coughing. Heart: Regular rate and rhythm. Abdomen: Soft, nontender, no obvious distention. Musculoskeletal: No edema, cyanosis, or clubbing. Neuro: Alert, answering all questions appropriately. Cranial nerves grossly intact. Skin: No rashes or petechiae noted. Psych: Normal affect.  LAB RESULTS:  Lab Results  Component Value Date   NA 134 (L) 04/29/2020   K 4.5 04/29/2020   CL 101 04/29/2020   CO2 26 04/29/2020   GLUCOSE 320 (H) 04/29/2020   BUN 19 04/29/2020   CREATININE 1.14 04/29/2020   CALCIUM 8.6 (L) 04/29/2020   PROT 6.1 (L) 04/29/2020   ALBUMIN 4.1 04/29/2020   AST 15 04/29/2020   ALT 19 04/29/2020   ALKPHOS 62 04/29/2020   BILITOT 1.1 04/29/2020   GFRNONAA >60 04/29/2020   GFRAA >60 04/29/2020    Lab Results  Component Value Date   WBC 6.8 04/29/2020   NEUTROABS 3.6 10/26/2019   HGB 15.6 04/29/2020   HCT 46.7 04/29/2020   MCV 83.1 04/29/2020   PLT 145 (L) 04/29/2020     STUDIES: No results found.  ASSESSMENT: CLL.  PLAN:    1. CLL: Patient's white blood cell count continues to be within normal limits at 6.8 today. 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 PET scan also revealed diffuse bilateral cervical, axillary, retroperitoneal, iliac, and inguinal lymphadenopathy with a low level of metabolic uptake consistent with CLL. 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 Patient initiated Dolores Lory in approximately November 2017 with significant improvement of his B symptoms including his unusual neuropathy symptoms.  Continue Imbruvica until intolerable side effects or definitive progression of disease.  No further imaging is necessary unless there is suspicion of progression of disease.  Return to clinic in 6 months with  repeat laboratory work and further evaluation. 2. 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  Continue follow-up with neurology for evaluation and treatment. 3. Difficulty swallowing: Patient does not complain of this today.  Appreciate GI input.  Patient had esophageal dilation on June 13, 2018. 4.  Hyperglycemia: Patient's blood glucose is significantly elevated today.  Continue follow-up and treatment per primary care. 5.  Thrombocytopenia: Chronic and unchanged.   Patient expressed understanding and was in agreement with this plan. He also understands that He can call clinic at any time with any questions, concerns, or complaints.    Lloyd Huger, MD   04/29/2020 12:32 PM

## 2020-04-29 ENCOUNTER — Inpatient Hospital Stay: Payer: BC Managed Care – PPO | Attending: Oncology

## 2020-04-29 ENCOUNTER — Other Ambulatory Visit: Payer: Self-pay

## 2020-04-29 ENCOUNTER — Encounter: Payer: Self-pay | Admitting: Oncology

## 2020-04-29 ENCOUNTER — Inpatient Hospital Stay: Payer: BC Managed Care – PPO | Admitting: Oncology

## 2020-04-29 VITALS — BP 122/81 | HR 63 | Temp 96.0°F | Resp 20 | Wt 193.7 lb

## 2020-04-29 DIAGNOSIS — R1310 Dysphagia, unspecified: Secondary | ICD-10-CM | POA: Diagnosis not present

## 2020-04-29 DIAGNOSIS — D696 Thrombocytopenia, unspecified: Secondary | ICD-10-CM | POA: Diagnosis not present

## 2020-04-29 DIAGNOSIS — Z7984 Long term (current) use of oral hypoglycemic drugs: Secondary | ICD-10-CM | POA: Diagnosis not present

## 2020-04-29 DIAGNOSIS — E1165 Type 2 diabetes mellitus with hyperglycemia: Secondary | ICD-10-CM | POA: Insufficient documentation

## 2020-04-29 DIAGNOSIS — Z8249 Family history of ischemic heart disease and other diseases of the circulatory system: Secondary | ICD-10-CM | POA: Diagnosis not present

## 2020-04-29 DIAGNOSIS — Z79899 Other long term (current) drug therapy: Secondary | ICD-10-CM | POA: Diagnosis not present

## 2020-04-29 DIAGNOSIS — C9110 Chronic lymphocytic leukemia of B-cell type not having achieved remission: Secondary | ICD-10-CM | POA: Insufficient documentation

## 2020-04-29 DIAGNOSIS — Z800 Family history of malignant neoplasm of digestive organs: Secondary | ICD-10-CM | POA: Diagnosis not present

## 2020-04-29 DIAGNOSIS — G629 Polyneuropathy, unspecified: Secondary | ICD-10-CM | POA: Insufficient documentation

## 2020-04-29 DIAGNOSIS — Z833 Family history of diabetes mellitus: Secondary | ICD-10-CM | POA: Insufficient documentation

## 2020-04-29 LAB — COMPREHENSIVE METABOLIC PANEL
ALT: 19 U/L (ref 0–44)
AST: 15 U/L (ref 15–41)
Albumin: 4.1 g/dL (ref 3.5–5.0)
Alkaline Phosphatase: 62 U/L (ref 38–126)
Anion gap: 7 (ref 5–15)
BUN: 19 mg/dL (ref 8–23)
CO2: 26 mmol/L (ref 22–32)
Calcium: 8.6 mg/dL — ABNORMAL LOW (ref 8.9–10.3)
Chloride: 101 mmol/L (ref 98–111)
Creatinine, Ser: 1.14 mg/dL (ref 0.61–1.24)
GFR calc Af Amer: >60 mL/min (ref >60)
GFR calc non Af Amer: >60 mL/min (ref >60)
Glucose, Bld: 320 mg/dL — ABNORMAL HIGH (ref 70–99)
Potassium: 4.5 mmol/L (ref 3.5–5.1)
Sodium: 134 mmol/L — ABNORMAL LOW (ref 135–145)
Total Bilirubin: 1.1 mg/dL (ref 0.3–1.2)
Total Protein: 6.1 g/dL — ABNORMAL LOW (ref 6.5–8.1)

## 2020-04-29 LAB — CBC
HCT: 46.7 % (ref 39.0–52.0)
Hemoglobin: 15.6 g/dL (ref 13.0–17.0)
MCH: 27.8 pg (ref 26.0–34.0)
MCHC: 33.4 g/dL (ref 30.0–36.0)
MCV: 83.1 fL (ref 80.0–100.0)
Platelets: 145 10*3/uL — ABNORMAL LOW (ref 150–400)
RBC: 5.62 MIL/uL (ref 4.22–5.81)
RDW: 12.4 % (ref 11.5–15.5)
WBC: 6.8 10*3/uL (ref 4.0–10.5)
nRBC: 0 % (ref 0.0–0.2)

## 2020-07-01 MED FILL — IMBRUVICA 420 MG TAB: 420 | 28 days supply | Qty: 28 | Fill #5

## 2020-07-04 ENCOUNTER — Telehealth: Payer: Self-pay | Admitting: *Deleted

## 2020-07-04 NOTE — Telephone Encounter (Signed)
Pt is scheduled for lab only tomorrow morning @ 8:45. Would like a call when it results.

## 2020-07-04 NOTE — Telephone Encounter (Signed)
Yes, he can have a lab only appointment.   Tomorrow would be fine if possible.

## 2020-07-04 NOTE — Telephone Encounter (Signed)
Patient called asking if you would order a cbc for him. States he is feeling tired and his next appointment is not until November. Please advise

## 2020-07-05 ENCOUNTER — Other Ambulatory Visit: Payer: Self-pay

## 2020-07-05 ENCOUNTER — Inpatient Hospital Stay: Payer: BC Managed Care – PPO | Attending: Oncology

## 2020-07-05 DIAGNOSIS — C9110 Chronic lymphocytic leukemia of B-cell type not having achieved remission: Secondary | ICD-10-CM | POA: Insufficient documentation

## 2020-07-05 LAB — CBC
HCT: 45.3 % (ref 39.0–52.0)
Hemoglobin: 15.5 g/dL (ref 13.0–17.0)
MCH: 28 pg (ref 26.0–34.0)
MCHC: 34.2 g/dL (ref 30.0–36.0)
MCV: 81.9 fL (ref 80.0–100.0)
Platelets: 148 10*3/uL — ABNORMAL LOW (ref 150–400)
RBC: 5.53 MIL/uL (ref 4.22–5.81)
RDW: 12.3 % (ref 11.5–15.5)
WBC: 6.8 10*3/uL (ref 4.0–10.5)
nRBC: 0 % (ref 0.0–0.2)

## 2020-07-05 LAB — COMPREHENSIVE METABOLIC PANEL
ALT: 18 U/L (ref 0–44)
AST: 17 U/L (ref 15–41)
Albumin: 3.8 g/dL (ref 3.5–5.0)
Alkaline Phosphatase: 61 U/L (ref 38–126)
Anion gap: 8 (ref 5–15)
BUN: 17 mg/dL (ref 8–23)
CO2: 26 mmol/L (ref 22–32)
Calcium: 8.6 mg/dL — ABNORMAL LOW (ref 8.9–10.3)
Chloride: 102 mmol/L (ref 98–111)
Creatinine, Ser: 1.1 mg/dL (ref 0.61–1.24)
GFR calc Af Amer: >60 mL/min (ref >60)
GFR calc non Af Amer: >60 mL/min (ref >60)
Glucose, Bld: 302 mg/dL — ABNORMAL HIGH (ref 70–99)
Potassium: 4.9 mmol/L (ref 3.5–5.1)
Sodium: 136 mmol/L (ref 135–145)
Total Bilirubin: 0.7 mg/dL (ref 0.3–1.2)
Total Protein: 6 g/dL — ABNORMAL LOW (ref 6.5–8.1)

## 2020-07-05 NOTE — Telephone Encounter (Signed)
Call to patient and advised that labs are stable from last draw 2 months ago. He thanked me for calling and said he must be tired for another reason

## 2020-07-29 ENCOUNTER — Other Ambulatory Visit: Payer: Self-pay | Admitting: Oncology

## 2020-07-29 DIAGNOSIS — C9110 Chronic lymphocytic leukemia of B-cell type not having achieved remission: Secondary | ICD-10-CM

## 2020-07-31 MED FILL — IMBRUVICA 420 MG TAB: 420 | 28 days supply | Qty: 28 | Fill #0

## 2020-08-28 MED FILL — IMBRUVICA 420 MG TAB: 420 | 28 days supply | Qty: 28 | Fill #1

## 2020-09-25 MED FILL — IMBRUVICA 420 MG TAB: 420 | 28 days supply | Qty: 28 | Fill #2

## 2020-09-30 ENCOUNTER — Telehealth: Payer: Self-pay | Admitting: Pharmacy Technician

## 2020-09-30 NOTE — Telephone Encounter (Signed)
Oral Oncology Patient Advocate Encounter  Received fax request from [place] that prior authorization for Imbruvica was going to expire soon and to submit questionnaire.  Questions answered and faxed back to [facility].  Received fax from [place] that [place] prior authorization has been approved.  PA number: [id number] Approval dates: 09/30/20-09/30/21  [place] Patient [place] Phone [phone] Fax [phone] 09/30/2020 3:28 PM

## 2020-10-22 ENCOUNTER — Other Ambulatory Visit: Payer: Self-pay | Admitting: Oncology

## 2020-10-23 MED FILL — IMBRUVICA 420 MG TAB: 420 | 28 days supply | Qty: 28 | Fill #3

## 2020-10-26 NOTE — Progress Notes (Signed)
[place]  Telephone:([phone] [phone]  ID: Travis [name]. OB: 01-06-1951  MR#: [id number]  LTR#:[id number]  Patient Care Team: Adin Hector, MD as PCP - General (Internal Medicine) Lucilla Lame, MD as Consulting Physician (Gastroenterology) Anabel Bene, MD as Referring Physician (Neurology)  CHIEF COMPLAINT: CLL.  INTERVAL HISTORY: Patient returns to clinic today for routine 30-monthevaluation.  He continues to feel well and remains at his baseline.  He is tolerating Imbruvica well without significant side effects.  He continues to have chronic neurologic problems that are slightly improved.  He denies any recent fevers or illnesses.  He has a good appetite and denies weight loss.  He denies any chest pain, shortness of breath, cough, or hemoptysis.  He denies any nausea, vomiting, constipation, or diarrhea. He has no urinary complaints.  Patient offers no further specific complaints today.  REVIEW OF SYSTEMS:   Review of Systems  Constitutional: Negative for diaphoresis, fever, malaise/fatigue and weight loss.  Respiratory: Negative.  Negative for cough and shortness of breath.   Cardiovascular: Negative.  Negative for chest pain and leg swelling.  Gastrointestinal: Negative for abdominal pain, blood in stool, melena, nausea and vomiting.  Genitourinary: Negative.  Negative for dysuria.  Musculoskeletal: Negative.  Negative for back pain.  Skin: Negative.  Negative for rash.  Neurological: Positive for focal weakness. Negative for tingling, sensory change and weakness.  Psychiatric/Behavioral: Negative.  Negative for depression. The patient is not nervous/anxious and does not have insomnia.     As per HPI. Otherwise, a complete review of systems is negative.  PAST MEDICAL HISTORY: Past Medical History:  Diagnosis Date  . [place]   . [place]     PAST SURGICAL HISTORY: Past Surgical  History:  Procedure Laterality Date  . COLONOSCOPY    . ESOPHAGOGASTRODUODENOSCOPY (EGD) WITH PROPOFOL N/A 03/14/2018   Procedure: ESOPHAGOGASTRODUODENOSCOPY (EGD) WITH PROPOFOL;  Surgeon: AJonathon Bellows MD;  [facility];  Service: Gastroenterology;  Laterality: N/A;  . ESOPHAGOGASTRODUODENOSCOPY (EGD) WITH PROPOFOL N/A 06/13/2018   Procedure: ESOPHAGOGASTRODUODENOSCOPY (EGD) WITH PROPOFOL;  Surgeon: AJonathon Bellows MD;  [facility];  Service: Gastroenterology;  Laterality: N/A;  . WISDOM TOOTH EXTRACTION      FAMILY HISTORY: Family History  Problem Relation Age of Onset  . Leukemia Mother   . Colon cancer Father   . Diabetes Father   . Heart disease Father   . Diabetes Brother     ADVANCED DIRECTIVES (Y/N):  N  HEALTH MAINTENANCE: Social History   Tobacco Use  . Smoking status: Never Smoker  . Smokeless tobacco: Never Used  Vaping Use  . Vaping Use: Never used  Substance Use Topics  . Alcohol use: Yes    Comment: rarely  . Drug use: No     Colonoscopy:  PAP:  Bone density:  Lipid panel:  No Known Allergies  Current Outpatient Medications  Medication Sig Dispense Refill  . acetaminophen (TYLENOL) 500 MG tablet Take 2,000 mg by mouth as needed.     . Cholecalciferol (VITAMIN D3) 1000 units CAPS Take by mouth.    . IMBRUVICA 420 MG TABS TAKE 1 TABLET BY MOUTH DAILY. 28 tablet 5  . metFORMIN (GLUCOPHAGE) 1000 MG tablet Take by mouth.    . ondansetron (ZOFRAN-ODT) 4 MG disintegrating tablet Take 1 tablet by mouth as needed.      No current facility-administered medications for this visit.    OBJECTIVE: Vitals:   10/31/20 1006  BP: (!) 147/87  Pulse: 71  Resp: 20  Temp: 98.4 F (36.9 C)     Body mass index is 27.41 kg/m.    ECOG FS:1 - Symptomatic but completely ambulatory  General: Well-developed, well-nourished, no acute distress. Eyes: Pink conjunctiva, anicteric sclera. HEENT: Normocephalic, moist mucous membranes. Lungs: No  audible wheezing or coughing. Heart: Regular rate and rhythm. Abdomen: Soft, nontender, no obvious distention. Musculoskeletal: No edema, cyanosis, or clubbing. Neuro: Alert, answering all questions appropriately. Cranial nerves grossly intact. Skin: No rashes or petechiae noted. Psych: Normal affect.  LAB RESULTS:  Lab Results  Component Value Date   NA 133 (L) 10/31/2020   K 4.5 10/31/2020   CL 99 10/31/2020   CO2 24 10/31/2020   GLUCOSE 343 (H) 10/31/2020   BUN 20 10/31/2020   CREATININE 1.13 10/31/2020   CALCIUM 9.0 10/31/2020   PROT 6.6 10/31/2020   ALBUMIN 4.2 10/31/2020   AST 18 10/31/2020   ALT 21 10/31/2020   ALKPHOS 66 10/31/2020   BILITOT 1.1 10/31/2020   GFRNONAA >60 10/31/2020   GFRAA >60 07/05/2020    Lab Results  Component Value Date   WBC 8.3 10/31/2020   NEUTROABS 3.6 10/26/2019   HGB 16.5 10/31/2020   HCT 48.3 10/31/2020   MCV 81.9 10/31/2020   PLT 182 10/31/2020     STUDIES: No results found.  ASSESSMENT: CLL.  PLAN:    1. CLL: No evidence of disease.  Patient's white blood cell count continues to be within normal limits.  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 PET scan also revealed diffuse bilateral cervical, axillary, retroperitoneal, iliac, and inguinal lymphadenopathy with a low level of metabolic uptake consistent with CLL. 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 Patient initiated Dolores Lory in approximately November 2017 with significant improvement of his B symptoms including his unusual neuropathy symptoms.  We will continue Imbruvica for at least 5 years through November/December 2022.  At that point can consider discontinuing treatment.  No further imaging is necessary unless there is suspicion of progression of disease.  Return to clinic in 3 months for laboratory work only and then in 6 months for laboratory work and further evaluation. 2.  Neuropathy, foot drop: Chronic and unchanged.  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  Patient was given a referral to Occupational Therapy to help improve balance. 3. Difficulty swallowing: Patient does not complain of this today.  Appreciate GI input.  Patient had esophageal dilation on June 13, 2018. 4.  Hyperglycemia: Patient continues to have poor blood glucose control.  Continue follow-up and treatment per primary care. 5.  Thrombocytopenia: Resolved.   Patient expressed understanding and was in agreement with this plan. He also understands that He can call clinic at any time with any questions, concerns, or complaints.    Lloyd Huger, MD   11/01/2020 6:40 AM

## 2020-10-30 ENCOUNTER — Encounter: Payer: Self-pay | Admitting: Oncology

## 2020-10-30 ENCOUNTER — Ambulatory Visit: Payer: BC Managed Care – PPO | Admitting: Oncology

## 2020-10-30 ENCOUNTER — Other Ambulatory Visit: Payer: BC Managed Care – PPO

## 2020-10-30 NOTE — Progress Notes (Signed)
Patient called for pre assessment. She denies any pain or concerns at this time.  

## 2020-10-31 ENCOUNTER — Inpatient Hospital Stay: Payer: BC Managed Care – PPO

## 2020-10-31 ENCOUNTER — Other Ambulatory Visit: Payer: Self-pay

## 2020-10-31 ENCOUNTER — Inpatient Hospital Stay: Payer: BC Managed Care – PPO | Attending: Oncology | Admitting: Oncology

## 2020-10-31 VITALS — BP 147/87 | HR 71 | Temp 98.4°F | Resp 20 | Wt 196.5 lb

## 2020-10-31 DIAGNOSIS — R1310 Dysphagia, unspecified: Secondary | ICD-10-CM | POA: Diagnosis not present

## 2020-10-31 DIAGNOSIS — M21379 Foot drop, unspecified foot: Secondary | ICD-10-CM | POA: Diagnosis not present

## 2020-10-31 DIAGNOSIS — Z7984 Long term (current) use of oral hypoglycemic drugs: Secondary | ICD-10-CM | POA: Diagnosis not present

## 2020-10-31 DIAGNOSIS — Z806 Family history of leukemia: Secondary | ICD-10-CM | POA: Diagnosis not present

## 2020-10-31 DIAGNOSIS — Z833 Family history of diabetes mellitus: Secondary | ICD-10-CM | POA: Insufficient documentation

## 2020-10-31 DIAGNOSIS — Z800 Family history of malignant neoplasm of digestive organs: Secondary | ICD-10-CM | POA: Insufficient documentation

## 2020-10-31 DIAGNOSIS — Z79899 Other long term (current) drug therapy: Secondary | ICD-10-CM | POA: Insufficient documentation

## 2020-10-31 DIAGNOSIS — E1165 Type 2 diabetes mellitus with hyperglycemia: Secondary | ICD-10-CM | POA: Diagnosis not present

## 2020-10-31 DIAGNOSIS — C9110 Chronic lymphocytic leukemia of B-cell type not having achieved remission: Secondary | ICD-10-CM

## 2020-10-31 DIAGNOSIS — Z8249 Family history of ischemic heart disease and other diseases of the circulatory system: Secondary | ICD-10-CM | POA: Insufficient documentation

## 2020-10-31 DIAGNOSIS — G629 Polyneuropathy, unspecified: Secondary | ICD-10-CM | POA: Diagnosis not present

## 2020-10-31 LAB — COMPREHENSIVE METABOLIC PANEL
ALT: 21 U/L (ref 0–44)
AST: 18 U/L (ref 15–41)
Albumin: 4.2 g/dL (ref 3.5–5.0)
Alkaline Phosphatase: 66 U/L (ref 38–126)
Anion gap: 10 (ref 5–15)
BUN: 20 mg/dL (ref 8–23)
CO2: 24 mmol/L (ref 22–32)
Calcium: 9 mg/dL (ref 8.9–10.3)
Chloride: 99 mmol/L (ref 98–111)
Creatinine, Ser: 1.13 mg/dL (ref 0.61–1.24)
GFR, Estimated: >60 mL/min (ref >60)
Glucose, Bld: 343 mg/dL — ABNORMAL HIGH (ref 70–99)
Potassium: 4.5 mmol/L (ref 3.5–5.1)
Sodium: 133 mmol/L — ABNORMAL LOW (ref 135–145)
Total Bilirubin: 1.1 mg/dL (ref 0.3–1.2)
Total Protein: 6.6 g/dL (ref 6.5–8.1)

## 2020-10-31 LAB — CBC
HCT: 48.3 % (ref 39.0–52.0)
Hemoglobin: 16.5 g/dL (ref 13.0–17.0)
MCH: 28 pg (ref 26.0–34.0)
MCHC: 34.2 g/dL (ref 30.0–36.0)
MCV: 81.9 fL (ref 80.0–100.0)
Platelets: 182 10*3/uL (ref 150–400)
RBC: 5.9 MIL/uL — ABNORMAL HIGH (ref 4.22–5.81)
RDW: 12.6 % (ref 11.5–15.5)
WBC: 8.3 10*3/uL (ref 4.0–10.5)
nRBC: 0 % (ref 0.0–0.2)

## 2020-11-06 ENCOUNTER — Inpatient Hospital Stay: Payer: BC Managed Care – PPO | Admitting: Occupational Therapy

## 2020-11-22 MED FILL — IMBRUVICA 420 MG TAB: 420 | 28 days supply | Qty: 28 | Fill #4

## 2020-12-19 MED FILL — IMBRUVICA 420 MG TAB: 420 | 28 days supply | Qty: 28 | Fill #5

## 2021-01-14 ENCOUNTER — Other Ambulatory Visit: Payer: Self-pay | Admitting: Oncology

## 2021-01-14 DIAGNOSIS — C9110 Chronic lymphocytic leukemia of B-cell type not having achieved remission: Secondary | ICD-10-CM

## 2021-01-20 MED FILL — IMBRUVICA 420 MG TAB: 420 | 28 days supply | Qty: 28 | Fill #0

## 2021-02-03 ENCOUNTER — Inpatient Hospital Stay: Payer: BC Managed Care – PPO | Attending: Oncology

## 2021-02-03 DIAGNOSIS — C9110 Chronic lymphocytic leukemia of B-cell type not having achieved remission: Secondary | ICD-10-CM | POA: Diagnosis not present

## 2021-02-03 LAB — COMPREHENSIVE METABOLIC PANEL
ALT: 21 U/L (ref 0–44)
AST: 18 U/L (ref 15–41)
Albumin: 3.9 g/dL (ref 3.5–5.0)
Alkaline Phosphatase: 64 U/L (ref 38–126)
Anion gap: 8 (ref 5–15)
BUN: 16 mg/dL (ref 8–23)
CO2: 26 mmol/L (ref 22–32)
Calcium: 9 mg/dL (ref 8.9–10.3)
Chloride: 100 mmol/L (ref 98–111)
Creatinine, Ser: 1.18 mg/dL (ref 0.61–1.24)
GFR, Estimated: >60 mL/min (ref >60)
Glucose, Bld: 358 mg/dL — ABNORMAL HIGH (ref 70–99)
Potassium: 4.6 mmol/L (ref 3.5–5.1)
Sodium: 134 mmol/L — ABNORMAL LOW (ref 135–145)
Total Bilirubin: 0.7 mg/dL (ref 0.3–1.2)
Total Protein: 6.4 g/dL — ABNORMAL LOW (ref 6.5–8.1)

## 2021-02-03 LAB — CBC
HCT: 44.9 % (ref 39.0–52.0)
Hemoglobin: 15.5 g/dL (ref 13.0–17.0)
MCH: 28.4 pg (ref 26.0–34.0)
MCHC: 34.5 g/dL (ref 30.0–36.0)
MCV: 82.2 fL (ref 80.0–100.0)
Platelets: 154 10*3/uL (ref 150–400)
RBC: 5.46 MIL/uL (ref 4.22–5.81)
RDW: 12.9 % (ref 11.5–15.5)
WBC: 6.6 10*3/uL (ref 4.0–10.5)
nRBC: 0 % (ref 0.0–0.2)

## 2021-02-13 MED FILL — IMBRUVICA 420 MG TAB: 420 | 28 days supply | Qty: 28 | Fill #1

## 2021-03-17 MED FILL — IMBRUVICA 420 MG TAB: 420 | 28 days supply | Qty: 28 | Fill #2

## 2021-03-27 ENCOUNTER — Other Ambulatory Visit (HOSPITAL_COMMUNITY): Payer: Self-pay

## 2021-04-09 ENCOUNTER — Other Ambulatory Visit (HOSPITAL_COMMUNITY): Payer: Self-pay

## 2021-04-09 MED FILL — Ibrutinib Tab 420 MG: ORAL | 28 days supply | Qty: 28 | Fill #0 | Status: AC

## 2021-04-14 ENCOUNTER — Telehealth: Payer: Self-pay | Admitting: *Deleted

## 2021-04-14 ENCOUNTER — Other Ambulatory Visit: Payer: Self-pay | Admitting: Oncology

## 2021-04-14 DIAGNOSIS — U071 COVID-19: Secondary | ICD-10-CM

## 2021-04-14 NOTE — Telephone Encounter (Signed)
[facility] called reporting [facility] and has tested positive for COVID but is unable [facility] because he is on Imbruvica. He is asking what he is to do. Please advise

## 2021-04-14 NOTE — Telephone Encounter (Signed)
The Anti viral to treat COVID, (I guess the mAb that is being used)

## 2021-04-14 NOTE — Telephone Encounter (Signed)
Stay on imbruvica and get mab as well.

## 2021-04-14 NOTE — Telephone Encounter (Signed)
Travis [name], can you call this patient regarding mAb treatment

## 2021-04-14 NOTE — Telephone Encounter (Signed)
Sure! I will call them.   Faythe Casa, NP 04/14/2021 12:02 PM

## 2021-04-15 ENCOUNTER — Ambulatory Visit (INDEPENDENT_AMBULATORY_CARE_PROVIDER_SITE_OTHER): Payer: BC Managed Care – PPO

## 2021-04-15 ENCOUNTER — Other Ambulatory Visit (HOSPITAL_COMMUNITY): Payer: Self-pay

## 2021-04-15 ENCOUNTER — Other Ambulatory Visit: Payer: Self-pay

## 2021-04-15 ENCOUNTER — Telehealth: Payer: Self-pay

## 2021-04-15 ENCOUNTER — Other Ambulatory Visit: Payer: Self-pay | Admitting: Oncology

## 2021-04-15 DIAGNOSIS — U071 COVID-19: Secondary | ICD-10-CM | POA: Diagnosis not present

## 2021-04-15 MED ORDER — DIPHENHYDRAMINE HCL 50 MG/ML IJ SOLN
50.0000 mg | Freq: Once | INTRAMUSCULAR | Status: AC | PRN
Start: 2021-04-15 — End: 2021-04-15

## 2021-04-15 MED ORDER — ALBUTEROL SULFATE HFA 108 (90 BASE) MCG/ACT IN AERS: 2.0000 | INHALATION_SPRAY | Freq: Once | RESPIRATORY_TRACT | Status: AC | PRN

## 2021-04-15 MED ORDER — EPINEPHRINE 0.3 MG/0.3ML IJ SOAJ: 0.3000 mg | Freq: Once | INTRAMUSCULAR | Status: AC | PRN

## 2021-04-15 MED ORDER — METHYLPREDNISOLONE SODIUM SUCC 125 MG IJ SOLR: 125.0000 mg | Freq: Once | INTRAMUSCULAR | Status: AC | PRN

## 2021-04-15 MED ORDER — SODIUM CHLORIDE 0.9 % IV SOLN: INTRAVENOUS | Status: DC | PRN

## 2021-04-15 MED ORDER — FAMOTIDINE IN NACL 20-0.9 MG/50ML-% IV SOLN: 20.0000 mg | Freq: Once | INTRAVENOUS | Status: AC | PRN

## 2021-04-15 MED ORDER — BEBTELOVIMAB 175 MG/2 ML IV (EUA)
175.0000 mg | Freq: Once | INTRAMUSCULAR | Status: AC
  Administered 2021-04-15: 175 mg via INTRAVENOUS

## 2021-04-15 NOTE — Progress Notes (Signed)
I connected by phone with Travis [name]. on 04/15/2021 at 1:19 PM to discuss the potential use of a new treatment for mild to moderate COVID-19 viral infection in non-hospitalized patients.  This patient is a 70 y.o. male that meets the FDA criteria for Emergency Use Authorization of COVID monoclonal antibody bebtelovimab.  Has a (+) direct SARS-CoV-2 viral test result  Has mild or moderate COVID-19   Is NOT hospitalized due to COVID-19  Is within 10 days of symptom onset  Has at least one of the high risk factor(s) for progression to severe COVID-19 and/or hospitalization as defined in EUA.  Specific high risk criteria : Immunosuppressive Disease or Treatment   I have spoken and communicated the following to the patient or parent/caregiver regarding COVID monoclonal antibody treatment:  1. FDA has authorized the emergency use for the treatment of mild to moderate COVID-19 in adults and pediatric patients with positive results of direct SARS-CoV-2 viral testing who are 49 years of age and older weighing at least 40 kg, and who are at high risk for progressing to severe COVID-19 and/or hospitalization.  2. The significant known and potential risks and benefits of COVID monoclonal antibody, and the extent to which such potential risks and benefits are unknown.  3. Information on available alternative treatments and the risks and benefits of those alternatives, including clinical trials.  4. Patients treated with COVID monoclonal antibody should continue to self-isolate and use infection control measures (e.g., wear mask, isolate, social distance, avoid sharing personal items, clean and disinfect "high touch" surfaces, and frequent handwashing) according to CDC guidelines.   5. The patient or parent/caregiver has the option to accept or refuse COVID monoclonal antibody treatment.  6. Discussion about the monoclonal antibody infusion does not ensure treatment. The patient will be placed  on a list and scheduled according to risk, symptom onset and availability. A scheduler will reach to the patient to let them know if we can accommodate their infusion or not.  After reviewing this information with the patient, the patient has agreed to receive one of the available covid 19 monoclonal antibodies and will be provided an appropriate fact sheet prior to infusion. [name], NP 04/15/2021 1:19 PM

## 2021-04-15 NOTE — Telephone Encounter (Signed)
Called to discuss with patient about COVID-19 symptoms and the use of one of the available treatments for those with mild to moderate Covid symptoms and at a high risk of hospitalization.  Pt appears to qualify for outpatient treatment due to co-morbid conditions and/or a member of an at-risk group in accordance with the FDA Emergency Use Authorization.    Symptom onset: Unknown Vaccinated: Yes Booster? Unknown Immunocompromised? Yes Qualifiers: CLL, DM  Unable to reach pt - Left message and call back number [phone].  Marcello Moores

## 2021-04-15 NOTE — Progress Notes (Signed)
Diagnosis: Covid  Provider:  Marshell Garfinkel, MD  Procedure: Infusion  IV Type: Peripheral, IV Location: R Hand  Bebtelovimab, Dose: 175mg   Infusion Start Time: 1610  Infusion Stop Time: 1710  Post Infusion IV Care: Observation period completed and Peripheral IV Discontinued  Discharge: Condition: Good, Destination: Home . AVS provided to patient.   Performed by:  Arnoldo Morale, RN

## 2021-04-15 NOTE — Patient Instructions (Signed)
10 Things You Can Do to Manage Your COVID-19 Symptoms at Home If you have possible or confirmed COVID-19: 1. Stay home except to get medical care. 2. Monitor your symptoms carefully. If your symptoms get worse, call your healthcare provider immediately. 3. Get rest and stay hydrated. 4. If you have a medical appointment, call the healthcare provider ahead of time and tell them that you have or may have COVID-19. 5. For medical emergencies, call 911 and notify the dispatch personnel that you have or may have COVID-19. 6. Cover your cough and sneezes with a tissue or use the inside of your elbow. 7. Wash your hands often with soap and water for at least 20 seconds or clean your hands with an alcohol-based hand sanitizer that contains at least 60% alcohol. 8. As much as possible, stay in a specific room and away from other people in your home. Also, you should use a separate bathroom, if available. If you need to be around other people in or outside of the home, wear a mask. 9. Avoid sharing personal items with other people in your household, like dishes, towels, and bedding. 10. Clean all surfaces that are touched often, like counters, tabletops, and doorknobs. Use household cleaning sprays or wipes according to the label instructions. cdc.gov/coronavirus 07/12/2020 This information is not intended to replace advice given to you by your health care provider. Make sure you discuss any questions you have with your health care provider. Document Revised: 10/28/2020 Document Reviewed: 10/28/2020 Elsevier Patient Education  2021 Elsevier Inc.  What types of side effects do monoclonal antibody drugs cause?  Common side effects  In general, the more common side effects caused by monoclonal antibody drugs include: . Allergic reactions, such as hives or itching . Flu-like signs and symptoms, including chills, fatigue, fever, and muscle aches and pains . Nausea, vomiting . Diarrhea . Skin  rashes . Low blood pressure   The CDC is recommending patients who receive monoclonal antibody treatments wait at least 90 days before being vaccinated.  Currently, there are no data on the safety and efficacy of mRNA COVID-19 vaccines in persons who received monoclonal antibodies or convalescent plasma as part of COVID-19 treatment. Based on the estimated half-life of such therapies as well as evidence suggesting that reinfection is uncommon in the 90 days after initial infection, vaccination should be deferred for at least 90 days, as a precautionary measure until additional information becomes available, to avoid interference of the antibody treatment with vaccine-induced immune responses.   If someone you know is interested in receiving treatment please have them contact their MD for a referral or visit [email or web address]    

## 2021-04-26 NOTE — Progress Notes (Signed)
[facility]  [phone] [phone]  ID: Travis [name]. OB: Feb 18, 1951  MR#: [id number]  TOI#:[id number]  Patient Care Team: Adin Hector, MD as PCP - General (Internal Medicine) Lucilla Lame, MD as Consulting Physician (Gastroenterology) Anabel Bene, MD as Referring Physician (Neurology)  CHIEF COMPLAINT: CLL.  INTERVAL HISTORY: Patient returns to clinic today for repeat laboratory work and routine 90-monthevaluation.  He continues to feel well and remains at his baseline.  He is tolerating Imbruvica without significant side effects.  He has chronic neurologic problems that are unchanged.  He denies any recent fevers or illnesses.  He has a good appetite and denies weight loss.  He denies any chest pain, shortness of breath, cough, or hemoptysis.  He denies any nausea, vomiting, constipation, or diarrhea. He has no urinary complaints.  Patient offers no further specific complaints today.  REVIEW OF SYSTEMS:   Review of Systems  Constitutional: Negative for diaphoresis, fever, malaise/fatigue and weight loss.  Respiratory: Negative.  Negative for cough and shortness of breath.   Cardiovascular: Negative.  Negative for chest pain and leg swelling.  Gastrointestinal: Negative for abdominal pain, blood in stool, melena, nausea and vomiting.  Genitourinary: Negative.  Negative for dysuria.  Musculoskeletal: Negative.  Negative for back pain.  Skin: Negative.  Negative for rash.  Neurological: Positive for focal weakness. Negative for tingling, sensory change and weakness.  Psychiatric/Behavioral: Negative.  Negative for depression. The patient is not nervous/anxious and does not have insomnia.     As per HPI. Otherwise, a complete review of systems is negative.  PAST MEDICAL HISTORY: Past Medical History:  Diagnosis Date  . Diabetes mellitus without complication ([place]   . Leukemia ([place]     PAST SURGICAL HISTORY: Past Surgical  History:  Procedure Laterality Date  . COLONOSCOPY    . ESOPHAGOGASTRODUODENOSCOPY (EGD) WITH PROPOFOL N/A 03/14/2018   Procedure: ESOPHAGOGASTRODUODENOSCOPY (EGD) WITH PROPOFOL;  Surgeon: AJonathon Bellows MD;  [facility];  Service: Gastroenterology;  Laterality: N/A;  . ESOPHAGOGASTRODUODENOSCOPY (EGD) WITH PROPOFOL N/A 06/13/2018   Procedure: ESOPHAGOGASTRODUODENOSCOPY (EGD) WITH PROPOFOL;  Surgeon: AJonathon Bellows MD;  [facility];  Service: Gastroenterology;  Laterality: N/A;  . WISDOM TOOTH EXTRACTION      FAMILY HISTORY: Family History  Problem Relation Age of Onset  . Leukemia Mother   . Colon cancer Father   . Diabetes Father   . Heart disease Father   . Diabetes Brother     ADVANCED DIRECTIVES (Y/N):  N  HEALTH MAINTENANCE: Social History   Tobacco Use  . Smoking status: Never Smoker  . Smokeless tobacco: Never Used  Vaping Use  . Vaping Use: Never used  Substance Use Topics  . Alcohol use: Yes    Comment: rarely  . Drug use: No     Colonoscopy:  PAP:  Bone density:  Lipid panel:  No Known Allergies  Current Outpatient Medications  Medication Sig Dispense Refill  . acetaminophen (TYLENOL) 500 MG tablet Take 2,000 mg by mouth as needed.     . benzonatate (TESSALON) 100 MG capsule Take 100 mg by mouth 3 (three) times daily as needed.    . Cholecalciferol (VITAMIN D3) 1000 units CAPS Take by mouth.    . citalopram (CELEXA) 20 MG tablet     . citalopram (CELEXA) 20 MG tablet Take by mouth.    . ibrutinib 420 MG TABS TAKE 1 TABLET BY MOUTH DAILY. 28 tablet 5  . metFORMIN (GLUCOPHAGE) 1000 MG  tablet Take by mouth.    . ondansetron (ZOFRAN-ODT) 4 MG disintegrating tablet Take 1 tablet by mouth as needed.      Current Facility-Administered Medications  Medication Dose Route Frequency Provider Last Rate Last Admin  . 0.9 %  sodium chloride infusion   Intravenous PRN [name], NP        OBJECTIVE: Vitals:   05/01/21 0955  BP:  129/89  Pulse: 66  Resp: 17  Temp: 97.9 F (36.6 C)  SpO2: 99%     Body mass index is 26.95 kg/m.    ECOG FS:1 - Symptomatic but completely ambulatory  General: Well-developed, well-nourished, no acute distress. Eyes: Pink conjunctiva, anicteric sclera. HEENT: Normocephalic, moist mucous membranes. Lungs: No audible wheezing or coughing. Heart: Regular rate and rhythm. Abdomen: Soft, nontender, no obvious distention. Musculoskeletal: No edema, cyanosis, or clubbing. Neuro: Alert, answering all questions appropriately. Cranial nerves grossly intact. Skin: No rashes or petechiae noted. Psych: Normal affect.   LAB RESULTS:  Lab Results  Component Value Date   NA 134 (L) 02/03/2021   K 4.6 02/03/2021   CL 100 02/03/2021   CO2 26 02/03/2021   GLUCOSE 358 (H) 02/03/2021   BUN 16 02/03/2021   CREATININE 1.18 02/03/2021   CALCIUM 9.0 02/03/2021   PROT 6.4 (L) 02/03/2021   ALBUMIN 3.9 02/03/2021   AST 18 02/03/2021   ALT 21 02/03/2021   ALKPHOS 64 02/03/2021   BILITOT 0.7 02/03/2021   GFRNONAA >60 02/03/2021   GFRAA >60 07/05/2020    Lab Results  Component Value Date   WBC 6.9 05/01/2021   NEUTROABS 3.6 10/26/2019   HGB 15.4 05/01/2021   HCT 45.3 05/01/2021   MCV 81.9 05/01/2021   PLT 163 05/01/2021     STUDIES: No results found.  ASSESSMENT: CLL.  PLAN:    1. CLL: No evidence of disease.  Patient's white blood cell count continues to be within normal limits.  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 PET scan also revealed diffuse bilateral cervical, axillary, retroperitoneal, iliac, and inguinal lymphadenopathy with a low level of metabolic uptake consistent with CLL. 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 Patient initiated Dolores Lory in approximately November 2017 with significant improvement of his B symptoms including his unusual neuropathy symptoms.  Continue Imbruvica  for at least 5 years through November/December 2022.  At that point can consider discontinuing treatment.  No further imaging is necessary unless there is suspicion of progression of disease.  Return to clinic in 3 months for repeat laboratory work only and then in 6 months for laboratory work, further evaluation, and discussion on whether or not to continue treatment.  2. Neuropathy, foot drop: Chronic and unchanged.  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  Patient was previously given a referral to Occupational Therapy to help improve balance. 3. Difficulty swallowing: Patient does not complain of this today.  Appreciate GI input.  Patient had esophageal dilation on June 13, 2018. 4.  Hyperglycemia: Historically, patient's blood glucose is in 300 range.  Continue follow-up and treatment per primary care  5.  Thrombocytopenia: Patient's platelet count has been within the normal range since November 2021.  I spent a total of 30 minutes reviewing chart data, face-to-face evaluation with the patient, counseling and coordination of care as detailed above.    Patient expressed understanding and was in agreement with this plan. He also  understands that He can call clinic at any time with any questions, concerns, or complaints.    Lloyd Huger, MD   05/01/2021 11:05 AM

## 2021-05-01 ENCOUNTER — Encounter: Payer: Self-pay | Admitting: Oncology

## 2021-05-01 ENCOUNTER — Other Ambulatory Visit: Payer: Self-pay

## 2021-05-01 ENCOUNTER — Inpatient Hospital Stay (HOSPITAL_BASED_OUTPATIENT_CLINIC_OR_DEPARTMENT_OTHER): Payer: BC Managed Care – PPO | Admitting: Oncology

## 2021-05-01 ENCOUNTER — Inpatient Hospital Stay: Payer: BC Managed Care – PPO | Attending: Oncology

## 2021-05-01 VITALS — BP 129/89 | HR 66 | Temp 97.9°F | Resp 17 | Ht 71.0 in | Wt 193.2 lb

## 2021-05-01 DIAGNOSIS — Z8249 Family history of ischemic heart disease and other diseases of the circulatory system: Secondary | ICD-10-CM | POA: Insufficient documentation

## 2021-05-01 DIAGNOSIS — Z833 Family history of diabetes mellitus: Secondary | ICD-10-CM | POA: Diagnosis not present

## 2021-05-01 DIAGNOSIS — Z79899 Other long term (current) drug therapy: Secondary | ICD-10-CM | POA: Insufficient documentation

## 2021-05-01 DIAGNOSIS — Z7984 Long term (current) use of oral hypoglycemic drugs: Secondary | ICD-10-CM | POA: Insufficient documentation

## 2021-05-01 DIAGNOSIS — E119 Type 2 diabetes mellitus without complications: Secondary | ICD-10-CM | POA: Insufficient documentation

## 2021-05-01 DIAGNOSIS — D696 Thrombocytopenia, unspecified: Secondary | ICD-10-CM | POA: Insufficient documentation

## 2021-05-01 DIAGNOSIS — C9110 Chronic lymphocytic leukemia of B-cell type not having achieved remission: Secondary | ICD-10-CM

## 2021-05-01 DIAGNOSIS — Z800 Family history of malignant neoplasm of digestive organs: Secondary | ICD-10-CM | POA: Insufficient documentation

## 2021-05-01 LAB — CBC
HCT: 45.3 % (ref 39.0–52.0)
Hemoglobin: 15.4 g/dL (ref 13.0–17.0)
MCH: 27.8 pg (ref 26.0–34.0)
MCHC: 34 g/dL (ref 30.0–36.0)
MCV: 81.9 fL (ref 80.0–100.0)
Platelets: 163 10*3/uL (ref 150–400)
RBC: 5.53 MIL/uL (ref 4.22–5.81)
RDW: 12.4 % (ref 11.5–15.5)
WBC: 6.9 10*3/uL (ref 4.0–10.5)
nRBC: 0 % (ref 0.0–0.2)

## 2021-05-09 ENCOUNTER — Other Ambulatory Visit (HOSPITAL_COMMUNITY): Payer: Self-pay

## 2021-05-09 MED FILL — Ibrutinib Tab 420 MG: ORAL | 28 days supply | Qty: 28 | Fill #1 | Status: AC

## 2021-05-12 ENCOUNTER — Other Ambulatory Visit (HOSPITAL_COMMUNITY): Payer: Self-pay

## 2021-05-14 ENCOUNTER — Other Ambulatory Visit (HOSPITAL_COMMUNITY): Payer: Self-pay

## 2021-06-09 ENCOUNTER — Other Ambulatory Visit (HOSPITAL_COMMUNITY): Payer: Self-pay

## 2021-06-11 ENCOUNTER — Other Ambulatory Visit (HOSPITAL_COMMUNITY): Payer: Self-pay

## 2021-06-11 MED FILL — Ibrutinib Tab 420 MG: ORAL | 28 days supply | Qty: 28 | Fill #2 | Status: AC

## 2021-07-09 ENCOUNTER — Other Ambulatory Visit: Payer: Self-pay | Admitting: Oncology

## 2021-07-09 ENCOUNTER — Other Ambulatory Visit (HOSPITAL_COMMUNITY): Payer: Self-pay

## 2021-07-09 DIAGNOSIS — C9110 Chronic lymphocytic leukemia of B-cell type not having achieved remission: Secondary | ICD-10-CM

## 2021-07-10 ENCOUNTER — Other Ambulatory Visit (HOSPITAL_COMMUNITY): Payer: Self-pay

## 2021-07-10 ENCOUNTER — Other Ambulatory Visit: Payer: Self-pay | Admitting: Oncology

## 2021-07-10 DIAGNOSIS — C9110 Chronic lymphocytic leukemia of B-cell type not having achieved remission: Secondary | ICD-10-CM

## 2021-07-10 MED ORDER — IMBRUVICA 420 MG PO TABS
1.0000 | ORAL_TABLET | Freq: Every day | ORAL | 5 refills | Status: DC
Start: 2021-07-10 — End: 2022-01-12
  Filled 2021-07-10: qty 28, 28d supply, fill #0
  Filled 2021-08-07: qty 28, 28d supply, fill #1
  Filled 2021-09-04: qty 28, 28d supply, fill #2
  Filled 2021-10-02: qty 28, 28d supply, fill #3
  Filled 2021-11-03: qty 28, 28d supply, fill #4

## 2021-07-11 ENCOUNTER — Other Ambulatory Visit (HOSPITAL_COMMUNITY): Payer: Self-pay

## 2021-08-01 ENCOUNTER — Inpatient Hospital Stay: Payer: BC Managed Care – PPO | Attending: Oncology

## 2021-08-01 ENCOUNTER — Other Ambulatory Visit: Payer: Self-pay | Admitting: Oncology

## 2021-08-01 ENCOUNTER — Other Ambulatory Visit: Payer: Self-pay

## 2021-08-01 DIAGNOSIS — E119 Type 2 diabetes mellitus without complications: Secondary | ICD-10-CM | POA: Diagnosis not present

## 2021-08-01 DIAGNOSIS — G629 Polyneuropathy, unspecified: Secondary | ICD-10-CM | POA: Insufficient documentation

## 2021-08-01 DIAGNOSIS — C9111 Chronic lymphocytic leukemia of B-cell type in remission: Secondary | ICD-10-CM | POA: Insufficient documentation

## 2021-08-01 DIAGNOSIS — C9110 Chronic lymphocytic leukemia of B-cell type not having achieved remission: Secondary | ICD-10-CM

## 2021-08-01 DIAGNOSIS — Z79899 Other long term (current) drug therapy: Secondary | ICD-10-CM | POA: Insufficient documentation

## 2021-08-01 LAB — COMPREHENSIVE METABOLIC PANEL
ALT: 17 U/L (ref 0–44)
AST: 17 U/L (ref 15–41)
Albumin: 3.9 g/dL (ref 3.5–5.0)
Alkaline Phosphatase: 62 U/L (ref 38–126)
Anion gap: 6 (ref 5–15)
BUN: 22 mg/dL (ref 8–23)
CO2: 27 mmol/L (ref 22–32)
Calcium: 8.6 mg/dL — ABNORMAL LOW (ref 8.9–10.3)
Chloride: 100 mmol/L (ref 98–111)
Creatinine, Ser: 1.28 mg/dL — ABNORMAL HIGH (ref 0.61–1.24)
GFR, Estimated: >60 mL/min (ref >60)
Glucose, Bld: 316 mg/dL — ABNORMAL HIGH (ref 70–99)
Potassium: 4.5 mmol/L (ref 3.5–5.1)
Sodium: 133 mmol/L — ABNORMAL LOW (ref 135–145)
Total Bilirubin: 0.8 mg/dL (ref 0.3–1.2)
Total Protein: 6.3 g/dL — ABNORMAL LOW (ref 6.5–8.1)

## 2021-08-01 LAB — CBC
HCT: 46.9 % (ref 39.0–52.0)
Hemoglobin: 15.7 g/dL (ref 13.0–17.0)
MCH: 28 pg (ref 26.0–34.0)
MCHC: 33.5 g/dL (ref 30.0–36.0)
MCV: 83.8 fL (ref 80.0–100.0)
Platelets: 151 10*3/uL (ref 150–400)
RBC: 5.6 MIL/uL (ref 4.22–5.81)
RDW: 12.8 % (ref 11.5–15.5)
WBC: 6.8 10*3/uL (ref 4.0–10.5)
nRBC: 0 % (ref 0.0–0.2)

## 2021-08-04 ENCOUNTER — Other Ambulatory Visit (HOSPITAL_COMMUNITY): Payer: Self-pay

## 2021-08-05 LAB — COMP PANEL: LEUKEMIA/LYMPHOMA: Immunophenotypic Profile: 8

## 2021-08-07 ENCOUNTER — Other Ambulatory Visit (HOSPITAL_COMMUNITY): Payer: Self-pay

## 2021-09-02 ENCOUNTER — Telehealth: Payer: Self-pay | Admitting: Pharmacy Technician

## 2021-09-03 NOTE — Telephone Encounter (Signed)
Oral Oncology Patient Advocate Encounter   Received notification from [place] that the existing prior authorization for Travis [name] is due for renewal.   Renewal PA faxed to [phone] on 09/02/21 Status is pending   [facility] will continue to follow.  [place] Patient [place] Phone [phone] Fax [phone] 09/03/2021 9:13 AM

## 2021-09-03 NOTE — Telephone Encounter (Signed)
Oral Oncology Patient Advocate Encounter  Prior Authorization for Travis [name] has been approved.    PA# [id number] Effective dates: 09/02/21 through 09/02/22  Oral Oncology Clinic will continue to follow.   [name] Patient [name] Phone [phone] Fax [phone] 09/03/2021 9:13 AM

## 2021-09-04 ENCOUNTER — Other Ambulatory Visit (HOSPITAL_COMMUNITY): Payer: Self-pay

## 2021-09-09 ENCOUNTER — Other Ambulatory Visit (HOSPITAL_COMMUNITY): Payer: Self-pay

## 2021-09-15 ENCOUNTER — Telehealth: Payer: Self-pay | Admitting: *Deleted

## 2021-09-15 ENCOUNTER — Telehealth: Payer: Self-pay | Admitting: Oncology

## 2021-09-15 ENCOUNTER — Other Ambulatory Visit: Payer: Self-pay

## 2021-09-15 ENCOUNTER — Ambulatory Visit: Payer: BC Managed Care – PPO

## 2021-09-15 DIAGNOSIS — C9110 Chronic lymphocytic leukemia of B-cell type not having achieved remission: Secondary | ICD-10-CM

## 2021-09-15 NOTE — Telephone Encounter (Signed)
Patient called reporting a fifty percent decrease in his energy levels in the past 2 weeks. Please advise

## 2021-09-17 ENCOUNTER — Inpatient Hospital Stay (HOSPITAL_BASED_OUTPATIENT_CLINIC_OR_DEPARTMENT_OTHER): Payer: BC Managed Care – PPO | Admitting: Hospice and Palliative Medicine

## 2021-09-17 ENCOUNTER — Inpatient Hospital Stay: Payer: BC Managed Care – PPO | Attending: Oncology

## 2021-09-17 ENCOUNTER — Encounter: Payer: Self-pay | Admitting: Emergency Medicine

## 2021-09-17 ENCOUNTER — Other Ambulatory Visit: Payer: Self-pay

## 2021-09-17 ENCOUNTER — Emergency Department
Admission: EM | Admit: 2021-09-17 | Discharge: 2021-09-17 | Disposition: A | Discharge status: Home / Self Care | Payer: BC Managed Care – PPO | Attending: Emergency Medicine | Admitting: Emergency Medicine

## 2021-09-17 VITALS — BP 145/94 | HR 66 | Temp 97.8°F | Resp 18 | Wt 196.4 lb

## 2021-09-17 DIAGNOSIS — C9110 Chronic lymphocytic leukemia of B-cell type not having achieved remission: Secondary | ICD-10-CM

## 2021-09-17 DIAGNOSIS — N179 Acute kidney failure, unspecified: Secondary | ICD-10-CM

## 2021-09-17 DIAGNOSIS — Z8669 Personal history of other diseases of the nervous system and sense organs: Secondary | ICD-10-CM | POA: Insufficient documentation

## 2021-09-17 DIAGNOSIS — R339 Retention of urine, unspecified: Secondary | ICD-10-CM

## 2021-09-17 DIAGNOSIS — Z7984 Long term (current) use of oral hypoglycemic drugs: Secondary | ICD-10-CM | POA: Diagnosis not present

## 2021-09-17 DIAGNOSIS — R32 Unspecified urinary incontinence: Secondary | ICD-10-CM | POA: Diagnosis present

## 2021-09-17 DIAGNOSIS — R310 Gross hematuria: Secondary | ICD-10-CM | POA: Diagnosis not present

## 2021-09-17 DIAGNOSIS — E11628 Type 2 diabetes mellitus with other skin complications: Secondary | ICD-10-CM | POA: Insufficient documentation

## 2021-09-17 LAB — COMPREHENSIVE METABOLIC PANEL
ALT: 16 U/L (ref 0–44)
AST: 12 U/L — ABNORMAL LOW (ref 15–41)
Albumin: 3.8 g/dL (ref 3.5–5.0)
Alkaline Phosphatase: 61 U/L (ref 38–126)
Anion gap: 9 (ref 5–15)
BUN: 54 mg/dL — ABNORMAL HIGH (ref 8–23)
CO2: 24 mmol/L (ref 22–32)
Calcium: 8.8 mg/dL — ABNORMAL LOW (ref 8.9–10.3)
Chloride: 101 mmol/L (ref 98–111)
Creatinine, Ser: 3.8 mg/dL — ABNORMAL HIGH (ref 0.61–1.24)
GFR, Estimated: 17 mL/min — ABNORMAL LOW (ref >60)
Glucose, Bld: 288 mg/dL — ABNORMAL HIGH (ref 70–99)
Potassium: 4.9 mmol/L (ref 3.5–5.1)
Sodium: 134 mmol/L — ABNORMAL LOW (ref 135–145)
Total Bilirubin: 1 mg/dL (ref 0.3–1.2)
Total Protein: 5.9 g/dL — ABNORMAL LOW (ref 6.5–8.1)

## 2021-09-17 LAB — URINALYSIS, COMPLETE (UACMP) WITH MICROSCOPIC
Bacteria, UA: NONE SEEN
Bilirubin Urine: NEGATIVE
Glucose, UA: >=500 mg/dL — AB
Hgb urine dipstick: NEGATIVE
Ketones, ur: NEGATIVE mg/dL
Leukocytes,Ua: NEGATIVE
Nitrite: NEGATIVE
Protein, ur: NEGATIVE mg/dL
Specific Gravity, Urine: 1.007 (ref 1.005–1.030)
Squamous Epithelial / HPF: NONE SEEN (ref 0–5)
pH: 6 (ref 5.0–8.0)

## 2021-09-17 LAB — CBC WITH DIFFERENTIAL/PLATELET
Abs Immature Granulocytes: 0.13 10*3/uL — ABNORMAL HIGH (ref 0.00–0.07)
Basophils Absolute: 0.1 10*3/uL (ref 0.0–0.1)
Basophils Relative: 1 %
Eosinophils Absolute: 0.3 10*3/uL (ref 0.0–0.5)
Eosinophils Relative: 4 %
HCT: 43.7 % (ref 39.0–52.0)
Hemoglobin: 14.5 g/dL (ref 13.0–17.0)
Immature Granulocytes: 2 %
Lymphocytes Relative: 23 %
Lymphs Abs: 1.6 10*3/uL (ref 0.7–4.0)
MCH: 27.7 pg (ref 26.0–34.0)
MCHC: 33.2 g/dL (ref 30.0–36.0)
MCV: 83.6 fL (ref 80.0–100.0)
Monocytes Absolute: 0.6 10*3/uL (ref 0.1–1.0)
Monocytes Relative: 8 %
Neutro Abs: 4.5 10*3/uL (ref 1.7–7.7)
Neutrophils Relative %: 62 %
Platelets: 163 10*3/uL (ref 150–400)
RBC: 5.23 MIL/uL (ref 4.22–5.81)
RDW: 12.9 % (ref 11.5–15.5)
WBC: 7.2 10*3/uL (ref 4.0–10.5)
nRBC: 0 % (ref 0.0–0.2)

## 2021-09-17 LAB — BASIC METABOLIC PANEL
Anion gap: 8 (ref 5–15)
BUN: 47 mg/dL — ABNORMAL HIGH (ref 8–23)
CO2: 24 mmol/L (ref 22–32)
Calcium: 8.8 mg/dL — ABNORMAL LOW (ref 8.9–10.3)
Chloride: 103 mmol/L (ref 98–111)
Creatinine, Ser: 3.05 mg/dL — ABNORMAL HIGH (ref 0.61–1.24)
GFR, Estimated: 22 mL/min — ABNORMAL LOW (ref >60)
Glucose, Bld: 248 mg/dL — ABNORMAL HIGH (ref 70–99)
Potassium: 4.7 mmol/L (ref 3.5–5.1)
Sodium: 135 mmol/L (ref 135–145)

## 2021-09-17 MED ORDER — LIDOCAINE HCL URETHRAL/MUCOSAL 2 % EX GEL
1.0000 "application " | Freq: Once | CUTANEOUS | Status: AC
  Administered 2021-09-17: 1 via URETHRAL
  Filled 2021-09-17: qty 6

## 2021-09-17 NOTE — ED Provider Notes (Addendum)
[facility] Emergency Department Provider Note   ____________________________________________   Event Date/Time   First MD Initiated Contact with Patient 09/17/21 1340     (approximate)  I have reviewed the triage vital signs and the nursing notes.   HISTORY  Chief Complaint Abnormal Lab, Urinary Incontinence, and Abdominal Pain    HPI Travis [name]. is a 70 y.o. male with past medical history of CLL and diabetes who presents to the ED complaining of urinary incontinence.  Patient reports approximately 3 weeks of leaking of urine.  He states he is able to urinate normally on his own but between this seems to continually leak urine.  He has had to wear a diaper every day for the past 3 weeks, has started to notice firmness in his lower abdomen which is sore to touch.  He is not sure if he is able to fully empty his bladder when he urinates.  He denies any fevers, dysuria, or hematuria.  He has not had any nausea, vomiting, or diarrhea.  He was seen at his oncologist office today for regular follow-up, where labs showed an AKI.  He was referred to the ED for further evaluation.        Past Medical History:  Diagnosis Date   Diabetes mellitus without complication ([place])    Leukemia ([place])     Patient Active Problem List   Diagnosis Date Noted   Onychomycosis of multiple toenails with type 2 diabetes mellitus ([place]) 07/11/2018   Diabetes mellitus type 2, uncomplicated ([place]) 37/16/9678   Neuropathy associated with cancer ([place]) 05/30/2018   Vitamin D deficiency 04/07/2018   Varicose veins of bilateral lower extremities with pain 03/16/2018   Diabetes ([place]) 03/16/2018   CLL (chronic lymphocytic leukemia) ([place]) 04/02/2017    Past Surgical History:  Procedure Laterality Date   COLONOSCOPY     ESOPHAGOGASTRODUODENOSCOPY (EGD) WITH PROPOFOL N/A 03/14/2018   Procedure: ESOPHAGOGASTRODUODENOSCOPY (EGD) WITH PROPOFOL;  Surgeon: [name], MD;  Location[facility];  Service: Gastroenterology;  Laterality: N/A;   ESOPHAGOGASTRODUODENOSCOPY (EGD) WITH PROPOFOL N/A 06/13/2018   Procedure: ESOPHAGOGASTRODUODENOSCOPY (EGD) WITH PROPOFOL;  Surgeon: [name], MD;  [facility];  Service: Gastroenterology;  Laterality: N/A;   WISDOM TOOTH EXTRACTION      Prior to Admission medications   Medication Sig Start Date End Date Taking? Authorizing Provider  acetaminophen (TYLENOL) 500 MG tablet Take 2,000 mg by mouth as needed.     [provider]  benzonatate (TESSALON) 100 MG capsule Take 100 mg by mouth 3 (three) times daily as needed. 04/13/21   [provider]  Cholecalciferol (VITAMIN D3) 1000 units CAPS Take by mouth.    [provider]  citalopram (CELEXA) 20 MG tablet  02/11/21   [provider]  citalopram (CELEXA) 20 MG tablet Take by mouth. 02/13/21   [provider]  ibrutinib (IMBRUVICA) 420 MG TABS TAKE 1 TABLET BY MOUTH DAILY. 07/10/21 07/10/22  Lloyd Huger, MD  metFORMIN (GLUCOPHAGE) 1000 MG tablet Take by mouth.    [provider]  ondansetron (ZOFRAN-ODT) 4 MG disintegrating tablet Take 1 tablet by mouth as needed.  09/13/18   [provider]    Allergies Patient has no known allergies.  Family History  Problem Relation Age of Onset   Leukemia Mother    Colon cancer Father    Diabetes Father    Heart disease Father    Diabetes Brother     Social History Social History  Tobacco Use   Smoking status: Never   Smokeless tobacco: Never  Vaping Use   Vaping Use: Never used  Substance Use Topics   Alcohol use: Yes    Comment: rarely   Drug use: No    Review of Systems  Constitutional: No fever/chills Eyes: No visual changes. ENT: No sore throat. Cardiovascular: Denies chest pain. Respiratory: Denies shortness of breath. Gastrointestinal: Positive for abdominal distention and abdominal pain.  No nausea, no vomiting.  No diarrhea.  No  constipation. Genitourinary: Negative for dysuria.  Positive for urinary incontinence. Musculoskeletal: Negative for back pain. Skin: Negative for rash. Neurological: Negative for headaches, focal weakness or numbness.  ____________________________________________   PHYSICAL EXAM:  VITAL SIGNS: ED Triage Vitals  Enc Vitals Group     BP 09/17/21 1046 (!) 176/102     Pulse Rate 09/17/21 1046 70     Resp 09/17/21 1046 17     Temp 09/17/21 1046 97.7 F (36.5 C)     Temp Source 09/17/21 1046 Oral     SpO2 09/17/21 1046 100 %     Weight 09/17/21 1044 196 lb 6.9 oz (89.1 kg)     Height 09/17/21 1044 5\' 11"  (1.803 m)     Head Circumference --      Peak Flow --      Pain Score 09/17/21 1044 0     Pain Loc --      Pain Edu? --      Excl. in [place]? --     Constitutional: Alert and oriented. Eyes: Conjunctivae are normal. Head: Atraumatic. Nose: No congestion/rhinnorhea. Mouth/Throat: Mucous membranes are moist. Neck: Normal ROM Cardiovascular: Normal rate, regular rhythm. Grossly normal heart sounds. Respiratory: Normal respiratory effort.  No retractions. Lungs CTAB. Gastrointestinal: Soft and nontender. No distention.  Firm suprapubic area that is tender to palpation. Genitourinary: deferred Musculoskeletal: No lower extremity tenderness nor edema. Neurologic:  Normal speech and language. No gross focal neurologic deficits are appreciated. Skin:  Skin is warm, dry and intact. No rash noted. Psychiatric: Mood and affect are normal. Speech and behavior are normal.  ____________________________________________   LABS (all labs ordered are listed, but only abnormal results are displayed)  Labs Reviewed  URINALYSIS, COMPLETE (UACMP) WITH MICROSCOPIC - Abnormal; Notable for the following components:      Result Value   Color, Urine STRAW (*)    APPearance CLEAR (*)    Glucose, UA >=500 (*)    All other components within normal limits  BASIC METABOLIC PANEL - Abnormal; Notable  for the following components:   Glucose, Bld 248 (*)    BUN 47 (*)    Creatinine, Ser 3.05 (*)    Calcium 8.8 (*)    GFR, Estimated 22 (*)    All other components within normal limits    PROCEDURES  Procedure(s) performed (including Critical Care):  Procedures   ____________________________________________   INITIAL IMPRESSION / ASSESSMENT AND PLAN / ED COURSE      70 year old male with past medical history of CLL and diabetes presents to the ED complaining of urinary incontinence and firmness in his abdomen for the past 3 weeks.  Labs from this morning were reviewed and consistent with AKI, likely related to urinary obstruction.  Bladder scan shows greater than 1200 cc of urine and Foley catheter was placed with return of 1700 cc of clear yellow urine.  UA shows no signs of infection.  We will observe patient here in the ED to ensure urine is flowing appropriately,  recheck BMP to check for improvement in creatinine.  Repeat labs show downtrending creatinine and patient is appropriate for discharge home with urology and PCP follow-up for recheck of creatinine.  Patient did have some hematuria here in the ED which improved with irrigation.  He was counseled to return to the ED for any new or worsening symptoms, patient agrees with plan.  Patient noted to have significant ongoing hematuria at the time of discharge, passing a number of small clots.  Foley catheter was transitioned to three-way and we will perform irrigation to hopefully resolve hematuria.  If this is improving, patient may be appropriate for discharge home with urology follow-up, but if he has continued significant hematuria, he will require admission.      ____________________________________________   FINAL CLINICAL IMPRESSION(S) / ED DIAGNOSES  Final diagnoses:  Urinary retention  Gross hematuria  AKI (acute kidney injury) [facility])     ED Discharge Orders     None        Note:  This document was  prepared using Dragon voice recognition software and may include unintentional dictation errors.    Blake Divine, MD 09/17/21 1754    Blake Divine, MD 09/17/21 2011

## 2021-09-17 NOTE — Progress Notes (Signed)
Pt reports that he is feeling more fatigued than his baseline, stating "about half my usual". He also reports an increase in his incontinence, a having to use diapers 24 hours a day, and soaking the bed at night. He used to only wear depends during the day. Pt reports that these symptoms started around Labor Day.

## 2021-09-17 NOTE — ED Provider Notes (Signed)
Emergency Medicine Provider Triage Evaluation Note  Travis More. , a 70 y.o. male  was evaluated in triage.  Pt complains of urinary incontinence and creatinine of 3. Urinary symptoms started around Labor Day.  Review of Systems  Positive: Urinary incontinence Negative: Dysuria, abdominal pain  Physical Exam  There were no vitals taken for this visit. Gen:   Awake, no distress   Resp:  Normal effort  MSK:   Moves extremities without difficulty  Other:    Medical Decision Making  Medically screening exam initiated at 10:43 AM.  Appropriate orders placed.  Travis More. was informed that the remainder of the evaluation will be completed by another provider, this initial triage assessment does not replace that evaluation, and the importance of remaining in the ED until their evaluation is complete.   Travis Dike, Travis [name] 09/18/21 1351    Lucrezia Starch, MD 09/18/21 2220

## 2021-09-17 NOTE — ED Notes (Signed)
See triage note  presents with some diff urinating  bladder is distended

## 2021-09-17 NOTE — ED Notes (Signed)
70 ML return for CBI.

## 2021-09-17 NOTE — ED Triage Notes (Signed)
Pt comes into the ED via POV c/o new urinary incontinence, abnormal labs with his creatinine climbing to over 3 and new lower abd pain and hardening around his bladder.  Pt ambulatory to triage at this time and in NAD.

## 2021-09-17 NOTE — Progress Notes (Addendum)
Symptom Management [place]  Telephone:(336) [phone] Fax:(336) [phone]  Patient Care Team: Adin Hector, MD as PCP - General (Internal Medicine) Lucilla Lame, MD as Consulting Physician (Gastroenterology) Anabel Bene, MD as Referring Physician (Neurology)   Name of the patient: Travis [name]  [id number]  1951/01/08   Date of visit: 09/17/21  Reason for Consult:  Mr. Lisle Skillman is a 70 year old male with multiple medical problems including CLL currently in remission. He has been on Imbruvica since 2017 with plan to continue treatment through November 2022.  Patient presents to [facility] today with complaint of urinary incontinence starting Labor Day weekend.  He has been wearing diapers. He did not previously have any urinary incontinence prior to Labor Day.  He is unable to sense his need to void.  He has not had fecal incontinence.  No burning or urgency.  No foul odor or hematuria.  No nausea, vomiting, or diarrhea.  However, patient has also endorsed constipation since Labor Day.  He is having a bowel movement every few days but previously had a bowel movement daily.  No lower extremity weakness.  No B symptoms.  Patient has endorsed slight discomfort in the suprapubic area and feels an area that is hard to touch.  Denies any neurologic complaints. Denies recent fevers or illnesses. Denies any easy bleeding or bruising. Reports good appetite and denies weight loss. Denies chest pain. Denies any nausea, vomiting, , or diarrhea. Patient offers no further specific complaints today.  PAST MEDICAL HISTORY: Past Medical History:  Diagnosis Date   Diabetes mellitus without complication ([place])    Leukemia ([place])     PAST SURGICAL HISTORY:  Past Surgical History:  Procedure Laterality Date   COLONOSCOPY     ESOPHAGOGASTRODUODENOSCOPY (EGD) WITH PROPOFOL N/A 03/14/2018   Procedure: ESOPHAGOGASTRODUODENOSCOPY (EGD) WITH PROPOFOL;  Surgeon: [name], MD;  Location: [facility] ENDOSCOPY;  Service: Gastroenterology;  Laterality: N/A;   ESOPHAGOGASTRODUODENOSCOPY (EGD) WITH PROPOFOL N/A 06/13/2018   Procedure: ESOPHAGOGASTRODUODENOSCOPY (EGD) WITH PROPOFOL;  Surgeon: [name], MD;  Location: [facility] ENDOSCOPY;  Service: Gastroenterology;  Laterality: N/A;   WISDOM TOOTH EXTRACTION      HEMATOLOGY/ONCOLOGY HISTORY:  Oncology History   No history exists.    ALLERGIES:  has No Known Allergies.  MEDICATIONS:  Current Outpatient Medications  Medication Sig Dispense Refill   acetaminophen (TYLENOL) 500 MG tablet Take 2,000 mg by mouth as needed.      Cholecalciferol (VITAMIN D3) 1000 units CAPS Take by mouth.     citalopram (CELEXA) 20 MG tablet      ibrutinib (IMBRUVICA) 420 MG TABS TAKE 1 TABLET BY MOUTH DAILY. 28 tablet 5   benzonatate (TESSALON) 100 MG capsule Take 100 mg by mouth 3 (three) times daily as needed.     citalopram (CELEXA) 20 MG tablet Take by mouth.     metFORMIN (GLUCOPHAGE) 1000 MG tablet Take by mouth.     ondansetron (ZOFRAN-ODT) 4 MG disintegrating tablet Take 1 tablet by mouth as needed.      Current Facility-Administered Medications  Medication Dose Route Frequency Provider Last Rate Last Admin   0.9 %  sodium chloride infusion   Intravenous PRN [name], NP        VITAL SIGNS: BP (!) 145/94   Pulse 66   Temp 97.8 F (36.6 C) (Tympanic)   Resp 18   Wt 196 lb 7 oz (89.1 kg)   SpO2 100%   BMI 27.40 kg/m  [employer]  09/17/21 0958  Weight: 196 lb 7 oz (89.1 kg)    Estimated body mass index is 27.4 kg/m as calculated from the following:   Height as of 05/01/21: 5\' 11"  (1.803 m).   Weight as of this encounter: 196 lb 7 oz (89.1 kg).  LABS: CBC:    Component Value Date/Time   WBC 7.2 09/17/2021 0919   HGB 14.5 09/17/2021 0919   HCT 43.7 09/17/2021 0919   PLT 163 09/17/2021 0919   MCV 83.6 09/17/2021 0919   NEUTROABS 4.5 09/17/2021 0919   LYMPHSABS 1.6 09/17/2021 0919   MONOABS 0.6  09/17/2021 0919   EOSABS 0.3 09/17/2021 0919   BASOSABS 0.1 09/17/2021 0919   Comprehensive Metabolic Panel:    Component Value Date/Time   NA 134 (L) 09/17/2021 0919   K 4.9 09/17/2021 0919   CL 101 09/17/2021 0919   CO2 24 09/17/2021 0919   BUN 54 (H) 09/17/2021 0919   CREATININE 3.80 (H) 09/17/2021 0919   GLUCOSE 288 (H) 09/17/2021 0919   CALCIUM 8.8 (L) 09/17/2021 0919   AST 12 (L) 09/17/2021 0919   ALT 16 09/17/2021 0919   ALKPHOS 61 09/17/2021 0919   BILITOT 1.0 09/17/2021 0919   PROT 5.9 (L) 09/17/2021 0919   ALBUMIN 3.8 09/17/2021 0919    RADIOGRAPHIC STUDIES: No results found.  PERFORMANCE STATUS (ECOG) : 1 - Symptomatic but completely ambulatory  Review of Systems Unless otherwise noted, a complete review of systems is negative.  Physical Exam General: NAD Cardiovascular: regular rate and rhythm Pulmonary: clear ant fields Abdomen: soft, nontender, + bowel sounds,  GU: hard suprapubic mass suspect to be distended bladder Extremities: no edema, no joint deformities Skin: no rashes Neurological: Weakness but otherwise nonfocal  Assessment and Plan- Patient is a 70 y.o. male CLL in remission on Imbruvica for past 5 years who presents to [facility] with urinary incontinence and hard suprapubic mass  Acute renal failure -serum Creatinine has increased from 1.28-3.80 over the past month.  Renal labs were previously normal.  Patient has a hard bladder/mass on exam, now with urinary incontinence over the past 3 weeks.  Suspect obstructive uropathy.  Discussed with Dr. Grayland Ormond and patient sent to the ER for further work-up.    Patient expressed understanding and was in agreement with this plan. He also understands that He can call clinic at any time with any questions, concerns, or complaints.   Thank you for allowing me to participate in the care of this very pleasant patient.   Time Total: 20 minutes  Visit consisted of counseling and education dealing with the  complex and emotionally intense issues of symptom management and palliative care in the setting of serious and potentially life-threatening illness.Greater than 50%  of this time was spent counseling and coordinating care related to the above assessment and plan.  Signed by: Altha Harm, PhD, NP-C

## 2021-09-18 ENCOUNTER — Telehealth: Payer: Self-pay | Admitting: *Deleted

## 2021-09-18 NOTE — Telephone Encounter (Signed)
Pt is scheduled on 10/01/2021 for ER follow-up, new patient appt. Should this be a voiding trial? Pt is asking to be seen ASAP, he states he's passing clots in his catheter

## 2021-09-19 ENCOUNTER — Ambulatory Visit: Payer: BC Managed Care – PPO | Admitting: Urology

## 2021-09-19 ENCOUNTER — Other Ambulatory Visit
Admission: RE | Admit: 2021-09-19 | Discharge: 2021-09-19 | Disposition: A | Discharge status: Home / Self Care | Payer: BC Managed Care – PPO | Attending: Urology | Admitting: Urology

## 2021-09-19 ENCOUNTER — Other Ambulatory Visit: Payer: Self-pay

## 2021-09-19 ENCOUNTER — Encounter: Payer: Self-pay | Admitting: Urology

## 2021-09-19 VITALS — BP 127/87 | HR 88 | Ht 71.0 in | Wt 185.0 lb

## 2021-09-19 DIAGNOSIS — N289 Disorder of kidney and ureter, unspecified: Secondary | ICD-10-CM | POA: Diagnosis not present

## 2021-09-19 DIAGNOSIS — N39490 Overflow incontinence: Secondary | ICD-10-CM | POA: Diagnosis present

## 2021-09-19 DIAGNOSIS — R310 Gross hematuria: Secondary | ICD-10-CM | POA: Diagnosis present

## 2021-09-19 DIAGNOSIS — R338 Other retention of urine: Secondary | ICD-10-CM | POA: Insufficient documentation

## 2021-09-19 LAB — CBC
HCT: 43.7 % (ref 39.0–52.0)
Hemoglobin: 15 g/dL (ref 13.0–17.0)
MCH: 28.1 pg (ref 26.0–34.0)
MCHC: 34.3 g/dL (ref 30.0–36.0)
MCV: 82 fL (ref 80.0–100.0)
Platelets: 205 10*3/uL (ref 150–400)
RBC: 5.33 MIL/uL (ref 4.22–5.81)
RDW: 13.3 % (ref 11.5–15.5)
WBC: 10.6 10*3/uL — ABNORMAL HIGH (ref 4.0–10.5)
nRBC: 0 % (ref 0.0–0.2)

## 2021-09-19 LAB — BASIC METABOLIC PANEL
Anion gap: 6 (ref 5–15)
BUN: 18 mg/dL (ref 8–23)
CO2: 25 mmol/L (ref 22–32)
Calcium: 8.5 mg/dL — ABNORMAL LOW (ref 8.9–10.3)
Chloride: 100 mmol/L (ref 98–111)
Creatinine, Ser: 1.29 mg/dL — ABNORMAL HIGH (ref 0.61–1.24)
GFR, Estimated: >60 mL/min (ref >60)
Glucose, Bld: 355 mg/dL — ABNORMAL HIGH (ref 70–99)
Potassium: 4.7 mmol/L (ref 3.5–5.1)
Sodium: 131 mmol/L — ABNORMAL LOW (ref 135–145)

## 2021-09-19 LAB — PSA: Prostatic Specific Antigen: 4.79 ng/mL — ABNORMAL HIGH (ref 0.00–4.00)

## 2021-09-19 MED ORDER — TAMSULOSIN HCL 0.4 MG PO CAPS: 0.4000 mg | ORAL_CAPSULE | Freq: Every day | ORAL | 5 refills | Status: DC
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
## 2021-09-19 NOTE — Telephone Encounter (Signed)
I will see him today in [place] if he is willing to assess the degree of his hematuria but probably won't VT until early next week heading into the weekend.  Hollice Espy, MD

## 2021-09-19 NOTE — Telephone Encounter (Signed)
Contacted patient-scheduled appointment for today. Voiced understanding location and time.

## 2021-09-25 ENCOUNTER — Ambulatory Visit: Payer: BC Managed Care – PPO | Admitting: Physician Assistant

## 2021-09-25 ENCOUNTER — Encounter: Payer: Self-pay | Admitting: Physician Assistant

## 2021-09-25 ENCOUNTER — Other Ambulatory Visit: Payer: Self-pay

## 2021-09-25 VITALS — BP 108/71 | HR 103 | Ht 71.0 in | Wt 185.0 lb

## 2021-09-25 DIAGNOSIS — N472 Paraphimosis: Secondary | ICD-10-CM

## 2021-09-25 DIAGNOSIS — R338 Other retention of urine: Secondary | ICD-10-CM | POA: Diagnosis not present

## 2021-09-25 LAB — BLADDER SCAN AMB NON-IMAGING

## 2021-09-25 MED ORDER — SILODOSIN 8 MG PO CAPS: 8.0000 mg | ORAL_CAPSULE | Freq: Every day | ORAL | 0 refills | Status: DC

## 2021-09-25 NOTE — Patient Instructions (Signed)
Stop Flomax and switch to Rapaflo (silodosin). I've sent this prescription to [facility], however if you get there and the medication is too expensive, please either call our clinic or send me a MyChart message and I'll send it to [place] instead. I provided you with a coupon today for [facility] to bring your cost down there if needed.     Step 1 Get all of your supplies ready and place near you. Step 2 Wash your hands, or put on gloves. Step 3 Wash around the tip of your penis with warm antibacterial soapy water. Step 4 Take catheter out of package and drain the lubricant over toilet. Step 5 While holding the penis at a 45 degree angle from the stomach in one hand and the catheter in the other hand  Step 6 Insert the catheter slowly into your urethra. If there is resistance when the catheter reaches the sphincter muscle,              take a deep breath and gently apply steady pressure.              DO NOT FORCE THE CATHETER Step 7 When the urine begins to flow insert another inch and lower penis. Allow the urine to flow into the toilet. Step 8 When the flow of urine stops, slowly remove the catheter.
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
## 2021-09-26 ENCOUNTER — Ambulatory Visit: Payer: BC Managed Care – PPO | Admitting: Physician Assistant

## 2021-09-26 ENCOUNTER — Encounter: Payer: Self-pay | Admitting: Physician Assistant

## 2021-09-26 VITALS — BP 107/72 | HR 105 | Ht 71.0 in | Wt 185.0 lb

## 2021-09-26 DIAGNOSIS — R338 Other retention of urine: Secondary | ICD-10-CM | POA: Diagnosis not present

## 2021-09-26 LAB — BLADDER SCAN AMB NON-IMAGING

## 2021-09-26 NOTE — Progress Notes (Signed)
Patient presented to clinic today for repeat PVR. He reports he has only been able to pass small dribbles of urine since leaving clinic yesterday.  He has successfully self cathed multiple times, most recently approximately 3 hours ago.  He has noted some blood at the catheter tip upon removal, however he has not had gross hematuria.  He denies lower abdominal pain, but states he does feel a difference in abdominal pressure when his bladder is fully empty.  Notably, he has switched to silodosin and has noticed an improvement without resolution of dizziness, but stable nausea on this medication. PVR 545mL.  Results for orders placed or performed in visit on 09/26/21  BLADDER SCAN AMB NON-IMAGING  Result Value Ref Range   Scan Result 53ml     Voiding trial failed.  I offered the patient Foley catheter replacement versus continued self-catheterization and he elected for the latter.  Given his poor tolerance of alpha blockers, will stop silodosin at this point and I have recommended that he undergo cystoscopy TRUS with Dr. Erlene Quan for evaluation of likely bladder outlet obstruction.  Patient is in agreement with this plan.  We discussed continuing self-catheterization at least 4 times daily, with him titrating his frequency to keep his average urinary output below 350 mL.  Additional samples provided today and we have placed a [employer] order for him to receive a shipment of 14Fr coude SpeediCath Standard catheters at his home.  Follow up: Return in about 2 weeks (around 10/10/2021) for Cysto and TRUS with Dr. Erlene Quan.   Debroah Loop, PA-C 09/26/21 11:42 AM  I spent 20 minutes on the day of the encounter to include pre-visit record review, face-to-face time with the patient, and post-visit ordering of tests.

## 2021-09-26 NOTE — Patient Instructions (Signed)
Stop silodosin (Rapaflo). Start self-cathing 4 times daily. If you notice that you are consistently getting >311mL of urine out, then increase your frequency of self-catheterization to keep your average output below this threshold.     Step 1 Get all of your supplies ready and place near you. Step 2 Wash your hands, or put on gloves. Step 3 Wash around the tip of your penis with warm antibacterial soapy water. Step 4 Take catheter out of package and drain the lubricant over toilet. Step 5 While holding the penis at a 45 degree angle from the stomach in one hand and the catheter in the other hand  Step 6 Insert the catheter slowly into your urethra. If there is resistance when the catheter reaches the sphincter muscle,              take a deep breath and gently apply steady pressure.              DO NOT FORCE THE CATHETER Step 7 When the urine begins to flow insert another inch and lower penis. Allow the urine to flow into the toilet. Step 8 When the flow of urine stops, slowly remove the catheter.

## 2021-09-28 ENCOUNTER — Emergency Department
Admission: EM | Admit: 2021-09-28 | Discharge: 2021-09-28 | Disposition: A | Discharge status: Home / Self Care | Payer: BC Managed Care – PPO | Attending: Emergency Medicine | Admitting: Emergency Medicine

## 2021-09-28 ENCOUNTER — Encounter: Payer: Self-pay | Admitting: Physician Assistant

## 2021-09-28 DIAGNOSIS — E119 Type 2 diabetes mellitus without complications: Secondary | ICD-10-CM | POA: Insufficient documentation

## 2021-09-28 DIAGNOSIS — C9110 Chronic lymphocytic leukemia of B-cell type not having achieved remission: Secondary | ICD-10-CM | POA: Insufficient documentation

## 2021-09-28 DIAGNOSIS — Z466 Encounter for fitting and adjustment of urinary device: Secondary | ICD-10-CM | POA: Diagnosis not present

## 2021-09-28 DIAGNOSIS — R339 Retention of urine, unspecified: Secondary | ICD-10-CM | POA: Diagnosis present

## 2021-09-28 NOTE — Discharge Instructions (Addendum)
Follow-up with your urologist for ongoing management.
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
## 2021-09-28 NOTE — ED Notes (Signed)
Pt straight cathed due to not being able to cath since 3am. Second was [place], [place]. Tolerated well. 84ml output.

## 2021-09-29 ENCOUNTER — Telehealth: Payer: Self-pay
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
## 2021-10-01 ENCOUNTER — Ambulatory Visit: Payer: BC Managed Care – PPO | Admitting: Urology

## 2021-10-01 NOTE — Telephone Encounter (Signed)
Attempted again to reach patient, no answer

## 2021-10-02 ENCOUNTER — Other Ambulatory Visit (HOSPITAL_COMMUNITY): Payer: Self-pay

## 2021-10-05 ENCOUNTER — Emergency Department: Payer: BC Managed Care – PPO

## 2021-10-05 ENCOUNTER — Other Ambulatory Visit: Payer: Self-pay

## 2021-10-05 ENCOUNTER — Encounter: Payer: Self-pay | Admitting: Emergency Medicine

## 2021-10-05 ENCOUNTER — Inpatient Hospital Stay: Payer: BC Managed Care – PPO

## 2021-10-05 ENCOUNTER — Inpatient Hospital Stay
Admission: EM | Admit: 2021-10-05 | Discharge: 2021-10-11 | DRG: 871 | Disposition: A | Discharge status: Home Health | Payer: BC Managed Care – PPO | Attending: Family Medicine | Admitting: Family Medicine

## 2021-10-05 DIAGNOSIS — K209 Esophagitis, unspecified without bleeding: Secondary | ICD-10-CM | POA: Diagnosis not present

## 2021-10-05 DIAGNOSIS — R739 Hyperglycemia, unspecified: Secondary | ICD-10-CM | POA: Diagnosis not present

## 2021-10-05 DIAGNOSIS — K2100 Gastro-esophageal reflux disease with esophagitis, without bleeding: Secondary | ICD-10-CM | POA: Diagnosis present

## 2021-10-05 DIAGNOSIS — K269 Duodenal ulcer, unspecified as acute or chronic, without hemorrhage or perforation: Secondary | ICD-10-CM | POA: Diagnosis present

## 2021-10-05 DIAGNOSIS — K253 Acute gastric ulcer without hemorrhage or perforation: Secondary | ICD-10-CM | POA: Diagnosis not present

## 2021-10-05 DIAGNOSIS — Z833 Family history of diabetes mellitus: Secondary | ICD-10-CM

## 2021-10-05 DIAGNOSIS — G629 Polyneuropathy, unspecified: Secondary | ICD-10-CM | POA: Diagnosis present

## 2021-10-05 DIAGNOSIS — C9110 Chronic lymphocytic leukemia of B-cell type not having achieved remission: Secondary | ICD-10-CM | POA: Diagnosis present

## 2021-10-05 DIAGNOSIS — C9111 Chronic lymphocytic leukemia of B-cell type in remission: Secondary | ICD-10-CM | POA: Diagnosis present

## 2021-10-05 DIAGNOSIS — K5710 Diverticulosis of small intestine without perforation or abscess without bleeding: Secondary | ICD-10-CM | POA: Diagnosis present

## 2021-10-05 DIAGNOSIS — E871 Hypo-osmolality and hyponatremia: Secondary | ICD-10-CM | POA: Diagnosis present

## 2021-10-05 DIAGNOSIS — F32A Depression, unspecified: Secondary | ICD-10-CM | POA: Diagnosis present

## 2021-10-05 DIAGNOSIS — E860 Dehydration: Secondary | ICD-10-CM | POA: Diagnosis present

## 2021-10-05 DIAGNOSIS — R911 Solitary pulmonary nodule: Secondary | ICD-10-CM | POA: Diagnosis not present

## 2021-10-05 DIAGNOSIS — G9341 Metabolic encephalopathy: Secondary | ICD-10-CM | POA: Diagnosis present

## 2021-10-05 DIAGNOSIS — N139 Obstructive and reflux uropathy, unspecified: Secondary | ICD-10-CM | POA: Diagnosis not present

## 2021-10-05 DIAGNOSIS — E870 Hyperosmolality and hypernatremia: Secondary | ICD-10-CM

## 2021-10-05 DIAGNOSIS — Z7984 Long term (current) use of oral hypoglycemic drugs: Secondary | ICD-10-CM

## 2021-10-05 DIAGNOSIS — B9562 Methicillin resistant Staphylococcus aureus infection as the cause of diseases classified elsewhere: Secondary | ICD-10-CM | POA: Diagnosis not present

## 2021-10-05 DIAGNOSIS — I83813 Varicose veins of bilateral lower extremities with pain: Secondary | ICD-10-CM | POA: Diagnosis present

## 2021-10-05 DIAGNOSIS — R64 Cachexia: Secondary | ICD-10-CM | POA: Diagnosis present

## 2021-10-05 DIAGNOSIS — Z23 Encounter for immunization: Secondary | ICD-10-CM | POA: Diagnosis not present

## 2021-10-05 DIAGNOSIS — K222 Esophageal obstruction: Secondary | ICD-10-CM | POA: Diagnosis present

## 2021-10-05 DIAGNOSIS — E559 Vitamin D deficiency, unspecified: Secondary | ICD-10-CM | POA: Diagnosis present

## 2021-10-05 DIAGNOSIS — N179 Acute kidney failure, unspecified: Secondary | ICD-10-CM | POA: Diagnosis present

## 2021-10-05 DIAGNOSIS — Z79899 Other long term (current) drug therapy: Secondary | ICD-10-CM

## 2021-10-05 DIAGNOSIS — Z20822 Contact with and (suspected) exposure to covid-19: Secondary | ICD-10-CM | POA: Diagnosis present

## 2021-10-05 DIAGNOSIS — C801 Malignant (primary) neoplasm, unspecified: Secondary | ICD-10-CM | POA: Diagnosis present

## 2021-10-05 DIAGNOSIS — A419 Sepsis, unspecified organism: Secondary | ICD-10-CM | POA: Diagnosis not present

## 2021-10-05 DIAGNOSIS — R319 Hematuria, unspecified: Secondary | ICD-10-CM

## 2021-10-05 DIAGNOSIS — D72829 Elevated white blood cell count, unspecified: Secondary | ICD-10-CM

## 2021-10-05 DIAGNOSIS — B952 Enterococcus as the cause of diseases classified elsewhere: Secondary | ICD-10-CM | POA: Diagnosis present

## 2021-10-05 DIAGNOSIS — E1110 Type 2 diabetes mellitus with ketoacidosis without coma: Secondary | ICD-10-CM

## 2021-10-05 DIAGNOSIS — E119 Type 2 diabetes mellitus without complications: Secondary | ICD-10-CM | POA: Diagnosis not present

## 2021-10-05 DIAGNOSIS — G63 Polyneuropathy in diseases classified elsewhere: Secondary | ICD-10-CM

## 2021-10-05 DIAGNOSIS — N390 Urinary tract infection, site not specified: Secondary | ICD-10-CM | POA: Diagnosis present

## 2021-10-05 DIAGNOSIS — D849 Immunodeficiency, unspecified: Secondary | ICD-10-CM | POA: Diagnosis present

## 2021-10-05 DIAGNOSIS — A4102 Sepsis due to Methicillin resistant Staphylococcus aureus: Principal | ICD-10-CM | POA: Diagnosis present

## 2021-10-05 DIAGNOSIS — E872 Acidosis, unspecified: Secondary | ICD-10-CM | POA: Diagnosis not present

## 2021-10-05 DIAGNOSIS — R7881 Bacteremia: Secondary | ICD-10-CM

## 2021-10-05 DIAGNOSIS — Z6825 Body mass index (BMI) 25.0-25.9, adult: Secondary | ICD-10-CM

## 2021-10-05 DIAGNOSIS — R6520 Severe sepsis without septic shock: Secondary | ICD-10-CM | POA: Diagnosis present

## 2021-10-05 DIAGNOSIS — K317 Polyp of stomach and duodenum: Secondary | ICD-10-CM | POA: Diagnosis present

## 2021-10-05 DIAGNOSIS — K449 Diaphragmatic hernia without obstruction or gangrene: Secondary | ICD-10-CM | POA: Diagnosis not present

## 2021-10-05 DIAGNOSIS — R1319 Other dysphagia: Secondary | ICD-10-CM | POA: Diagnosis not present

## 2021-10-05 DIAGNOSIS — Z22322 Carrier or suspected carrier of Methicillin resistant Staphylococcus aureus: Secondary | ICD-10-CM

## 2021-10-05 DIAGNOSIS — Z8249 Family history of ischemic heart disease and other diseases of the circulatory system: Secondary | ICD-10-CM

## 2021-10-05 DIAGNOSIS — R531 Weakness: Secondary | ICD-10-CM

## 2021-10-05 DIAGNOSIS — E440 Moderate protein-calorie malnutrition: Secondary | ICD-10-CM | POA: Insufficient documentation

## 2021-10-05 DIAGNOSIS — Z806 Family history of leukemia: Secondary | ICD-10-CM

## 2021-10-05 DIAGNOSIS — F419 Anxiety disorder, unspecified: Secondary | ICD-10-CM | POA: Diagnosis present

## 2021-10-05 DIAGNOSIS — I340 Nonrheumatic mitral (valve) insufficiency: Secondary | ICD-10-CM | POA: Diagnosis not present

## 2021-10-05 LAB — CBC WITH DIFFERENTIAL/PLATELET
Abs Immature Granulocytes: 2.11 10*3/uL — ABNORMAL HIGH (ref 0.00–0.07)
Basophils Absolute: 0 10*3/uL (ref 0.0–0.1)
Basophils Relative: 0 %
Eosinophils Absolute: 0 10*3/uL (ref 0.0–0.5)
Eosinophils Relative: 0 %
HCT: 43.2 % (ref 39.0–52.0)
Hemoglobin: 14.7 g/dL (ref 13.0–17.0)
Immature Granulocytes: 7 %
Lymphocytes Relative: 3 %
Lymphs Abs: 1 10*3/uL (ref 0.7–4.0)
MCH: 28.7 pg (ref 26.0–34.0)
MCHC: 34 g/dL (ref 30.0–36.0)
MCV: 84.2 fL (ref 80.0–100.0)
Monocytes Absolute: 2 10*3/uL — ABNORMAL HIGH (ref 0.1–1.0)
Monocytes Relative: 6 %
Neutro Abs: 27.5 10*3/uL — ABNORMAL HIGH (ref 1.7–7.7)
Neutrophils Relative %: 84 %
Platelets: 326 10*3/uL (ref 150–400)
RBC: 5.13 MIL/uL (ref 4.22–5.81)
RDW: 13.9 % (ref 11.5–15.5)
WBC: 32.7 10*3/uL — ABNORMAL HIGH (ref 4.0–10.5)
nRBC: 0 % (ref 0.0–0.2)

## 2021-10-05 LAB — MRSA NEXT GEN BY PCR, NASAL: MRSA by PCR Next Gen: DETECTED — AB

## 2021-10-05 LAB — BETA-HYDROXYBUTYRIC ACID
Beta-Hydroxybutyric Acid: 6.04 mmol/L — ABNORMAL HIGH (ref 0.05–0.27)
Beta-Hydroxybutyric Acid: 0.69 mmol/L — ABNORMAL HIGH (ref 0.05–0.27)

## 2021-10-05 LAB — BASIC METABOLIC PANEL
Anion gap: 20 — ABNORMAL HIGH (ref 5–15)
Anion gap: 11 (ref 5–15)
Anion gap: 6 (ref 5–15)
BUN: 42 mg/dL — ABNORMAL HIGH (ref 8–23)
BUN: 38 mg/dL — ABNORMAL HIGH (ref 8–23)
BUN: 34 mg/dL — ABNORMAL HIGH (ref 8–23)
CO2: 12 mmol/L — ABNORMAL LOW (ref 22–32)
CO2: 16 mmol/L — ABNORMAL LOW (ref 22–32)
CO2: 17 mmol/L — ABNORMAL LOW (ref 22–32)
Calcium: 9.2 mg/dL (ref 8.9–10.3)
Calcium: 8.2 mg/dL — ABNORMAL LOW (ref 8.9–10.3)
Calcium: 8.2 mg/dL — ABNORMAL LOW (ref 8.9–10.3)
Chloride: 96 mmol/L — ABNORMAL LOW (ref 98–111)
Chloride: 105 mmol/L (ref 98–111)
Chloride: 108 mmol/L (ref 98–111)
Creatinine, Ser: 1.68 mg/dL — ABNORMAL HIGH (ref 0.61–1.24)
Creatinine, Ser: 1.36 mg/dL — ABNORMAL HIGH (ref 0.61–1.24)
Creatinine, Ser: 0.98 mg/dL (ref 0.61–1.24)
GFR, Estimated: 44 mL/min — ABNORMAL LOW (ref >60)
GFR, Estimated: 56 mL/min — ABNORMAL LOW (ref >60)
GFR, Estimated: >60 mL/min (ref >60)
Glucose, Bld: 511 mg/dL (ref 70–99)
Glucose, Bld: 406 mg/dL — ABNORMAL HIGH (ref 70–99)
Glucose, Bld: 202 mg/dL — ABNORMAL HIGH (ref 70–99)
Potassium: 4.9 mmol/L (ref 3.5–5.1)
Potassium: 4.2 mmol/L (ref 3.5–5.1)
Potassium: 4 mmol/L (ref 3.5–5.1)
Sodium: 128 mmol/L — ABNORMAL LOW (ref 135–145)
Sodium: 132 mmol/L — ABNORMAL LOW (ref 135–145)
Sodium: 131 mmol/L — ABNORMAL LOW (ref 135–145)

## 2021-10-05 LAB — GLUCOSE, CAPILLARY
Glucose-Capillary: 410 mg/dL — ABNORMAL HIGH (ref 70–99)
Glucose-Capillary: 456 mg/dL — ABNORMAL HIGH (ref 70–99)
Glucose-Capillary: 356 mg/dL — ABNORMAL HIGH (ref 70–99)
Glucose-Capillary: 279 mg/dL — ABNORMAL HIGH (ref 70–99)
Glucose-Capillary: 177 mg/dL — ABNORMAL HIGH (ref 70–99)
Glucose-Capillary: 216 mg/dL — ABNORMAL HIGH (ref 70–99)
Glucose-Capillary: 193 mg/dL — ABNORMAL HIGH (ref 70–99)
Glucose-Capillary: 185 mg/dL — ABNORMAL HIGH (ref 70–99)
Glucose-Capillary: 215 mg/dL — ABNORMAL HIGH (ref 70–99)

## 2021-10-05 LAB — CBC
HCT: 47.1 % (ref 39.0–52.0)
Hemoglobin: 15.7 g/dL (ref 13.0–17.0)
MCH: 27.4 pg (ref 26.0–34.0)
MCHC: 33.3 g/dL (ref 30.0–36.0)
MCV: 82.2 fL (ref 80.0–100.0)
Platelets: 416 10*3/uL — ABNORMAL HIGH (ref 150–400)
RBC: 5.73 MIL/uL (ref 4.22–5.81)
RDW: 14 % (ref 11.5–15.5)
WBC: 38.9 10*3/uL — ABNORMAL HIGH (ref 4.0–10.5)
nRBC: 0 % (ref 0.0–0.2)

## 2021-10-05 LAB — URINALYSIS, COMPLETE (UACMP) WITH MICROSCOPIC
Bilirubin Urine: NEGATIVE
Glucose, UA: >=500 mg/dL — AB
Ketones, ur: 80 mg/dL — AB
Nitrite: NEGATIVE
Protein, ur: 100 mg/dL — AB
Specific Gravity, Urine: 1.018 (ref 1.005–1.030)
WBC, UA: >50 WBC/hpf — ABNORMAL HIGH (ref 0–5)
pH: 5 (ref 5.0–8.0)

## 2021-10-05 LAB — LACTIC ACID, PLASMA
Lactic Acid, Venous: 2.9 mmol/L (ref 0.5–1.9)
Lactic Acid, Venous: 2.2 mmol/L (ref 0.5–1.9)

## 2021-10-05 LAB — RESP PANEL BY RT-PCR (FLU A&B, COVID) ARPGX2
Influenza A by PCR: NEGATIVE
Influenza B by PCR: NEGATIVE
SARS Coronavirus 2 by RT PCR: NEGATIVE

## 2021-10-05 LAB — HEPATIC FUNCTION PANEL
ALT: 15 U/L (ref 0–44)
AST: 19 U/L (ref 15–41)
Albumin: 2.5 g/dL — ABNORMAL LOW (ref 3.5–5.0)
Alkaline Phosphatase: 137 U/L — ABNORMAL HIGH (ref 38–126)
Bilirubin, Direct: 0.2 mg/dL (ref 0.0–0.2)
Indirect Bilirubin: 1.3 mg/dL — ABNORMAL HIGH (ref 0.3–0.9)
Total Bilirubin: 1.5 mg/dL — ABNORMAL HIGH (ref 0.3–1.2)
Total Protein: 6.2 g/dL — ABNORMAL LOW (ref 6.5–8.1)

## 2021-10-05 LAB — CBG MONITORING, ED
Glucose-Capillary: 469 mg/dL — ABNORMAL HIGH (ref 70–99)
Glucose-Capillary: 440 mg/dL — ABNORMAL HIGH (ref 70–99)
Glucose-Capillary: 428 mg/dL — ABNORMAL HIGH (ref 70–99)

## 2021-10-05 LAB — PHOSPHORUS: Phosphorus: 5.3 mg/dL — ABNORMAL HIGH (ref 2.5–4.6)

## 2021-10-05 LAB — URIC ACID: Uric Acid, Serum: 10 mg/dL — ABNORMAL HIGH (ref 3.7–8.6)

## 2021-10-05 LAB — CK: Total CK: 18 U/L — ABNORMAL LOW (ref 49–397)

## 2021-10-05 LAB — PROCALCITONIN: Procalcitonin: 1.07 ng/mL

## 2021-10-05 IMAGING — DX DG CHEST 1V PORT
1 series · 1 of 1 positions shown · non-contrast
Comparison: None.

CLINICAL DATA: Weakness

EXAM:
PORTABLE CHEST 1 VIEW

[chest ap]
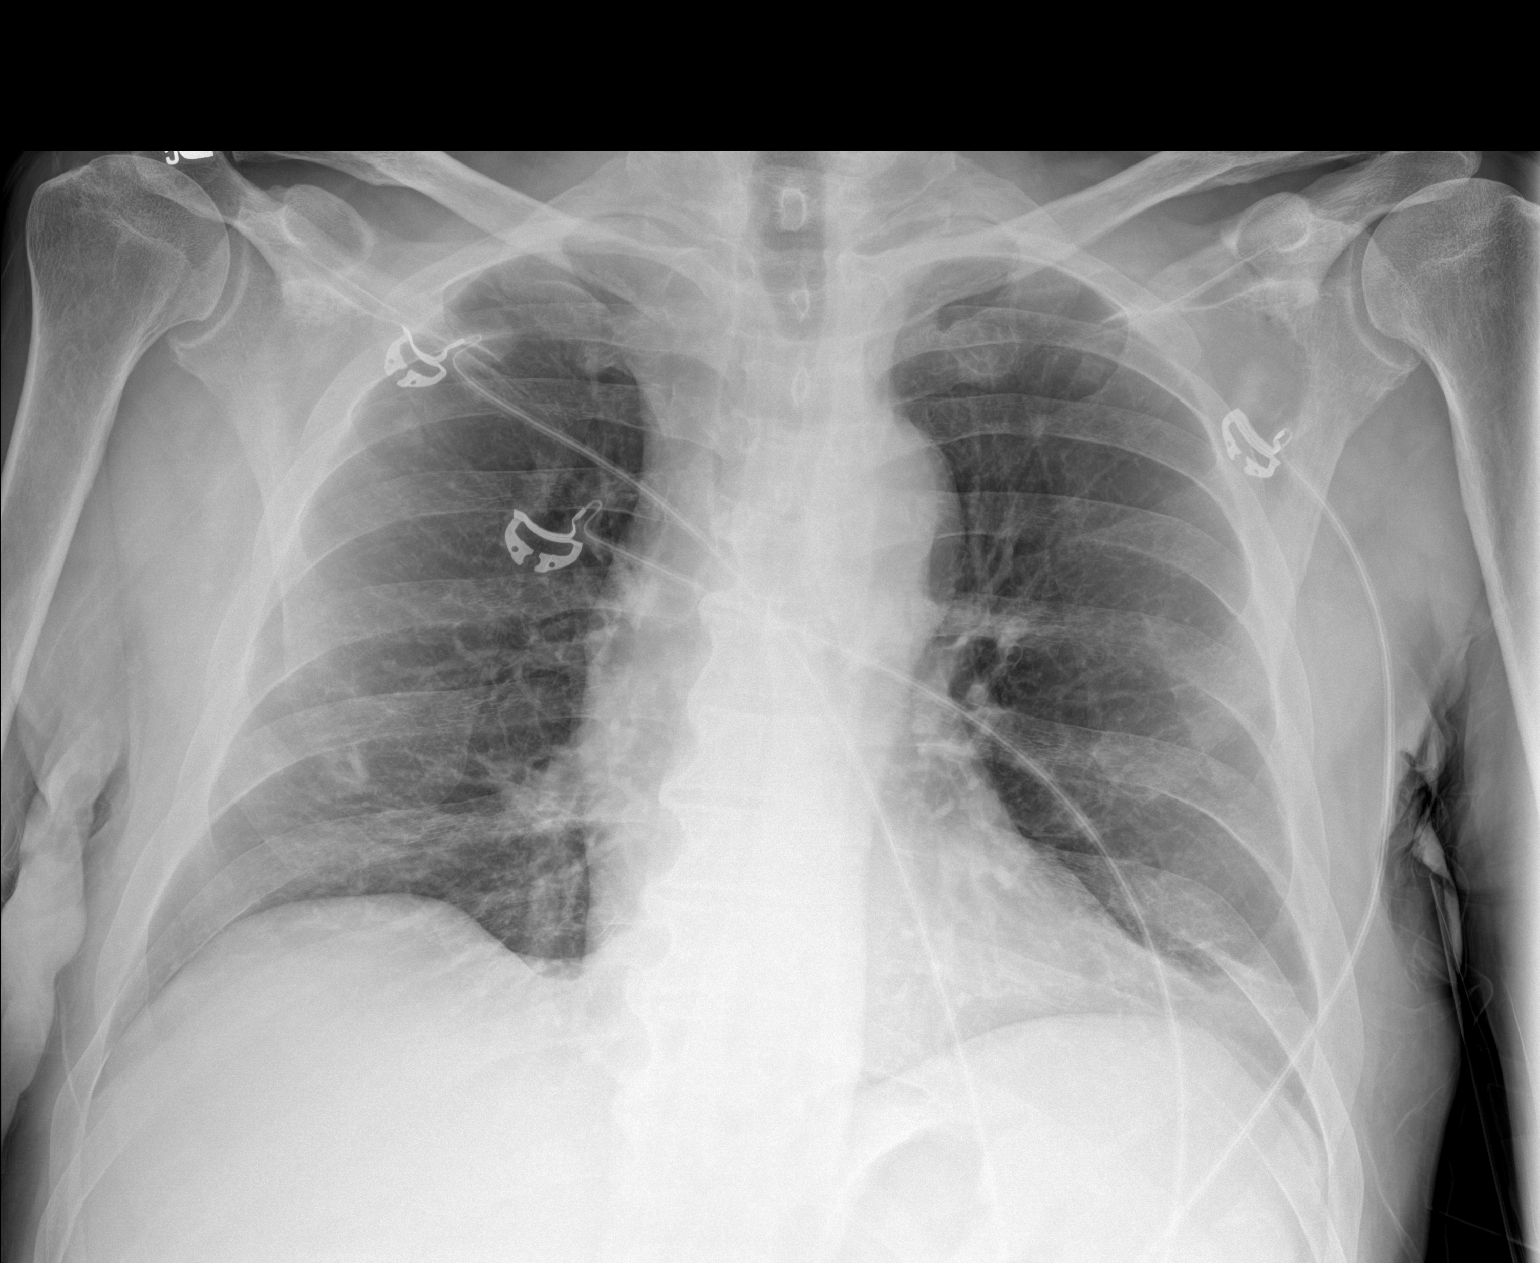

[1 of 1 positions shown; findings below may reference images not displayed]

FINDINGS: Normal heart size. Normal mediastinal contour. No pneumothorax. No
pleural effusion. Two small indistinct nodular opacities in the
right lung measuring 7 mm in the upper right lung and 8 mm in the
lower right lung. Clear left lung.
IMPRESSION: Two small indistinct nodular opacities in the right lung. Chest CT
with IV contrast recommended to evaluate for pulmonary nodules.

## 2021-10-05 IMAGING — MR MR HEAD W/O CM
12 series · 47 of 48 positions shown · non-contrast
Comparison: Head CT [DATE]

CLINICAL DATA: Acute neurologic deficit

EXAM:
MRI HEAD WITHOUT CONTRAST
TECHNIQUE: Multiplanar, multiecho pulse sequences of the brain and surrounding
structures were obtained without intravenous contrast.

[Series 5: ax dwi_tracew · axial · 3.0mm · 0.65mm/px · z∈[-59,+89]mm · 5 of 46 slices shown]
[im 1/46]
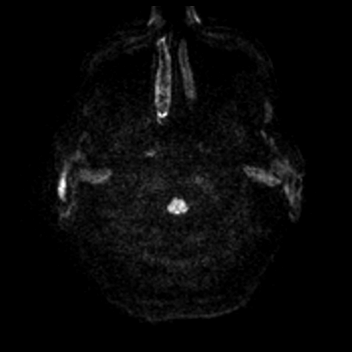
[im 12/46]
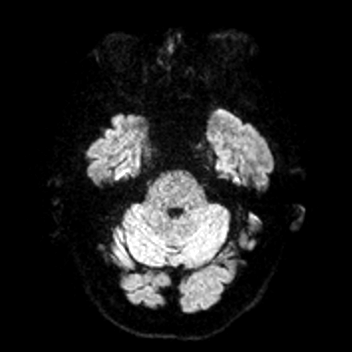
[im 23/46]
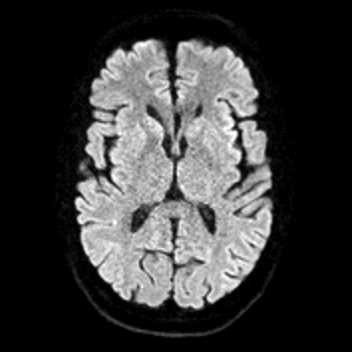
[im 34/46]
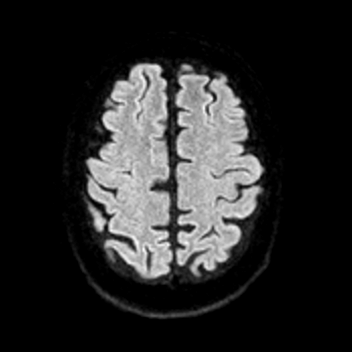
[im 46/46]
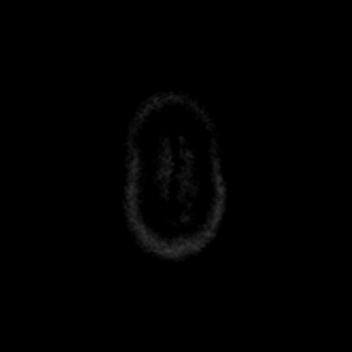

[Series 6: ax dwi_adc · axial · 3.0mm · 0.65mm/px · z∈[-59,+89]mm · 3 of 45 slices shown]
[im 1/45]
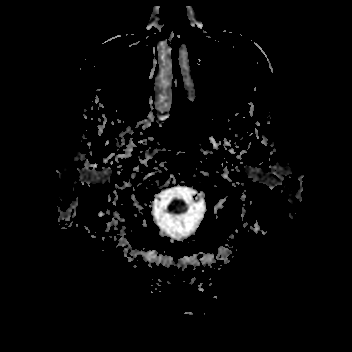
[im 23/45]
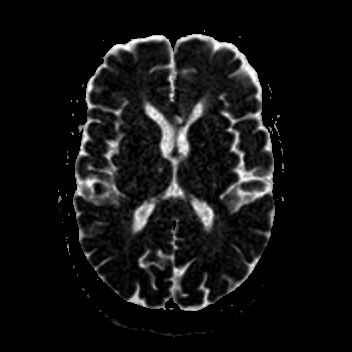
[im 45/45]
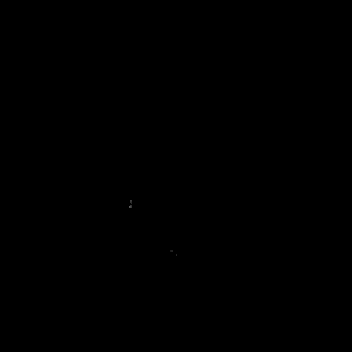

[Series 7: cor dwi_tracew · coronal · 5.0mm · 0.60mm/px · 3 of 38 slices shown]
[im 1/38]
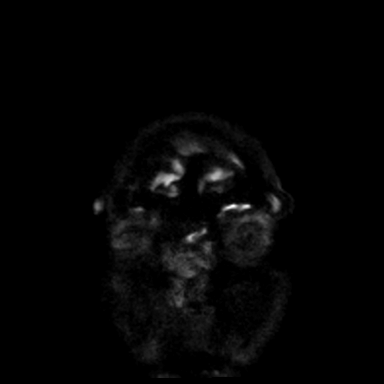
[im 19/38]
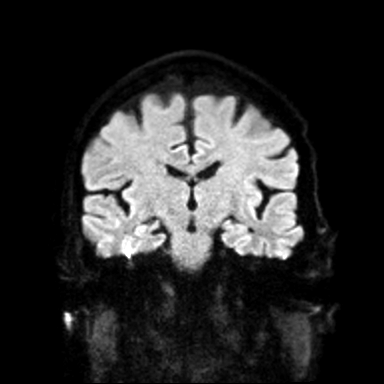
[im 38/38]
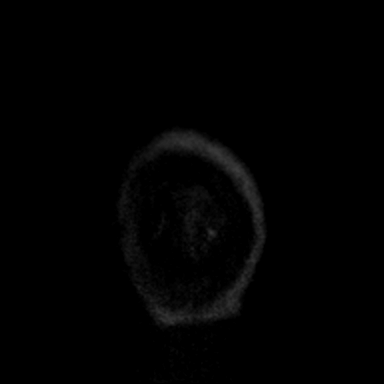

[Series 8: cor dwi_adc · coronal · 5.0mm · 0.60mm/px · 3 of 38 slices shown]
[im 1/38]
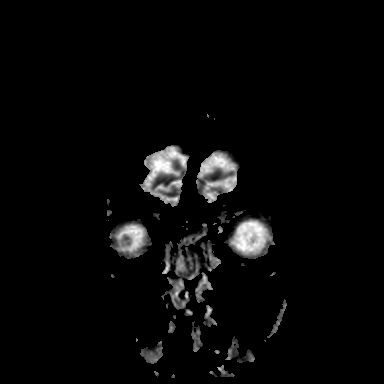
[im 19/38]
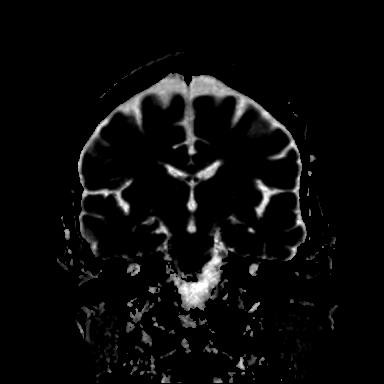
[im 38/38]
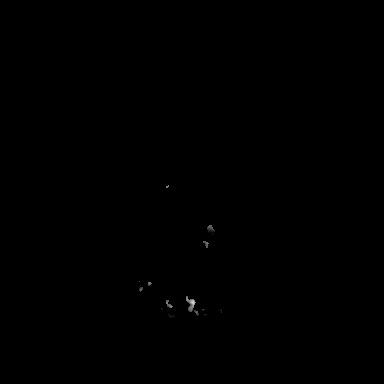

[Series 9: T1 · sagittal · 5.0mm · 0.62mm/px · 2 of 22 slices shown (1 of 2)]
[im 1/22]
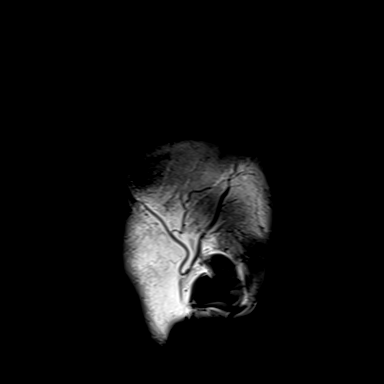
[im 22/22]
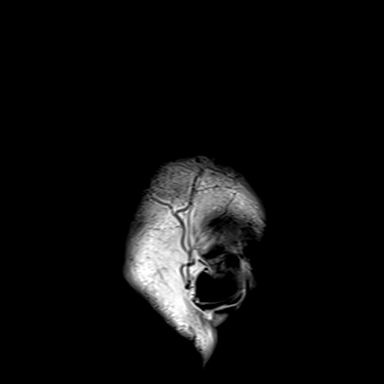

[Series 10: T2 · axial · 5.0mm · 0.53mm/px · z∈[-57,+87]mm · 2 of 25 slices shown (1 of 2)]
[im 1/25]
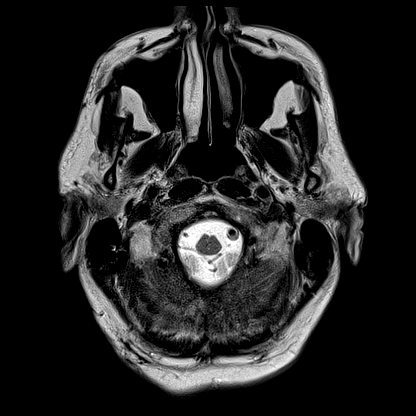
[im 25/25]
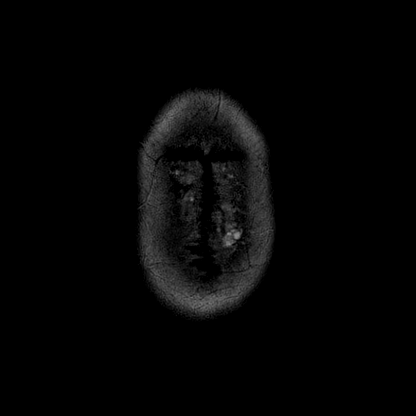

[Series 11: mag_images · axial · 3.0mm · 0.90mm/px · z∈[-67,+98]mm · 4 of 56 slices shown]
[im 1/56]
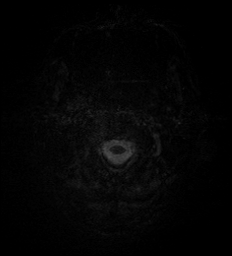
[im 19/56]
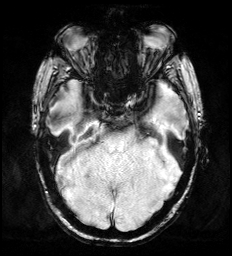
[im 37/56]
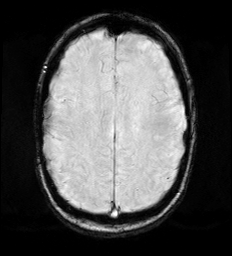
[im 56/56]
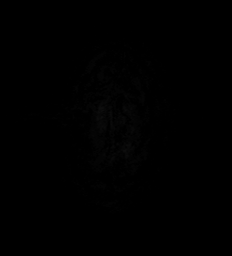

[Series 12: pha_images · axial · 3.0mm · 0.90mm/px · z∈[-67,+98]mm · 4 of 56 slices shown]
[im 1/56]
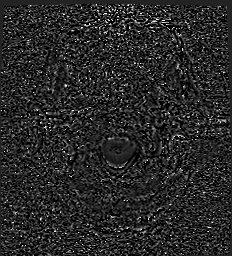
[im 19/56]
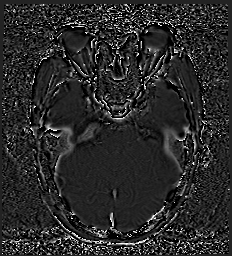
[im 37/56]
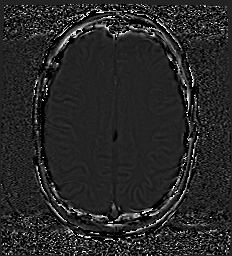
[im 56/56]
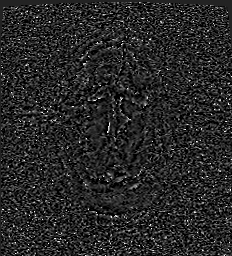

[Series 13: swi_images · axial · 3.0mm · 0.90mm/px · z∈[-67,+98]mm · 4 of 56 slices shown]
[im 1/56]
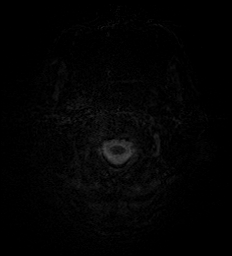
[im 19/56]
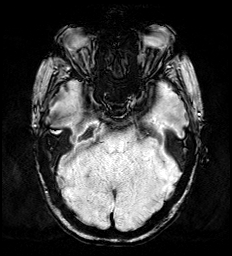
[im 37/56]
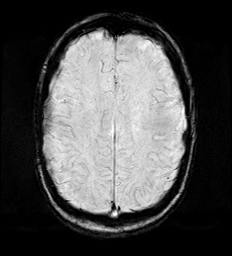
[im 56/56]
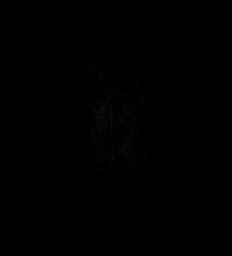

[Series 15: FLAIR · axial · 3.0mm · 0.53mm/px · z∈[-61,+91]mm · 4 of 52 slices shown]
[im 1/52]
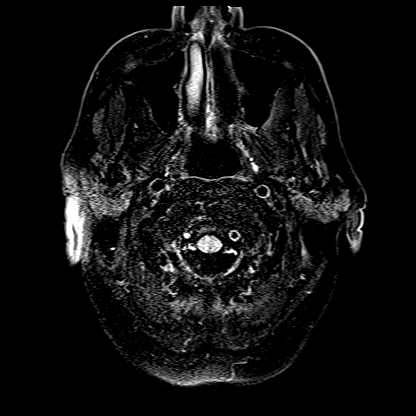
[im 18/52]
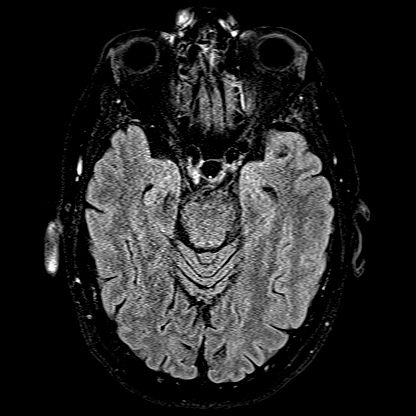
[im 35/52]
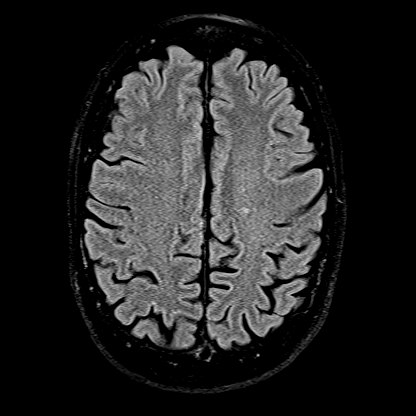
[im 52/52]
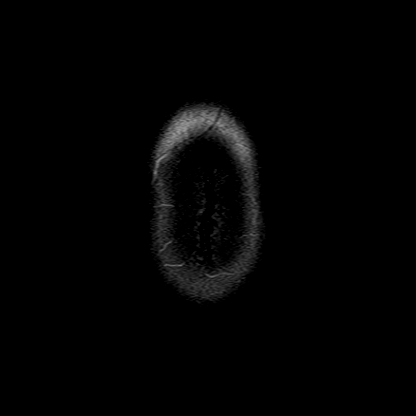

[Series 16: T1 · axial · 1.0mm · 0.98mm/px · z∈[-65,+94]mm · 11 of 160 slices shown (2 of 2)]
[im 1/160]
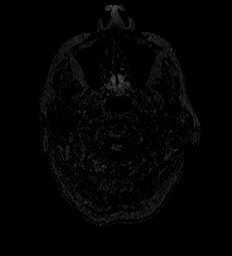
[im 15/160]
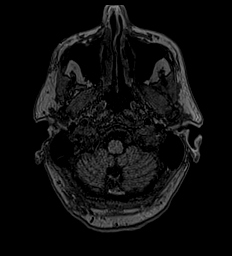
[im 29/160]
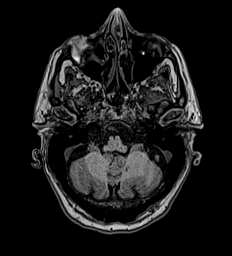
[im 44/160]
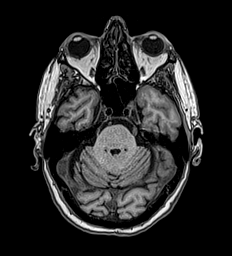
[im 58/160]
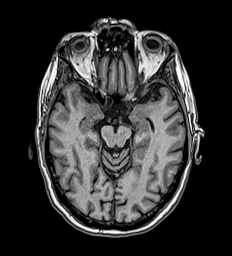
[im 73/160]
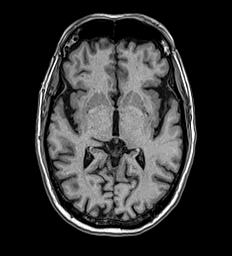
[im 87/160]
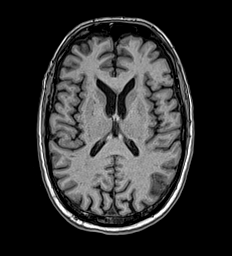
[im 102/160]
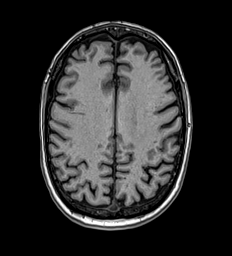
[im 116/160]
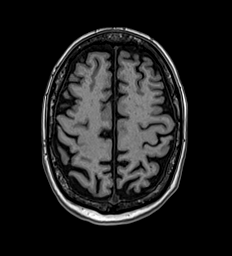
[im 131/160]
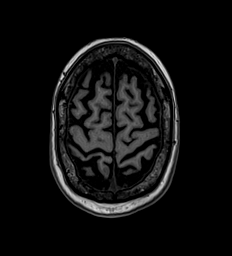
[im 160/160]
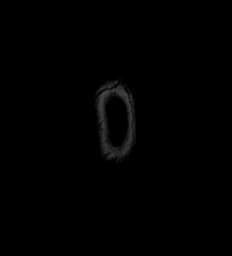

[Series 17: T2 · coronal · 5.0mm · 0.62mm/px · 2 of 29 slices shown (2 of 2)]
[im 1/29]
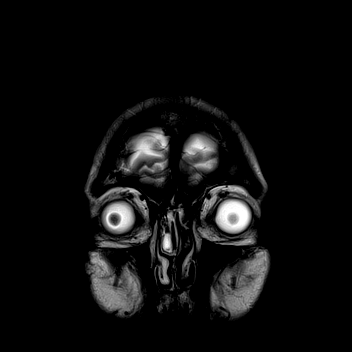
[im 29/29]
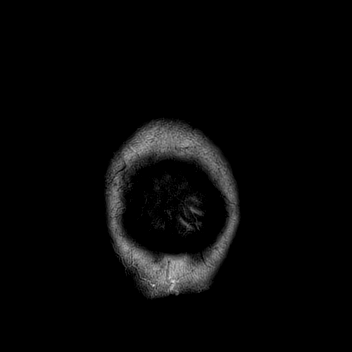

[47 of 48 positions shown; findings below may reference images not displayed]

FINDINGS: Brain: No acute infarct, mass effect or extra-axial collection. No
acute or chronic hemorrhage. There is multifocal hyperintense
T2-weighted signal within the white matter. Parenchymal volume and
CSF spaces are normal. Old bilateral cerebellar small vessel
infarcts. The midline structures are normal.

Vascular: Major flow voids are preserved.

Skull and upper cervical spine: Normal calvarium and skull base.
Visualized upper cervical spine and soft tissues are normal.

Sinuses/Orbits:No paranasal sinus fluid levels or advanced mucosal
thickening. No mastoid or middle ear effusion. Normal orbits.
IMPRESSION: 1. No acute intracranial abnormality.
2. Findings of chronic ischemic microangiopathy and old bilateral
cerebellar small vessel infarcts.

## 2021-10-05 IMAGING — CT CT HEAD W/O CM
3 series · 16 of 47 positions shown, 19 images · non-contrast
Comparison: None.

CLINICAL DATA: Increasing weakness since [REDACTED]

EXAM:
CT HEAD WITHOUT CONTRAST
TECHNIQUE: Contiguous axial images were obtained from the base of the skull
through the vertex without intravenous contrast.

[Series 3: head wo · axial · 0.48mm/px · z∈[-94,+41]mm · 10 of 33 slices shown, 13 images]
[im 3/33  brain]
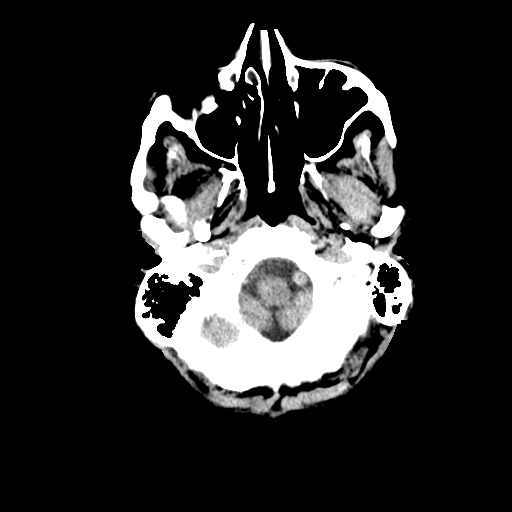
[im 3/33  bone]
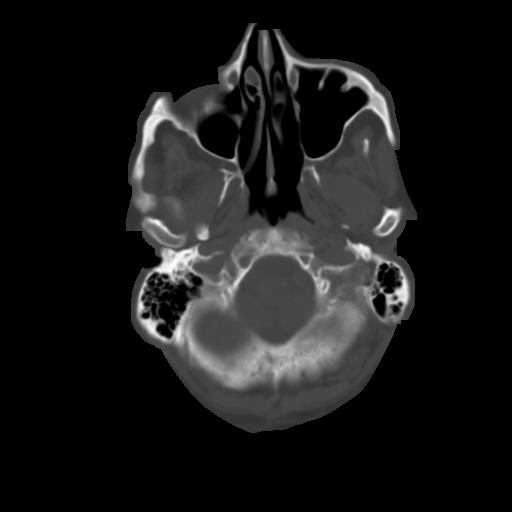
[im 6/33  brain]
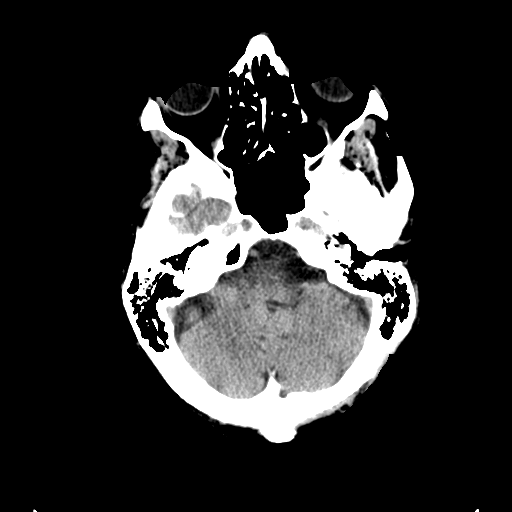
[im 9/33  brain]
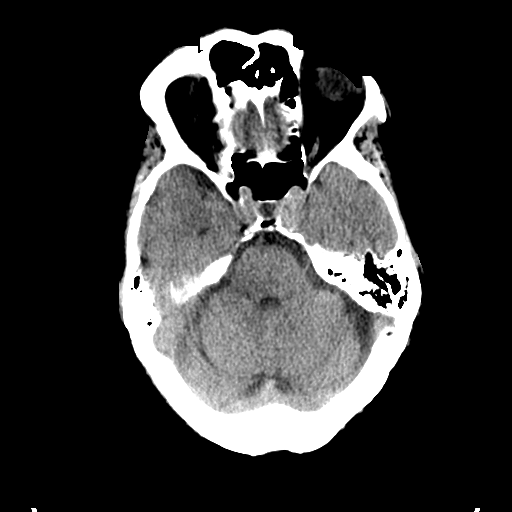
[im 12/33  brain]
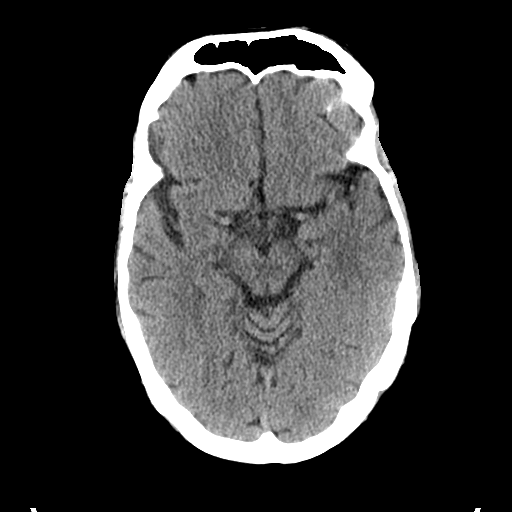
[im 15/33  brain]
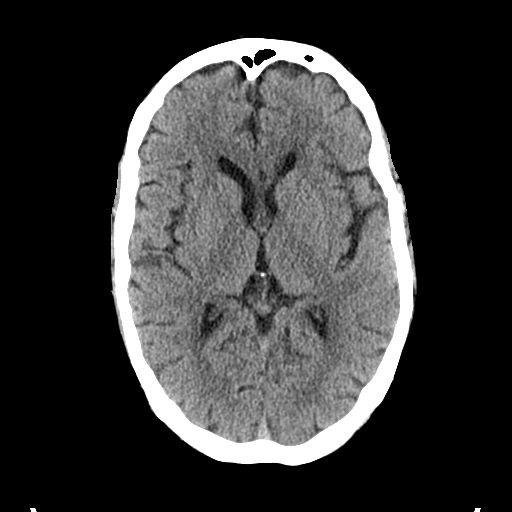
[im 15/33  bone]
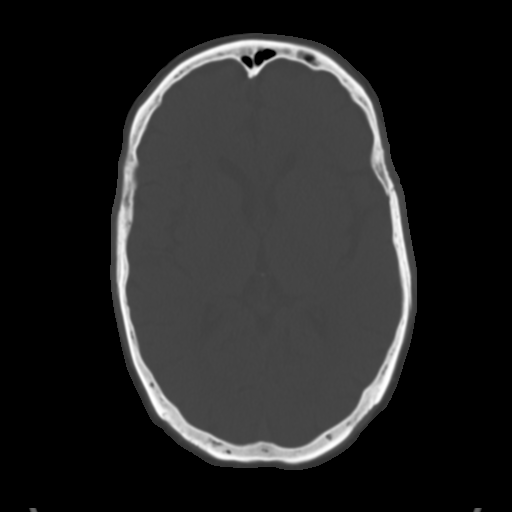
[im 18/33  brain]
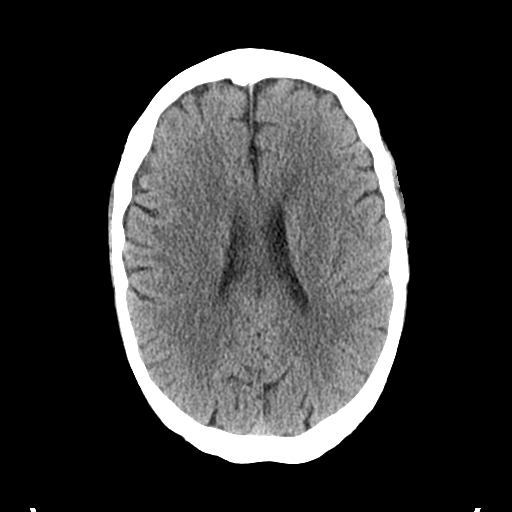
[im 21/33  brain]
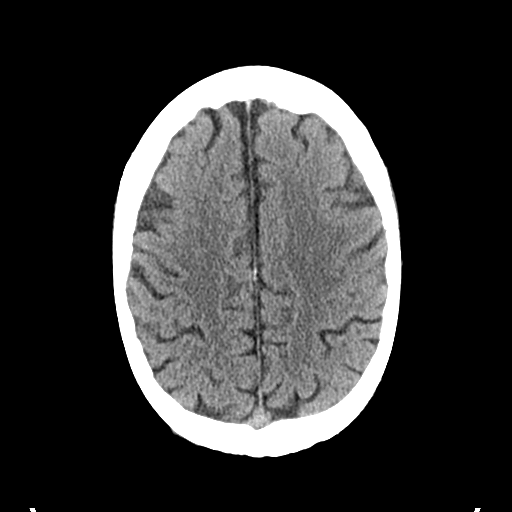
[im 25/33  brain]
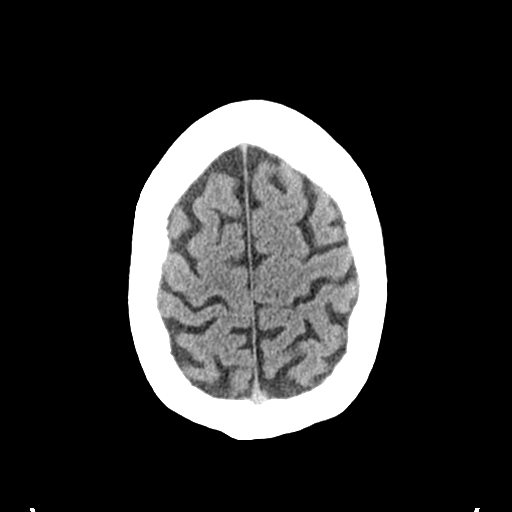
[im 27/33  brain]
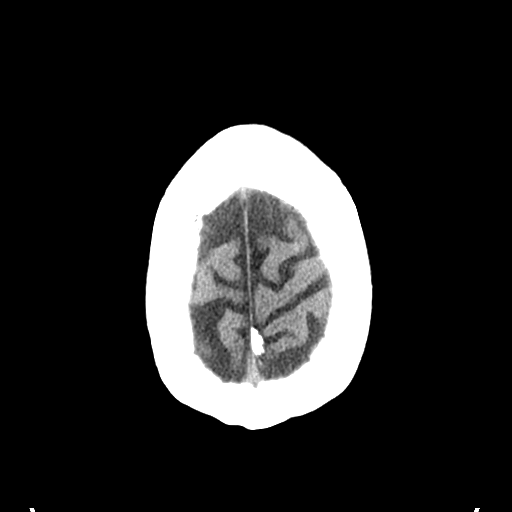
[im 27/33  bone]
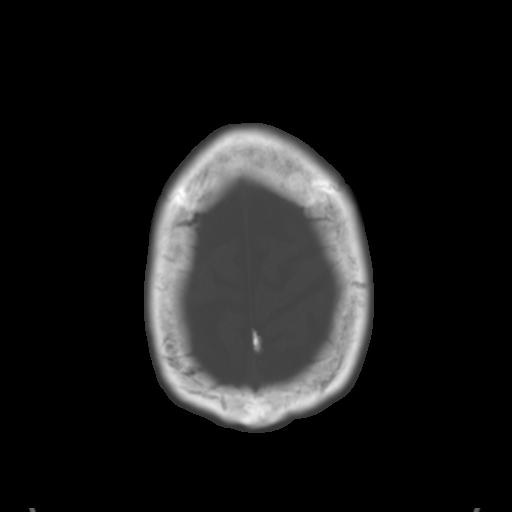
[im 30/33  brain]
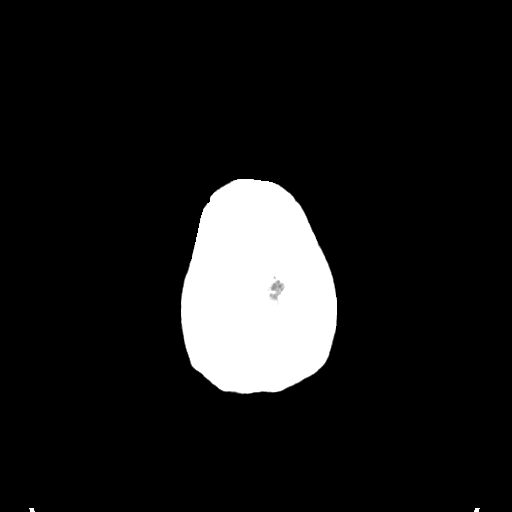

[Series 4: coronal soft tissue · coronal · 0.34mm/px · 3 of 73 slices shown]
[im 25/73  brain]
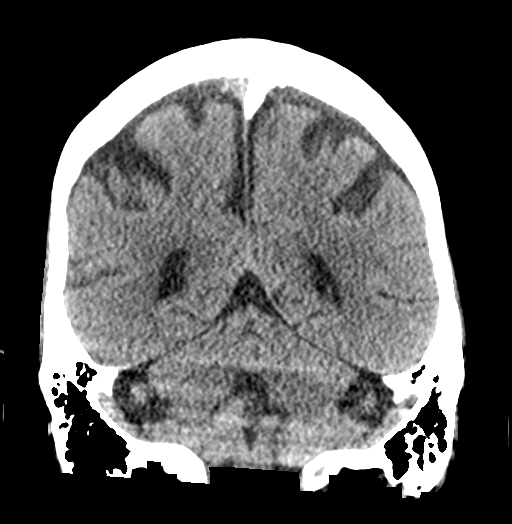
[im 33/73  brain]
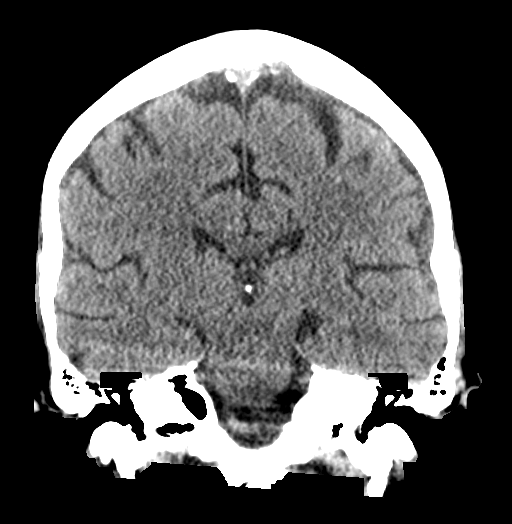
[im 41/73  brain]
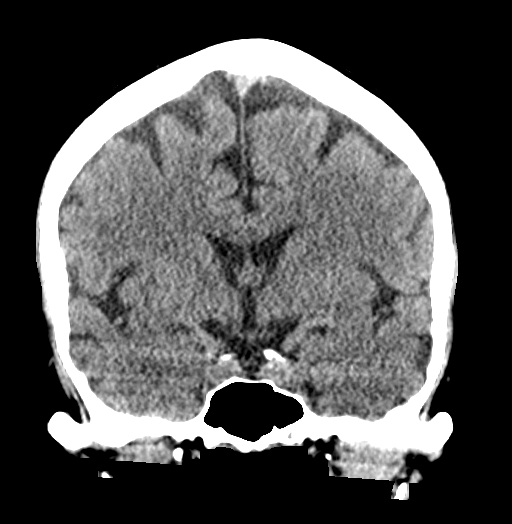

[Series 5: sagittal soft tissue · sagittal · 0.35mm/px · 3 of 59 slices shown]
[im 20/59  brain]
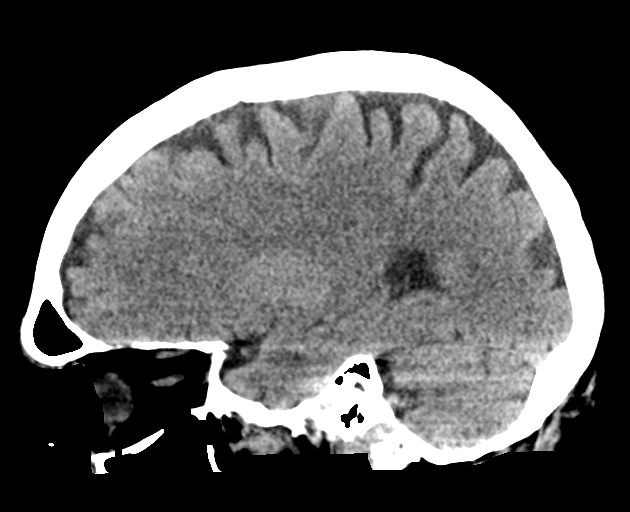
[im 30/59  brain]
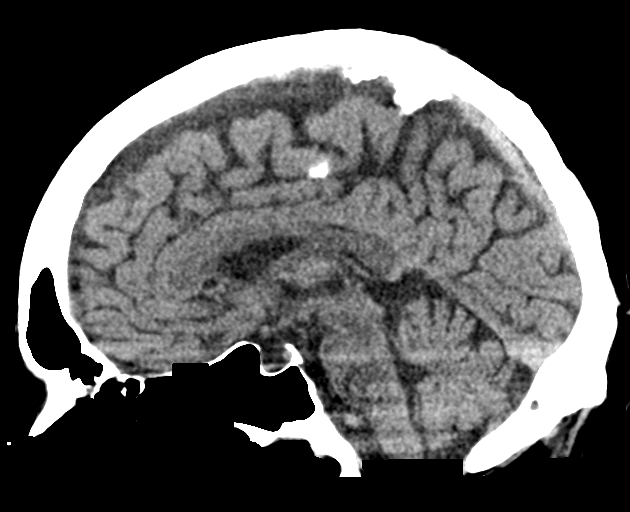
[im 39/59  brain]
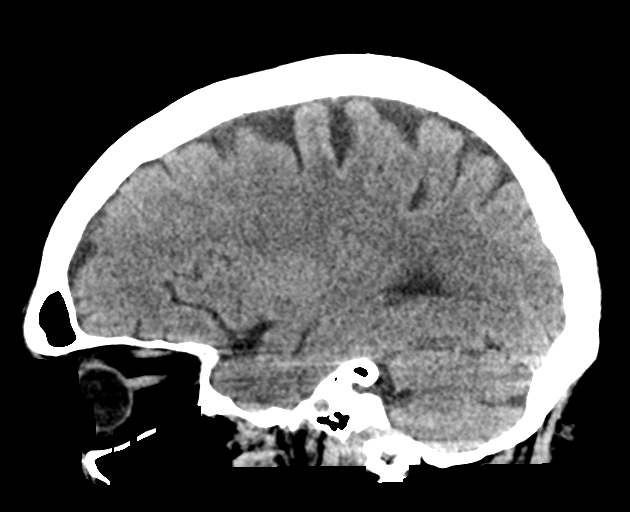

[16 of 47 positions shown; findings below may reference images not displayed]

FINDINGS: Brain: No evidence of acute infarction, hemorrhage, hydrocephalus,
extra-axial collection or mass lesion/mass effect.

Vascular: No hyperdense vessel or unexpected calcification.

Skull: No osseous abnormality.

Sinuses/Orbits: Visualized paranasal sinuses are clear. Visualized
mastoid sinuses are clear. Visualized orbits demonstrate no focal
abnormality.

Other: None
IMPRESSION: No acute intracranial pathology.

## 2021-10-05 MED ORDER — PNEUMOCOCCAL VAC POLYVALENT 25 MCG/0.5ML IJ INJ
0.5000 mL | INJECTION | INTRAMUSCULAR | Status: AC
  Administered 2021-10-06: 0.5 mL via INTRAMUSCULAR
  Filled 2021-10-05: qty 0.5

## 2021-10-05 MED ORDER — DEXTROSE IN LACTATED RINGERS 5 % IV SOLN: INTRAVENOUS | Status: DC

## 2021-10-05 MED ORDER — LACTATED RINGERS IV BOLUS
20.0000 mL/kg | Freq: Once | INTRAVENOUS | Status: AC
  Administered 2021-10-05: 1678 mL via INTRAVENOUS

## 2021-10-05 MED ORDER — VANCOMYCIN HCL IN DEXTROSE 1-5 GM/200ML-% IV SOLN
1000.0000 mg | Freq: Once | INTRAVENOUS | Status: AC
  Administered 2021-10-05: 1000 mg via INTRAVENOUS
  Filled 2021-10-05: qty 200

## 2021-10-05 MED ORDER — MORPHINE SULFATE (PF) 2 MG/ML IV SOLN: 1.0000 mg | INTRAVENOUS | Status: DC | PRN

## 2021-10-05 MED ORDER — CITALOPRAM HYDROBROMIDE 20 MG PO TABS
40.0000 mg | ORAL_TABLET | Freq: Every day | ORAL | Status: DC
  Administered 2021-10-05 – 2021-10-11 (×7): 40 mg via ORAL
  Filled 2021-10-05 (×8): qty 2

## 2021-10-05 MED ORDER — POTASSIUM CHLORIDE 10 MEQ/100ML IV SOLN
10.0000 meq | INTRAVENOUS | Status: AC
  Administered 2021-10-05 (×2): 10 meq via INTRAVENOUS
  Filled 2021-10-05 (×4): qty 100

## 2021-10-05 MED ORDER — IBRUTINIB 420 MG PO TABS: 1.0000 | ORAL_TABLET | Freq: Every day | ORAL | Status: DC

## 2021-10-05 MED ORDER — VANCOMYCIN HCL 1250 MG/250ML IV SOLN
1250.0000 mg | INTRAVENOUS | Status: DC
  Filled 2021-10-05: qty 250

## 2021-10-05 MED ORDER — CHLORHEXIDINE GLUCONATE CLOTH 2 % EX PADS
6.0000 | MEDICATED_PAD | Freq: Every day | CUTANEOUS | Status: DC
  Administered 2021-10-07 – 2021-10-11 (×4): 6 via TOPICAL

## 2021-10-05 MED ORDER — HEPARIN SODIUM (PORCINE) 5000 UNIT/ML IJ SOLN
5000.0000 [IU] | Freq: Three times a day (TID) | INTRAMUSCULAR | Status: DC
  Administered 2021-10-05 – 2021-10-07 (×6): 5000 [IU] via SUBCUTANEOUS
  Filled 2021-10-05 (×6): qty 1

## 2021-10-05 MED ORDER — IBRUTINIB 420 MG PO TABS
420.0000 mg | ORAL_TABLET | Freq: Every day | ORAL | Status: DC
  Administered 2021-10-06 – 2021-10-11 (×6): 420 mg via ORAL
  Filled 2021-10-05 (×8): qty 1

## 2021-10-05 MED ORDER — METRONIDAZOLE 500 MG/100ML IV SOLN
500.0000 mg | Freq: Once | INTRAVENOUS | Status: AC
  Administered 2021-10-05: 500 mg via INTRAVENOUS
  Filled 2021-10-05: qty 100

## 2021-10-05 MED ORDER — SODIUM CHLORIDE 0.9 % IV BOLUS (SEPSIS)
1000.0000 mL | Freq: Once | INTRAVENOUS | Status: AC
  Administered 2021-10-05: 1000 mL via INTRAVENOUS

## 2021-10-05 MED ORDER — METRONIDAZOLE 500 MG/100ML IV SOLN
500.0000 mg | Freq: Two times a day (BID) | INTRAVENOUS | Status: DC
  Administered 2021-10-05: 500 mg via INTRAVENOUS
  Filled 2021-10-05 (×2): qty 100

## 2021-10-05 MED ORDER — IBRUTINIB 420 MG PO TABS
1.0000 | ORAL_TABLET | Freq: Every day | ORAL | Status: DC
  Filled 2021-10-05 (×2): qty 1

## 2021-10-05 MED ORDER — MUPIROCIN 2 % EX OINT
1.0000 "application " | TOPICAL_OINTMENT | Freq: Two times a day (BID) | CUTANEOUS | Status: AC
  Administered 2021-10-05 – 2021-10-10 (×7): 1 via NASAL
  Filled 2021-10-05 (×2): qty 22

## 2021-10-05 MED ORDER — MELATONIN 5 MG PO TABS
5.0000 mg | ORAL_TABLET | Freq: Every evening | ORAL | Status: DC | PRN
  Administered 2021-10-07 – 2021-10-09 (×3): 5 mg via ORAL
  Filled 2021-10-05 (×4): qty 1

## 2021-10-05 MED ORDER — SODIUM CHLORIDE 0.9 % IV BOLUS
1000.0000 mL | Freq: Once | INTRAVENOUS | Status: AC
  Administered 2021-10-05: 1000 mL via INTRAVENOUS

## 2021-10-05 MED ORDER — CHLORHEXIDINE GLUCONATE CLOTH 2 % EX PADS
6.0000 | MEDICATED_PAD | Freq: Every day | CUTANEOUS | Status: AC
  Administered 2021-10-06 – 2021-10-10 (×5): 6 via TOPICAL

## 2021-10-05 MED ORDER — LACTATED RINGERS IV SOLN: INTRAVENOUS | Status: DC

## 2021-10-05 MED ORDER — LACTATED RINGERS IV SOLN: INTRAVENOUS | Status: AC

## 2021-10-05 MED ORDER — SODIUM CHLORIDE 0.9 % IV SOLN
2.0000 g | Freq: Two times a day (BID) | INTRAVENOUS | Status: DC
  Administered 2021-10-06 (×2): 2 g via INTRAVENOUS
  Filled 2021-10-05 (×4): qty 2

## 2021-10-05 MED ORDER — DEXTROSE 50 % IV SOLN: 0.0000 mL | INTRAVENOUS | Status: DC | PRN

## 2021-10-05 MED ORDER — INSULIN REGULAR(HUMAN) IN NACL 100-0.9 UT/100ML-% IV SOLN
INTRAVENOUS | Status: DC
  Administered 2021-10-05: 12 [IU]/h via INTRAVENOUS
  Administered 2021-10-05: 5.5 [IU]/h via INTRAVENOUS
  Filled 2021-10-05 (×2): qty 100

## 2021-10-05 MED ORDER — SODIUM CHLORIDE 0.9 % IV SOLN
2.0000 g | Freq: Once | INTRAVENOUS | Status: AC
  Administered 2021-10-05: 2 g via INTRAVENOUS
  Filled 2021-10-05: qty 2

## 2021-10-05 NOTE — Progress Notes (Signed)
Pharmacy Antibiotic Note  Travis [name]. is a 70 y.o. male admitted on 10/05/2021 with sepsis.  Pharmacy has been consulted for vancomycin and cefepime dosing.  Creatinine elevated 1.68 (BL 1.28).  Plan: Vancomycin 1,000 mg now to complete loading dose of 2,000 mg Vancomycin maintenance dose 1250 mg every 24 hours.  Estimated AUC 514.7 Cmax 33.7 Cmin 13.4 Goal AUC 400-550  Cefepime 2 grams every 12 hours.  F/u Scr in the morning prior to next vancomycin dose.  Monitor renal function, clinical course, and length of therapy.  Height: 5\' 11"  (180.3 cm) Weight: 82.1 kg (181 lb) IBW/kg (Calculated) : 75.3  Temp (24hrs), Avg:98.2 F (36.8 C), Min:97.5 F (36.4 C), Max:98.6 F (37 C)  Recent Labs  Lab 10/05/21 1130 10/05/21 1252  WBC 38.9* 32.7*  CREATININE 1.68*  --   LATICACIDVEN  --  2.9*    Estimated Creatinine Clearance: 44.2 mL/min (A) (by C-G formula based on SCr of 1.68 mg/dL (H)).    No Known Allergies  Antimicrobials this admission: Vanc 10/9 >>  Cefepime 10/9 >>   Dose adjustments this admission: None  Microbiology results: 10/9 BCx: in process 10/9 UCx: in process    Thank you for allowing pharmacy to be a part of this patient's care.   Wynelle Cleveland, PharmD Pharmacy Resident  10/05/2021 4:34 PM

## 2021-10-05 NOTE — Sepsis Progress Note (Signed)
Secure chat with bedside nurse regarding need for lab to draw the 2nd lactic acid.

## 2021-10-05 NOTE — Progress Notes (Signed)
PHARMACY -  BRIEF ANTIBIOTIC NOTE   Pharmacy has received consult(s) for vancomycin and cefepime from an ED provider.  The patient's profile has been reviewed for ht/wt/allergies/indication/available labs.    One time order(s) placed for vancomycin 1 g + cefepime 2 g  Further antibiotics/pharmacy consults should be ordered by admitting physician if indicated.                       Thank you,  Tawnya Crook, PharmD, BCPS Clinical Pharmacist 10/05/2021 2:20 PM

## 2021-10-05 NOTE — Progress Notes (Signed)
CODE SEPSIS - PHARMACY COMMUNICATION  **Broad Spectrum Antibiotics should be administered within 1 hour of Sepsis diagnosis**  Time Code Sepsis Called/Page Received: 1423  Antibiotics Ordered: cefepime/vancomycin  Time of 1st antibiotic administration: [place], PharmD, BCPS Clinical Pharmacist 10/05/2021 2:35 PM

## 2021-10-05 NOTE — ED Provider Notes (Addendum)
[facility] Provider Note   ____________________________________________   Event Date/Time   First MD Initiated Contact with Patient 10/05/21 1235     (approximate)  I have reviewed the triage vital signs and the nursing notes.   HISTORY  Chief Complaint Weakness    HPI Travis Buhl. is a 70 y.o. male with a history of CLL who has been getting increasingly weak since Friday.  He was prescribed glipizide on Friday but CBG was reading higher than 400 this morning.  He is also been taking his metformin.  He reports constant nausea and cannot keep anything down.  He reports the last several days his speech has been somewhat slurry other than usual.  Not quite normal.  He may have some word finding difficulty as well.  He has a previous diagnosis of type 2 diabetes.  He is not having any abdominal pain or chest pain he is not coughing he has not had a fever.        Past Medical History:  Diagnosis Date   Diabetes mellitus without complication ([place])    Leukemia ([place])     Patient Active Problem List   Diagnosis Date Noted   Hyperglycemia 10/05/2021   Onychomycosis of multiple toenails with type 2 diabetes mellitus ([place]) 07/11/2018   Diabetes mellitus type 2, uncomplicated ([place]) 58/08/9832   Neuropathy associated with cancer ([place]) 05/30/2018   Vitamin D deficiency 04/07/2018   Varicose veins of bilateral lower extremities with pain 03/16/2018   Diabetes ([place]) 03/16/2018   CLL (chronic lymphocytic leukemia) ([place]) 04/02/2017    Past Surgical History:  Procedure Laterality Date   COLONOSCOPY     ESOPHAGOGASTRODUODENOSCOPY (EGD) WITH PROPOFOL N/A 03/14/2018   Procedure: ESOPHAGOGASTRODUODENOSCOPY (EGD) WITH PROPOFOL;  Surgeon: [name], MD;  Location[facility] ENDOSCOPY;  Service: Gastroenterology;  Laterality: N/A;   ESOPHAGOGASTRODUODENOSCOPY (EGD) WITH PROPOFOL N/A 06/13/2018   Procedure: ESOPHAGOGASTRODUODENOSCOPY (EGD) WITH  PROPOFOL;  Surgeon: [name], MD;  Location: [facility] ENDOSCOPY;  Service: Gastroenterology;  Laterality: N/A;   WISDOM TOOTH EXTRACTION      Prior to Admission medications   Medication Sig Start Date End Date Taking? Authorizing Provider  acetaminophen (TYLENOL) 500 MG tablet Take 500-1,000 mg by mouth every 6 (six) hours as needed for mild pain.   Yes [provider]  Cholecalciferol (VITAMIN D3) 1000 units CAPS Take 1,000 Int'l Units by mouth daily.   Yes [provider]  citalopram (CELEXA) 20 MG tablet Take 40 mg by mouth daily.   Yes [provider]  glipiZIDE (GLUCOTROL XL) 2.5 MG 24 hr tablet Take 2.5 mg by mouth daily.   Yes [provider]  ibrutinib (IMBRUVICA) 420 MG TABS TAKE 1 TABLET BY MOUTH DAILY. 07/10/21 07/10/22 Yes Lloyd Huger, MD  metFORMIN (GLUCOPHAGE) 1000 MG tablet Take 1,000 mg by mouth 2 (two) times daily with a meal.   Yes [provider]    Allergies Patient has no known allergies.  Family History  Problem Relation Age of Onset   Leukemia Mother    Colon cancer Father    Diabetes Father    Heart disease Father    Diabetes Brother     Social History Social History   Tobacco Use   Smoking status: Never   Smokeless tobacco: Never  Vaping Use   Vaping Use: Never used  Substance Use Topics   Alcohol use: Yes    Comment: rarely   Drug use: No    Review  of Systems  Constitutional: No fever/chills Eyes: No visual changes. ENT: No sore throat. Cardiovascular: Denies chest pain. Respiratory: Denies shortness of breath. Gastrointestinal: No abdominal pain.   nausea, vomiting.  No diarrhea.  No constipation. Genitourinary: Negative for dysuria. Musculoskeletal: Negative for back pain. Skin: Negative for rash. Neurological: Negative for headaches, focal weakness he is diffusely weak.  ____________________________________________   PHYSICAL EXAM:  VITAL SIGNS: ED Triage Vitals  Enc Vitals Group      BP 10/05/21 1129 108/90     Pulse Rate 10/05/21 1129 (!) 106     Resp 10/05/21 1129 (!) 26     Temp 10/05/21 1129 98.6 F (37 C)     Temp Source 10/05/21 1129 Oral     SpO2 10/05/21 1129 96 %     Weight 10/05/21 1127 185 lb (83.9 kg)     Height 10/05/21 1127 5\' 11"  (1.803 m)     Head Circumference --      Peak Flow --      Pain Score 10/05/21 1127 8     Pain Loc --      Pain Edu? --      Excl. in [place]? --     Constitutional: Alert and oriented.  Looks sick and weak.  Patient smells ketotic. Eyes: Conjunctivae are normal. PERRL. EOMI. Head: Atraumatic. Nose: No congestion/rhinnorhea. Mouth/Throat: Mucous membranes are dry appearing.  Oropharynx non-erythematous. Neck: No stridor.   Cardiovascular: Normal rate, regular rhythm. Grossly normal heart sounds.  Good peripheral circulation. Respiratory: Normal respiratory effort.  No retractions. Lungs CTAB. Gastrointestinal: Soft and nontender. No distention. No abdominal bruits. No CVA tenderness. Musculoskeletal: No lower extremity tenderness nor edema.   Neurologic:  Normal speech and language. No gross focal neurologic deficits are appreciated.  Skin:  Skin is warm, dry and intact. No rash noted.   ____________________________________________   LABS (all labs ordered are listed, but only abnormal results are displayed)  Labs Reviewed  BASIC METABOLIC PANEL - Abnormal; Notable for the following components:      Result Value   Sodium 128 (*)    Chloride 96 (*)    CO2 12 (*)    Glucose, Bld 511 (*)    BUN 42 (*)    Creatinine, Ser 1.68 (*)    GFR, Estimated 44 (*)    Anion gap 20 (*)    All other components within normal limits  CBC - Abnormal; Notable for the following components:   WBC 38.9 (*)    Platelets 416 (*)    All other components within normal limits  URINALYSIS, COMPLETE (UACMP) WITH MICROSCOPIC - Abnormal; Notable for the following components:   Color, Urine YELLOW (*)    APPearance HAZY (*)     Glucose, UA >=500 (*)    Hgb urine dipstick MODERATE (*)    Ketones, ur 80 (*)    Protein, ur 100 (*)    Leukocytes,Ua SMALL (*)    WBC, UA >50 (*)    Bacteria, UA RARE (*)    All other components within normal limits  LACTIC ACID, PLASMA - Abnormal; Notable for the following components:   Lactic Acid, Venous 2.9 (*)    All other components within normal limits  HEPATIC FUNCTION PANEL - Abnormal; Notable for the following components:   Total Protein 6.2 (*)    Albumin 2.5 (*)    Alkaline Phosphatase 137 (*)    Total Bilirubin 1.5 (*)    Indirect Bilirubin 1.3 (*)    All  other components within normal limits  BLOOD GAS, VENOUS - Abnormal; Notable for the following components:   pH, Ven 7.14 (*)    pCO2, Ven 25 (*)    Bicarbonate 8.5 (*)    Acid-base deficit 18.8 (*)    All other components within normal limits  CBC WITH DIFFERENTIAL/PLATELET - Abnormal; Notable for the following components:   WBC 32.7 (*)    Neutro Abs 27.5 (*)    Monocytes Absolute 2.0 (*)    Abs Immature Granulocytes 2.11 (*)    All other components within normal limits  PHOSPHORUS - Abnormal; Notable for the following components:   Phosphorus 5.3 (*)    All other components within normal limits  URIC ACID - Abnormal; Notable for the following components:   Uric Acid, Serum 10.0 (*)    All other components within normal limits  GLUCOSE, CAPILLARY - Abnormal; Notable for the following components:   Glucose-Capillary 456 (*)    All other components within normal limits  CBG MONITORING, ED - Abnormal; Notable for the following components:   Glucose-Capillary 469 (*)    All other components within normal limits  CBG MONITORING, ED - Abnormal; Notable for the following components:   Glucose-Capillary 440 (*)    All other components within normal limits  CBG MONITORING, ED - Abnormal; Notable for the following components:   Glucose-Capillary 428 (*)    All other components within normal limits  URINE  CULTURE  CULTURE, BLOOD (ROUTINE X 2)  CULTURE, BLOOD (ROUTINE X 2)  RESP PANEL BY RT-PCR (FLU A&B, COVID) ARPGX2  MRSA NEXT GEN BY PCR, NASAL  DIFFERENTIAL  LACTIC ACID, PLASMA  CBC WITH DIFFERENTIAL/PLATELET  PROCALCITONIN  HIV ANTIBODY (ROUTINE TESTING W REFLEX)  BASIC METABOLIC PANEL  BASIC METABOLIC PANEL  BASIC METABOLIC PANEL  BASIC METABOLIC PANEL  BETA-HYDROXYBUTYRIC ACID  BETA-HYDROXYBUTYRIC ACID  HEMOGLOBIN A1C   ____________________________________________  EKG  EKG read interpreted by me shows sinus tachycardia at a rate of 110 ____________________________________________  RADIOLOGY Gertha Calkin, personally viewed and evaluated these images (plain radiographs) as part of my medical decision making, as well as reviewing the written report by the radiologist.  ED MD interpretation: CT of head and chest read by radiology reviewed by me show nothing suffered 2 small opacities on the chest x-ray.  I will wait to follow up with them until patient is more stable.  Official radiology report(s): CT Head Wo Contrast  Result Date: 10/05/2021 CLINICAL DATA:  Increasing weakness since Friday EXAM: CT HEAD WITHOUT CONTRAST TECHNIQUE: Contiguous axial images were obtained from the base of the skull through the vertex without intravenous contrast. COMPARISON:  None. FINDINGS: Brain: No evidence of acute infarction, hemorrhage, hydrocephalus, extra-axial collection or mass lesion/mass effect. Vascular: No hyperdense vessel or unexpected calcification. Skull: No osseous abnormality. Sinuses/Orbits: Visualized paranasal sinuses are clear. Visualized mastoid sinuses are clear. Visualized orbits demonstrate no focal abnormality. Other: None IMPRESSION: No acute intracranial pathology. Electronically Signed   By: Kathreen Devoid M.D.   On: 10/05/2021 13:48   DG Chest Portable 1 View  Result Date: 10/05/2021 CLINICAL DATA:  Weakness EXAM: PORTABLE CHEST 1 VIEW COMPARISON:  None.  FINDINGS: Normal heart size. Normal mediastinal contour. No pneumothorax. No pleural effusion. Two small indistinct nodular opacities in the right lung measuring 7 mm in the upper right lung and 8 mm in the lower right lung. Clear left lung. IMPRESSION: Two small indistinct nodular opacities in the right lung. Chest CT with IV  contrast recommended to evaluate for pulmonary nodules. Electronically Signed   By: Ilona Sorrel M.D.   On: 10/05/2021 13:27    ____________________________________________   PROCEDURES  Procedure(s) performed (including Critical Care): Critical care time half an hour.  This includes reviewing the patient's old records and old lab work talking to his family member and he self and with the hospitalist.  Procedures   ____________________________________________   INITIAL IMPRESSION / ASSESSMENT AND PLAN / ED COURSE  ----------------------------------------- 3:47 PM on 10/05/2021 ----------------------------------------- Discussed the patient with hospitalist Dr. Tobie Poet.  She will begin insulin therapy we will continue fluids.  Patient initially presented as though he may have been having a blast crisis but the differential preliminary report showed only polys with some immature forms and no blast.  This leads me to suspect is more likely to be sepsis.  We will begin treating his acidosis and high blood glucose.  He should not have DKA his type 2 diabetes although theoretically it is possible.  Additionally we will begin treating for sepsis.  We will further investigate for source.  He will need the CT of his chest to evaluate due to fuzzy densities seen on the chest x-ray.  Patient does meet sepsis criteria.              ____________________________________________   FINAL CLINICAL IMPRESSION(S) / ED DIAGNOSES  Final diagnoses:  Weakness  Acidosis  Hyperglycemia  Leukocytosis, unspecified type  Urinary tract infection with hematuria, site unspecified   Sepsis, due to unspecified organism, unspecified whether acute organ dysfunction present [facility])     ED Discharge Orders     None        Note:  This document was prepared using Dragon voice recognition software and may include unintentional dictation errors.    Nena Polio, MD 10/05/21 1550    Nena Polio, MD 10/05/21 [phone]

## 2021-10-05 NOTE — Sepsis Progress Note (Signed)
Sepsis protocol is being monitored by [employer]. 

## 2021-10-05 NOTE — ED Triage Notes (Addendum)
Pt to ER via POV w/ daughter with reports of increasing weakness since Friday. Pt contacted pcp on Friday and was prescribed glipizide XR but cbg was reading higher than 400 this morning. Has also been taking other prescribed diabetes meds (metformin). Reports constant nausea and being unable to keep anything down PO. Pt w/ difficulty ambulating and caring for himself due to weakness.

## 2021-10-05 NOTE — H&P (Addendum)
Addendum  Reevaluated patient at bedside - Patient states he is feeling much better and his spouse at bedside states that his color is much better -Patient appears more awake and less lethargic at this time  # Right-sided pulmonary nodules on chest x-ray - Discussed extensively with patient and spouse at bedside that patient will need to follow-up with this outpatient - Patient would benefit from a CT of the chest with contrast to assess for possible malignancy - Given that patient has acute kidney injury, CT of the chest with contrast was considered however deferred at this time due to acute kidney injury - Patient and spouse at bedside endorses understanding and compliance     History and Physical   Travis Veto Kemps. XVQ:[id number] DOB: [birth year] DOA: 10/05/2021  PCP: Adin Hector, MD  Outpatient Specialists: Dr. Rosita Kea, urology; Dr. Grayland Ormond, oncology Patient coming from: home   I have personally briefly reviewed patient's old medical records in [place].  Chief Concern: lethargy, confusion, weakness  HPI: Travis Karstens. is a 70 y.o. male with medical history significant for CLL, non-insulin-dependent diabetes mellitus, depression/anxiety, who presents to the emergency department for chief concerns of weakness, tiredness, and shortness of breath.  He reports the weakness, fatigue, shortness of breath started about 3-4 days ago.  He endorses midback pain that started about 3-4 days ago. He endorses the pain is 9/10 and prevents him from sleeping.  He denies trauma to his person.    He endorses losing interest in watching TV.  He states he has never felt this way before.  He denies fever, cough, chest pain. He endorses abdominal pain with palpation.  He denies diarrhea.  He endorses generalized muscle aches.  Social history: He denies never using tobacco products. He occasioanlly drinks etoh, and last drinkwas labor day. He denies recreatioanl drug  use.  Vaccination history: He is vaccinated for covid 19, 3 doses of Pfizer.   ROS: Constitutional: no weight change, no fever ENT/Mouth: no sore throat, no rhinorrhea Eyes: no eye pain, no vision changes Cardiovascular: no chest pain, no dyspnea,  no edema, no palpitations Respiratory: no cough, no sputum, no wheezing Gastrointestinal: no nausea, no vomiting, no diarrhea, no constipation Genitourinary: + urinary incontinence, + dysuria, no hematuria Musculoskeletal: no arthralgias, no myalgias Skin: no skin lesions, no pruritus, Neuro: + weakness, no loss of consciousness, no syncope Psych: no anxiety, no depression, + decrease appetite Heme/Lymph: no bruising, no bleeding  ED Course: Discussed with emergency medicine provider, patient requiring hospitalization for multiple etiology including severe leukocytosis and hyperglycemia.  Vitals in the emergency department was remarkable for temperature 98.6, respiration rate of 26, initial heart rate of 106, blood pressure 108/90 and improved to 142/80, SPO2 of 96% on room air.  VBG in the emergency department showed pH of 7.13/CO2 of 25/O2 is pending.  Labs in the emergency department was remarkable for sodium 128, potassium 4.9, chloride 96, bicarb 12, BUN of 42, serum creatinine of 1.68, nonfasting blood glucose 511, GFR 44, WBC 32.7, hemoglobin 14.7, platelets 326.  Lactic acid 2.5.  ED provider ordered cefepime, vancomycin, LR 150 mL/h, sodium chloride 2 L bolus total.  Assessment/Plan  Principal Problem:   Severe sepsis with acute organ dysfunction (HCC) Active Problems:   CLL (chronic lymphocytic leukemia) (HCC)   Varicose veins of bilateral lower extremities with pain   Diabetes mellitus type 2, uncomplicated (HCC)   Neuropathy associated with cancer (HCC)   Vitamin D deficiency  Hyperglycemia   AKI (acute kidney injury) ([place])   Hyperosmolar hyponatremia   Nodule of right lung   # Lethargy and weakness-secondary to  presumed diabetic ketoacidosis # Metabolic encephalopathy secondary to DKA and sepsis # Anion gap metabolic acidosis secondary to DKA - Etiology work-up in progress at this time, however I suspect that this is secondary to sepsis with possible bacteremia - Blood cultures x2 and urine culture are in process - Insulin GTT initiated - Admit to stepdown, inpatient, telemetry  # Meet sepsis criteria with elevated lactic acid, elevated WBC, altered mental status, increased respiration rate, increased heart rate # Acute kidney injury-no prior CKD at baseline - Meets severe sepsis criteria with renal and endocrine organs involvement - Patient is status post sodium chloride 2 L bolus - Additional lactated ringer bolus ordered - Continue aggressive fluid hydration with lactated ringer - Check CK - Follow up with second lactic acid - Continue broad-spectrum antibiotic with vancomycin, cefepime, metronidazole IV - CBC in the a.m.  # Elevated WBC-does not meet criteria for tumor lysis syndrome - Initial WBC presentation is 38.9 and decreased to 32.7 - Elevated WBC presumed multifactorial including sepsis versus DKA - Uric acid, phosphorus were elevated - Serum calcium was not low, potassium was not markedly elevated - Continue IV fluid hydration as above  # Hyponatremia secondary to hyperosmolar hyperglycemia - Corrected sodium is 138, using the [place], 1999 correction - Treat as above  # CLL-resumed home ibrutinib 420 mg, 1 tablet daily - Pharmacy consult for medication relabeling if needed  # Lethargy, confusion, difficulty speaking-MRI of the brain has been ordered  # Patient has Foley catheter present that was placed on 09/28/2021 - Per ED note the Foley catheter was a 16 [place]  COVID PCR/influenza A/influenza B PCR were negative  Chart reviewed.   DVT prophylaxis: Heparin 5000 units every 8 hours subcutaneous Code Status: Full code Diet: N.p.o. Family Communication: Updated  spouse at bedside Disposition Plan: Pending clinical course Consults called: None at this time Admission status: Stepdown, inpatient, telemetry  Past Medical History:  Diagnosis Date   Diabetes mellitus without complication ([place])    Leukemia ([place])    Past Surgical History:  Procedure Laterality Date   COLONOSCOPY     ESOPHAGOGASTRODUODENOSCOPY (EGD) WITH PROPOFOL N/A 03/14/2018   Procedure: ESOPHAGOGASTRODUODENOSCOPY (EGD) WITH PROPOFOL;  Surgeon: [name], MD;  Location[facility] ENDOSCOPY;  Service: Gastroenterology;  Laterality: N/A;   ESOPHAGOGASTRODUODENOSCOPY (EGD) WITH PROPOFOL N/A 06/13/2018   Procedure: ESOPHAGOGASTRODUODENOSCOPY (EGD) WITH PROPOFOL;  Surgeon: [name], MD;  Location: [facility] ENDOSCOPY;  Service: Gastroenterology;  Laterality: N/A;   WISDOM TOOTH EXTRACTION     Social History:  reports that he has never smoked. He has never used smokeless tobacco. He reports current alcohol use. He reports that he does not use drugs.  No Known Allergies Family History  Problem Relation Age of Onset   Leukemia Mother    Colon cancer Father    Diabetes Father    Heart disease Father    Diabetes Brother    Family history: Family history reviewed and pertinent for leukemia in mother.  Prior to Admission medications   Medication Sig Start Date End Date Taking? Authorizing Provider  acetaminophen (TYLENOL) 500 MG tablet Take 2,000 mg by mouth as needed.     [provider]  Cholecalciferol (VITAMIN D3) 1000 units CAPS Take by mouth.    [provider]  citalopram (CELEXA) 20 MG tablet  02/11/21   [provider]  ibrutinib (IMBRUVICA) 420 MG TABS TAKE 1 TABLET BY MOUTH DAILY. 07/10/21 07/10/22  Lloyd Huger, MD   Physical Exam: Vitals:   10/05/21 1552 10/05/21 1600 10/05/21 1700 10/05/21 1800  BP: (!) 156/91 (!) 139/93 109/71 122/76  Pulse:  90 96 95  Resp: (!) 26 (!) 22 20 (!) 22  Temp: (!) 97.5 F (36.4 C)     TempSrc: Axillary      SpO2: 100% 100% 100% 97%  Weight: 82.1 kg     Height: 5\' 11"  (1.803 m)      Constitutional: appears older than chronological age, frail, cachectic, NAD, calm, comfortable Eyes: PERRL, lids and conjunctivae normal ENMT: Mucous membranes are dry. Age-appropriate dentition. Hearing appropriate Neck: normal, supple, no masses, no thyromegaly Respiratory: clear to auscultation bilaterally, no wheezing, no crackles. Normal respiratory effort. No accessory muscle use.  Cardiovascular: Regular rate and rhythm, no murmurs / rubs / gallops. No extremity edema. 2+ pedal pulses. No carotid bruits.  Abdomen: Suprapubic tenderness; no masses palpated, no hepatosplenomegaly. Bowel sounds positive.  Musculoskeletal: no clubbing / cyanosis. No joint deformity upper and lower extremities. Good ROM, no contractures, no atrophy. Normal muscle tone.  Skin: no rashes, lesions, ulcers. No induration Neurologic: Sensation intact. Strength 5/5 in all 4.  Psychiatric: Normal judgment and insight. Alert and oriented x 3. Normal mood.   EKG: independently reviewed, showing  sinus tachycardia with rate of 110, QTc 438, LVH  Chest x-ray on Admission: I personally reviewed and I agree with radiologist reading as below.  CT Head Wo Contrast  Result Date: 10/05/2021 CLINICAL DATA:  Increasing weakness since Friday EXAM: CT HEAD WITHOUT CONTRAST TECHNIQUE: Contiguous axial images were obtained from the base of the skull through the vertex without intravenous contrast. COMPARISON:  None. FINDINGS: Brain: No evidence of acute infarction, hemorrhage, hydrocephalus, extra-axial collection or mass lesion/mass effect. Vascular: No hyperdense vessel or unexpected calcification. Skull: No osseous abnormality. Sinuses/Orbits: Visualized paranasal sinuses are clear. Visualized mastoid sinuses are clear. Visualized orbits demonstrate no focal abnormality. Other: None IMPRESSION: No acute intracranial pathology. Electronically Signed    By: Kathreen Devoid M.D.   On: 10/05/2021 13:48   DG Chest Portable 1 View  Result Date: 10/05/2021 CLINICAL DATA:  Weakness EXAM: PORTABLE CHEST 1 VIEW COMPARISON:  None. FINDINGS: Normal heart size. Normal mediastinal contour. No pneumothorax. No pleural effusion. Two small indistinct nodular opacities in the right lung measuring 7 mm in the upper right lung and 8 mm in the lower right lung. Clear left lung. IMPRESSION: Two small indistinct nodular opacities in the right lung. Chest CT with IV contrast recommended to evaluate for pulmonary nodules. Electronically Signed   By: Ilona Sorrel M.D.   On: 10/05/2021 13:27    Labs on Admission: I have personally reviewed following labs  CBC: Recent Labs  Lab 10/05/21 1130 10/05/21 1252  WBC 38.9* 32.7*  NEUTROABS  --  27.5*  HGB 15.7 14.7  HCT 47.1 43.2  MCV 82.2 84.2  PLT 416* 161   Basic Metabolic Panel:  Recent Labs  Lab 10/05/21 1130 10/05/21 1452 10/05/21 1632  NA 128*  --  132*  K 4.9  --  4.2  CL 96*  --  105  CO2 12*  --  16*  GLUCOSE 511*  --  406*  BUN 42*  --  38*  CREATININE 1.68*  --  1.36*  CALCIUM 9.2  --  8.2*  PHOS  --  5.3*  --    GFR: Estimated  Creatinine Clearance: 54.6 mL/min (A) (by C-G formula based on SCr of 1.36 mg/dL (H)).  Liver Function Tests: Recent Labs  Lab 10/05/21 1130  AST 19  ALT 15  ALKPHOS 137*  BILITOT 1.5*  PROT 6.2*  ALBUMIN 2.5*   CBG: Recent Labs  Lab 10/05/21 1516 10/05/21 1550 10/05/21 1625 10/05/21 1721 10/05/21 1820  GLUCAP 428* 456* 410* 356* 279*   Urine analysis:    Component Value Date/Time   COLORURINE YELLOW (A) 10/05/2021 1252   APPEARANCEUR HAZY (A) 10/05/2021 1252   LABSPEC 1.018 10/05/2021 1252   PHURINE 5.0 10/05/2021 1252   GLUCOSEU >=500 (A) 10/05/2021 1252   HGBUR MODERATE (A) 10/05/2021 1252   BILIRUBINUR NEGATIVE 10/05/2021 1252   KETONESUR 80 (A) 10/05/2021 1252   PROTEINUR 100 (A) 10/05/2021 1252   NITRITE NEGATIVE 10/05/2021 1252    LEUKOCYTESUR SMALL (A) 10/05/2021 1252   CRITICAL CARE Performed by: Briant Cedar [place]  Total critical care time: 35 minutes  Critical care time was exclusive of separately billable procedures and treating other patients.  Critical care was necessary to treat or prevent imminent or life-threatening deterioration.  Endocrine crisis, severe sepsis with organ dysfunction  Critical care was time spent personally by me on the following activities: development of treatment plan with patient and/or surrogate as well as nursing, discussions with consultants, evaluation of patient's response to treatment, examination of patient, obtaining history from patient or surrogate, ordering and performing treatments and interventions, ordering and review of laboratory studies, ordering and review of radiographic studies, pulse oximetry and re-evaluation of patient's condition.   Dr. Tobie Poet [facility]  If 7PM-7AM, please contact overnight-coverage provider If 7AM-7PM, please contact day coverage provider [email or web address]  10/05/2021, 7:12 PM

## 2021-10-06 ENCOUNTER — Inpatient Hospital Stay
Admit: 2021-10-06 | Discharge: 2021-10-06 | Disposition: A | Discharge status: Home / Self Care | Payer: BC Managed Care – PPO | Attending: Internal Medicine | Admitting: Internal Medicine

## 2021-10-06 ENCOUNTER — Inpatient Hospital Stay: Payer: BC Managed Care – PPO

## 2021-10-06 DIAGNOSIS — R7881 Bacteremia: Secondary | ICD-10-CM | POA: Diagnosis not present

## 2021-10-06 DIAGNOSIS — E1110 Type 2 diabetes mellitus with ketoacidosis without coma: Secondary | ICD-10-CM | POA: Diagnosis not present

## 2021-10-06 DIAGNOSIS — B9562 Methicillin resistant Staphylococcus aureus infection as the cause of diseases classified elsewhere: Secondary | ICD-10-CM

## 2021-10-06 DIAGNOSIS — N179 Acute kidney failure, unspecified: Secondary | ICD-10-CM | POA: Diagnosis not present

## 2021-10-06 DIAGNOSIS — E440 Moderate protein-calorie malnutrition: Secondary | ICD-10-CM | POA: Insufficient documentation

## 2021-10-06 LAB — BLOOD CULTURE ID PANEL (REFLEXED) - BCID2

## 2021-10-06 LAB — BASIC METABOLIC PANEL
Anion gap: 8 (ref 5–15)
Anion gap: 5 (ref 5–15)
BUN: 34 mg/dL — ABNORMAL HIGH (ref 8–23)
BUN: 31 mg/dL — ABNORMAL HIGH (ref 8–23)
CO2: 15 mmol/L — ABNORMAL LOW (ref 22–32)
CO2: 18 mmol/L — ABNORMAL LOW (ref 22–32)
Calcium: 8 mg/dL — ABNORMAL LOW (ref 8.9–10.3)
Calcium: 8.2 mg/dL — ABNORMAL LOW (ref 8.9–10.3)
Chloride: 107 mmol/L (ref 98–111)
Chloride: 111 mmol/L (ref 98–111)
Creatinine, Ser: 1.01 mg/dL (ref 0.61–1.24)
Creatinine, Ser: 0.92 mg/dL (ref 0.61–1.24)
GFR, Estimated: >60 mL/min (ref >60)
GFR, Estimated: >60 mL/min (ref >60)
Glucose, Bld: 212 mg/dL — ABNORMAL HIGH (ref 70–99)
Glucose, Bld: 149 mg/dL — ABNORMAL HIGH (ref 70–99)
Potassium: 3.5 mmol/L (ref 3.5–5.1)
Potassium: 3.8 mmol/L (ref 3.5–5.1)
Sodium: 130 mmol/L — ABNORMAL LOW (ref 135–145)
Sodium: 134 mmol/L — ABNORMAL LOW (ref 135–145)

## 2021-10-06 LAB — PROCALCITONIN: Procalcitonin: 1.37 ng/mL

## 2021-10-06 LAB — GLUCOSE, CAPILLARY
Glucose-Capillary: 127 mg/dL — ABNORMAL HIGH (ref 70–99)
Glucose-Capillary: 137 mg/dL — ABNORMAL HIGH (ref 70–99)
Glucose-Capillary: 146 mg/dL — ABNORMAL HIGH (ref 70–99)
Glucose-Capillary: 152 mg/dL — ABNORMAL HIGH (ref 70–99)
Glucose-Capillary: 158 mg/dL — ABNORMAL HIGH (ref 70–99)
Glucose-Capillary: 182 mg/dL — ABNORMAL HIGH (ref 70–99)
Glucose-Capillary: 199 mg/dL — ABNORMAL HIGH (ref 70–99)
Glucose-Capillary: 204 mg/dL — ABNORMAL HIGH (ref 70–99)
Glucose-Capillary: 279 mg/dL — ABNORMAL HIGH (ref 70–99)
Glucose-Capillary: 332 mg/dL — ABNORMAL HIGH (ref 70–99)

## 2021-10-06 LAB — HEMOGLOBIN A1C
Hgb A1c MFr Bld: 11.4 % — ABNORMAL HIGH (ref 4.8–5.6)
Hgb A1c MFr Bld: 11.4 % — ABNORMAL HIGH (ref 4.8–5.6)
Mean Plasma Glucose: 280 mg/dL
Mean Plasma Glucose: 280 mg/dL

## 2021-10-06 LAB — BETA-HYDROXYBUTYRIC ACID: Beta-Hydroxybutyric Acid: 0.18 mmol/L (ref 0.05–0.27)

## 2021-10-06 LAB — BLOOD GAS, VENOUS
Acid-base deficit: 18.8 mmol/L — ABNORMAL HIGH (ref 0.0–2.0)
Bicarbonate: 8.5 mmol/L — ABNORMAL LOW (ref 20.0–28.0)
O2 Saturation: 38.8 %
Patient temperature: 37
pCO2, Ven: 25 mmHg — ABNORMAL LOW (ref 44.0–60.0)
pH, Ven: 7.14 — CL (ref 7.250–7.430)

## 2021-10-06 LAB — CBC
HCT: 35.4 % — ABNORMAL LOW (ref 39.0–52.0)
Hemoglobin: 12.2 g/dL — ABNORMAL LOW (ref 13.0–17.0)
MCH: 27.2 pg (ref 26.0–34.0)
MCHC: 34.5 g/dL (ref 30.0–36.0)
MCV: 79 fL — ABNORMAL LOW (ref 80.0–100.0)
Platelets: 269 10*3/uL (ref 150–400)
RBC: 4.48 MIL/uL (ref 4.22–5.81)
RDW: 13.7 % (ref 11.5–15.5)
WBC: 22 10*3/uL — ABNORMAL HIGH (ref 4.0–10.5)
nRBC: 0 % (ref 0.0–0.2)

## 2021-10-06 LAB — HIV ANTIBODY (ROUTINE TESTING W REFLEX): HIV Screen 4th Generation wRfx: NONREACTIVE

## 2021-10-06 IMAGING — MR MR LUMBAR SPINE WO/W CM
6 of 7 series · 32 of 48 positions shown · IV contrast (gadavist)
Comparison: None.

CLINICAL DATA: MRSA bacteremia with back pain

EXAM:
MRI LUMBAR SPINE WITHOUT AND WITH CONTRAST
TECHNIQUE: Multiplanar and multiecho pulse sequences of the lumbar spine were
obtained without and with intravenous contrast.
CONTRAST:  8mL GADAVIST GADOBUTROL 1 MMOL/ML IV SOLN

[Series 5: T2 · sagittal · 4.0mm · 0.81mm/px · 5 of 17 slices shown (1 of 2)]
[im 1/17]
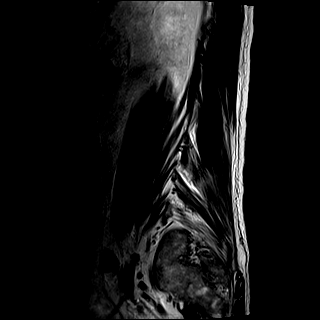
[im 5/17]
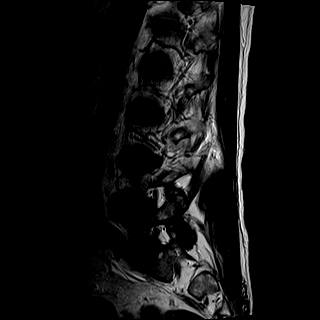
[im 9/17]
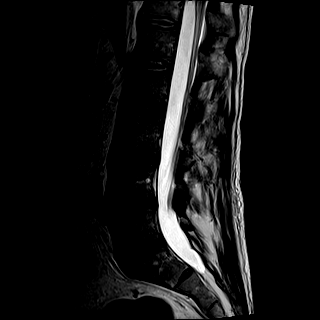
[im 13/17]
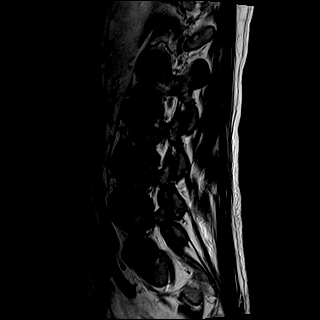
[im 17/17]
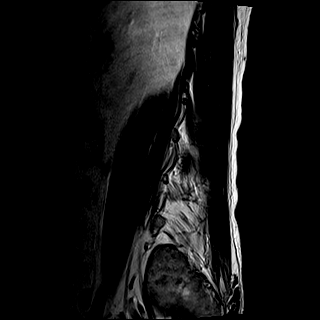

[Series 6: T1 · sagittal · 4.0mm · 0.81mm/px · 5 of 17 slices shown (1 of 2)]
[im 1/17]
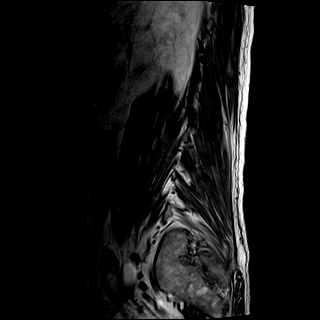
[im 5/17]
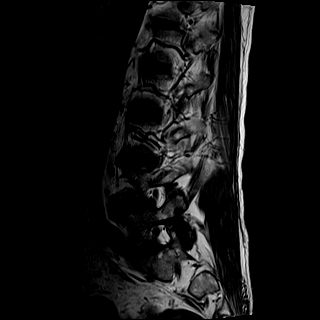
[im 9/17]
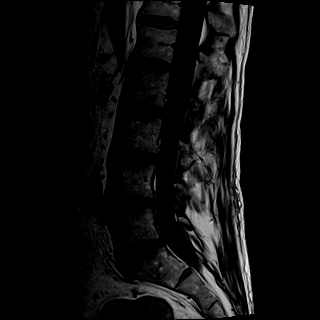
[im 13/17]
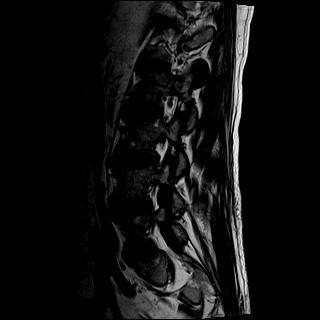
[im 17/17]
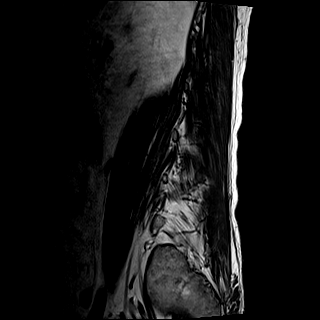

[Series 7: STIR · sagittal · 4.0mm · 0.41mm/px · 2 of 17 slices shown]
[im 1/17]
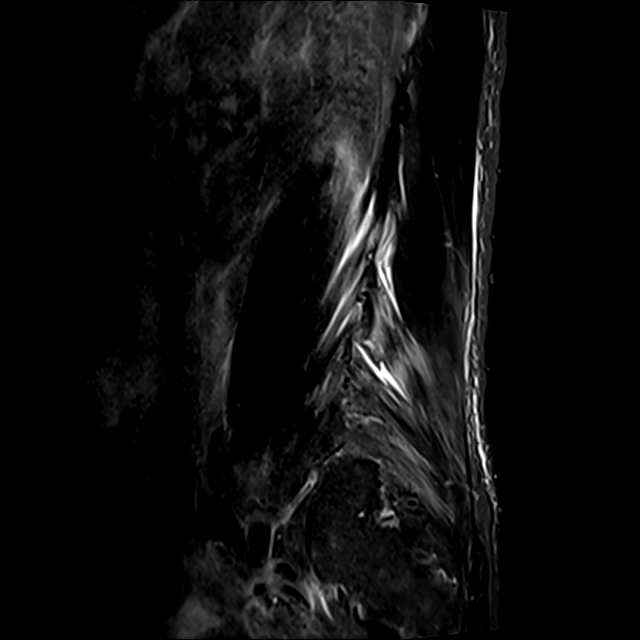
[im 6/17]
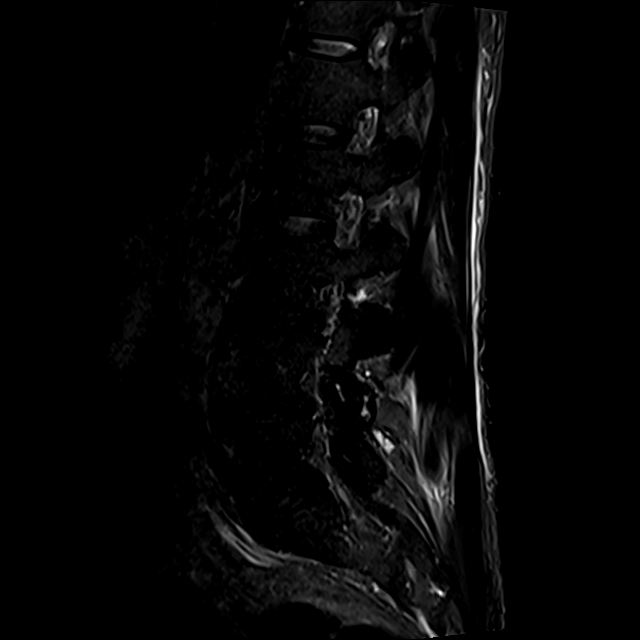

[Series 8: T2 · axial · 4.0mm · 0.78mm/px · z∈[-97,+111]mm · 8 of 39 slices shown (2 of 2)]
[im 1/39]
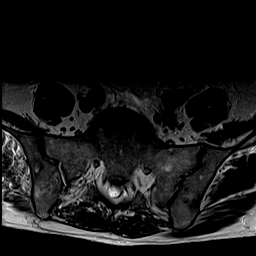
[im 5/39]
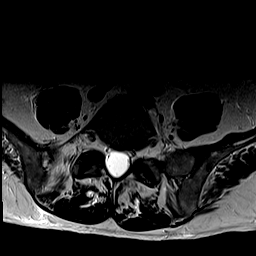
[im 13/39]
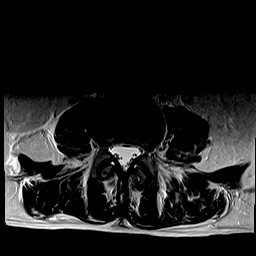
[im 17/39]
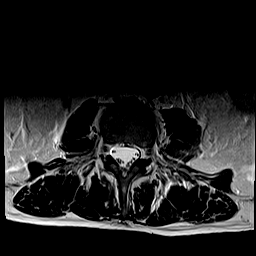
[im 22/39]
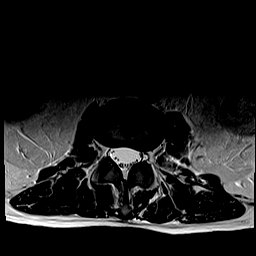
[im 26/39]
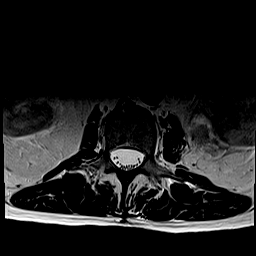
[im 34/39]
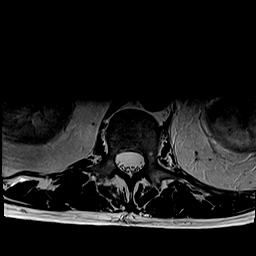
[im 39/39]
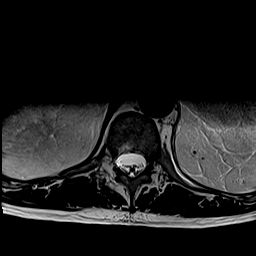

[Series 9: T1 · axial · 4.0mm · 0.39mm/px · z∈[-97,+111]mm · 8 of 39 slices shown (2 of 2)]
[im 1/39]
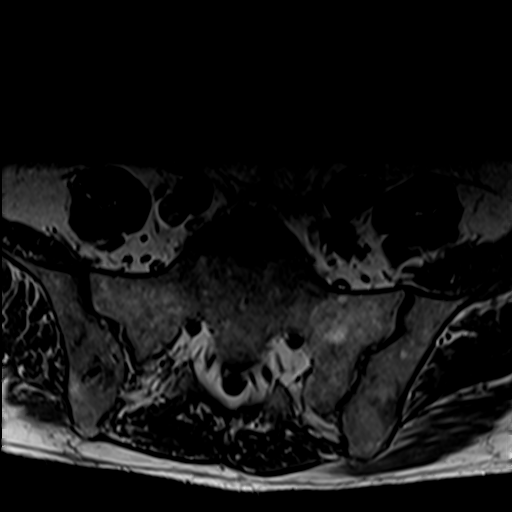
[im 5/39]
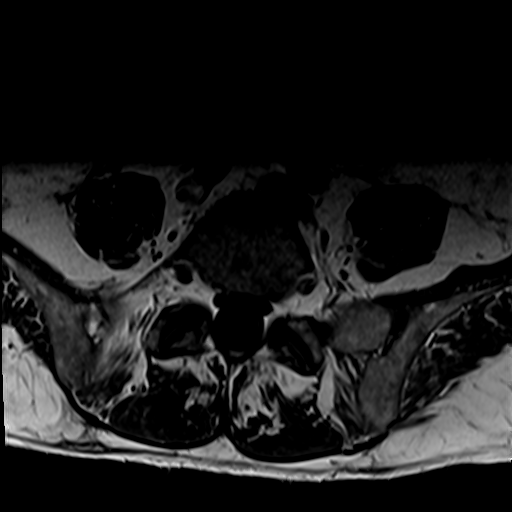
[im 13/39]
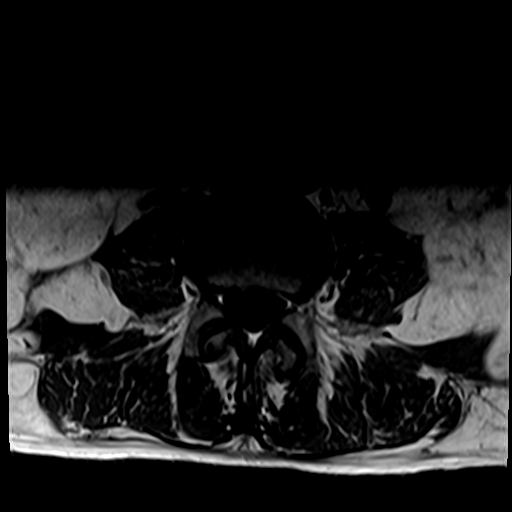
[im 17/39]
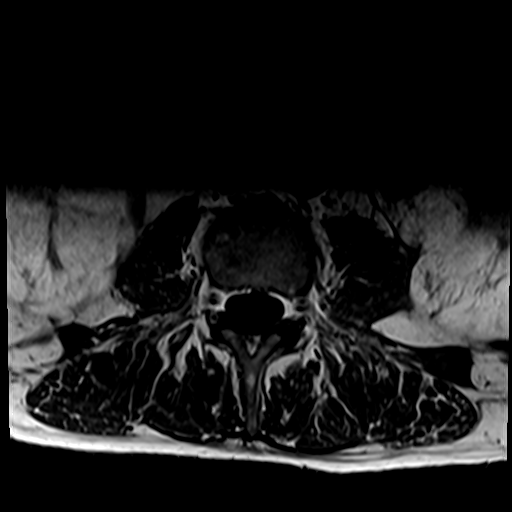
[im 22/39]
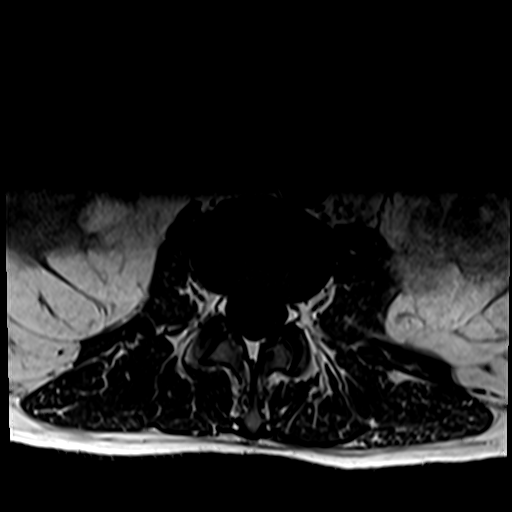
[im 26/39]
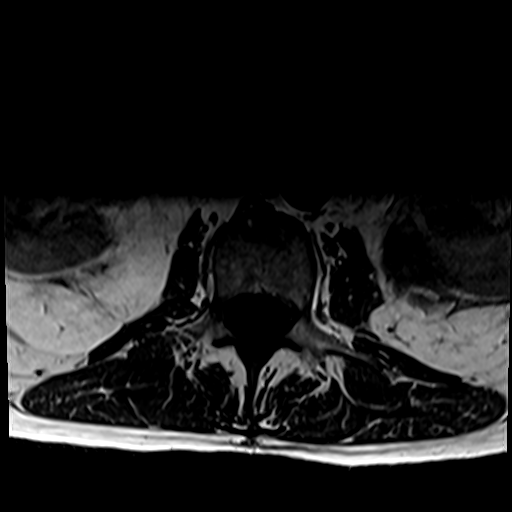
[im 34/39]
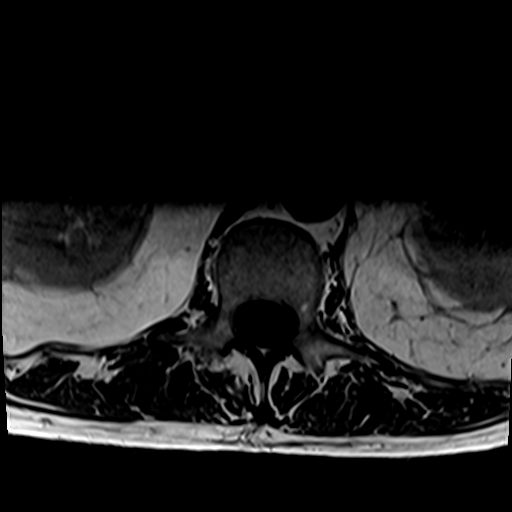
[im 39/39]
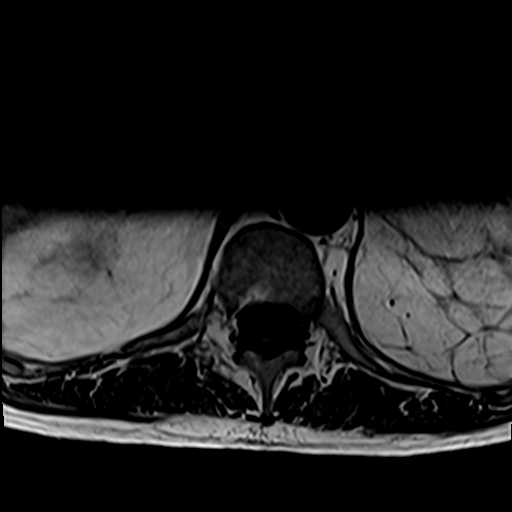

[Series 49: T1 fat-sat post-contrast · sagittal · 4.0mm · 0.81mm/px · 4 of 17 slices shown]
[im 1/17]
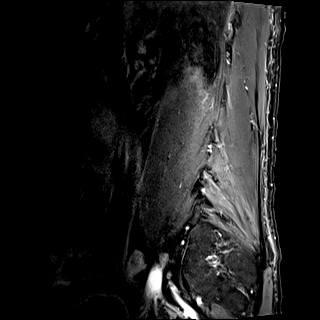
[im 6/17]
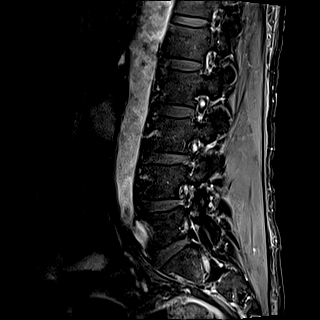
[im 11/17]
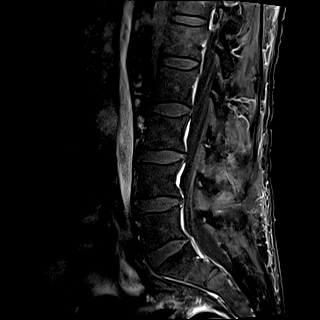
[im 17/17]
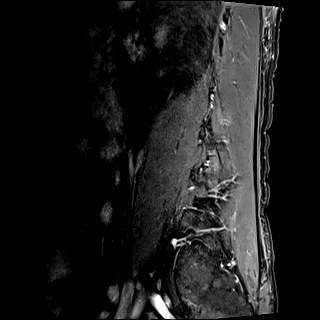

[32 of 48 positions shown; findings below may reference images not displayed]

FINDINGS: Segmentation:  Standard.

Alignment:  Physiologic.

Vertebrae:  No fracture, evidence of discitis, or bone lesion.

Conus medullaris and cauda equina: Conus extends to the L1 level.
Conus and cauda equina appear normal.

Paraspinal and other soft tissues: Negative.

Disc levels:

The T12-L4 disc levels are normal.

L4-5: Small disc bulge with mild facet hypertrophy. No spinal canal
or neural foraminal stenosis.

L5-S1: Small disc bulge with annular fissure. Mild facet
hypertrophy. No spinal canal or neural foraminal stenosis.

No abnormal contrast enhancement.
IMPRESSION: 1. No epidural abscess or discitis/osteomyelitis.
2. Mild lower lumbar degenerative disc disease without spinal canal
or neural foraminal stenosis.

## 2021-10-06 IMAGING — MR MR THORACIC SPINE WO/W CM
7 of 9 series · 30 of 48 positions shown · IV contrast (gadavist)
Comparison: None.

CLINICAL DATA: Intervertebral disc disorder

EXAM:
MRI THORACIC WITHOUT AND WITH CONTRAST
TECHNIQUE: Multiplanar and multiecho pulse sequences of the thoracic spine were
obtained without and with intravenous contrast.
CONTRAST:  8mL GADAVIST GADOBUTROL 1 MMOL/ML IV SOLN

[Series 40: T1 · sagittal · 5.0mm · 1.88mm/px · 1 of 9 slices shown (1 of 3)]
[im 1/9]
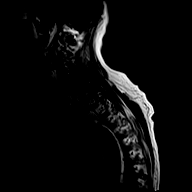

[Series 41: T2 · sagittal · 3.0mm · 1.06mm/px · 3 of 17 slices shown (1 of 2)]
[im 1/17]
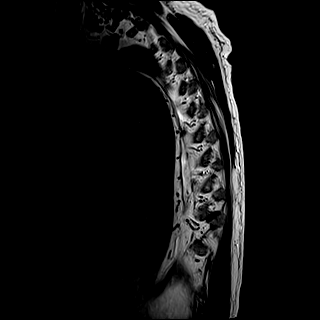
[im 9/17]
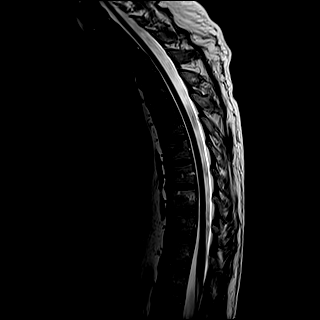
[im 17/17]
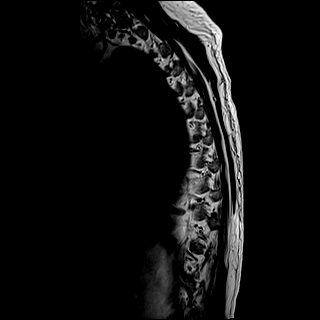

[Series 42: T1 · sagittal · 3.0mm · 1.06mm/px · 4 of 17 slices shown (2 of 3)]
[im 1/17]
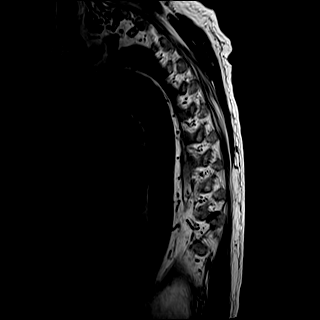
[im 6/17]
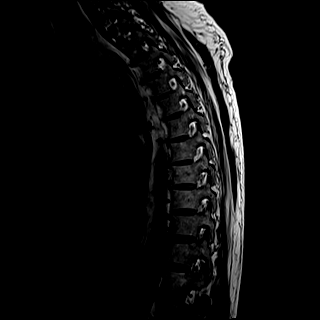
[im 11/17]
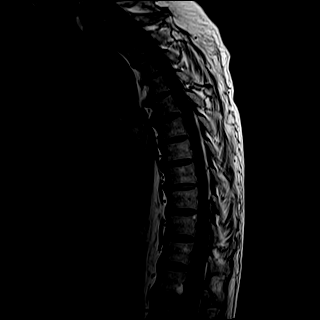
[im 17/17]
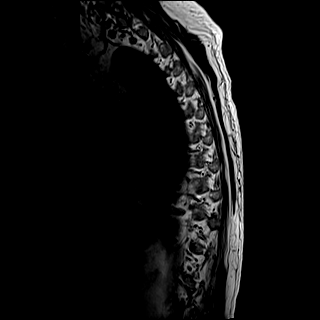

[Series 43: STIR · sagittal · 3.0mm · 0.53mm/px · 2 of 17 slices shown]
[im 1/17]
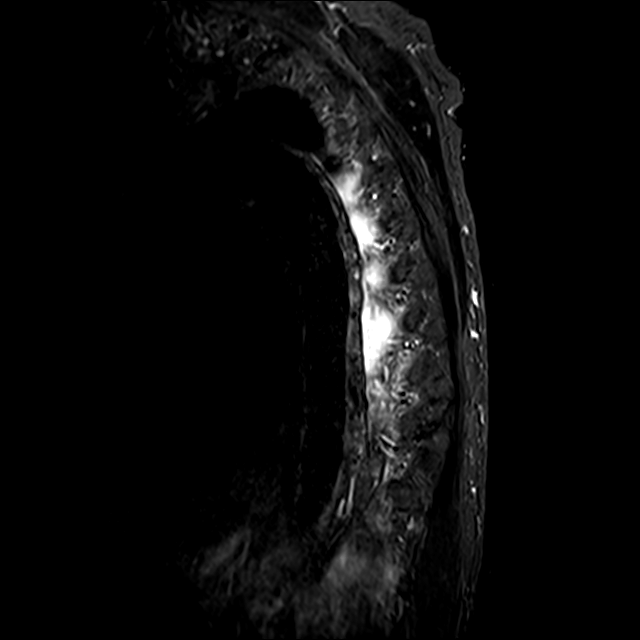
[im 6/17]
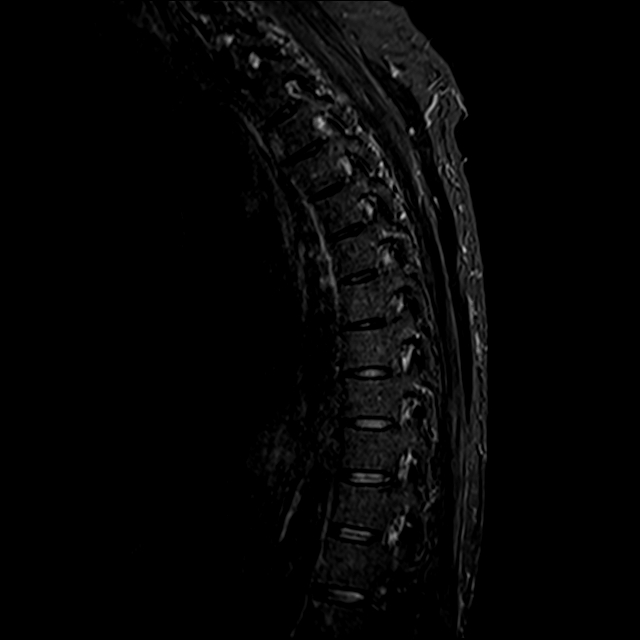

[Series 44: T2 · axial · 4.0mm · 0.59mm/px · z∈[-349,+3]mm · 8 of 39 slices shown (2 of 2)]
[im 1/39]
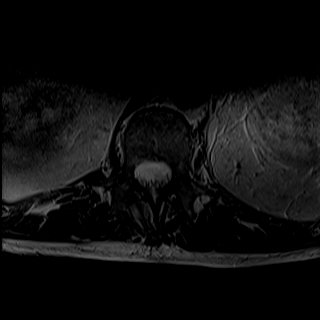
[im 6/39]
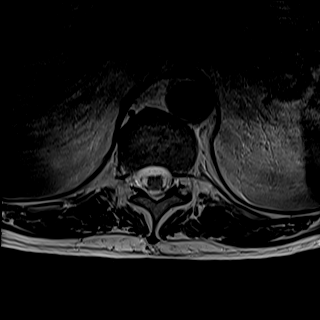
[im 11/39]
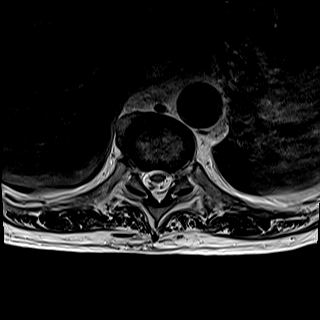
[im 17/39]
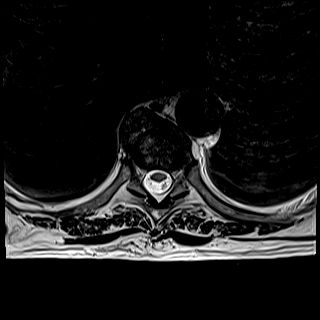
[im 22/39]
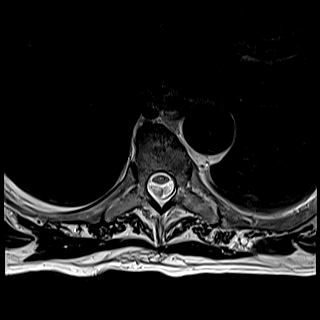
[im 28/39]
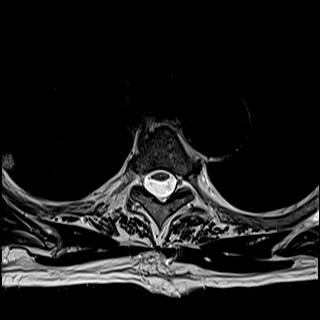
[im 33/39]
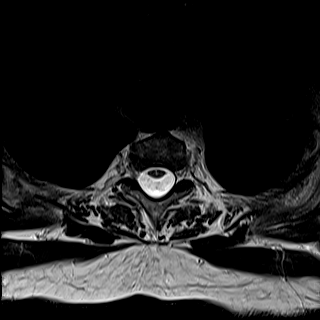
[im 39/39]
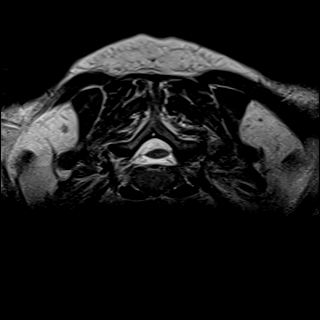

[Series 45: T1 · axial · non-contrast · 4.0mm · 0.37mm/px · z∈[-349,+3]mm · 8 of 39 slices shown (3 of 3)]
[im 1/39]
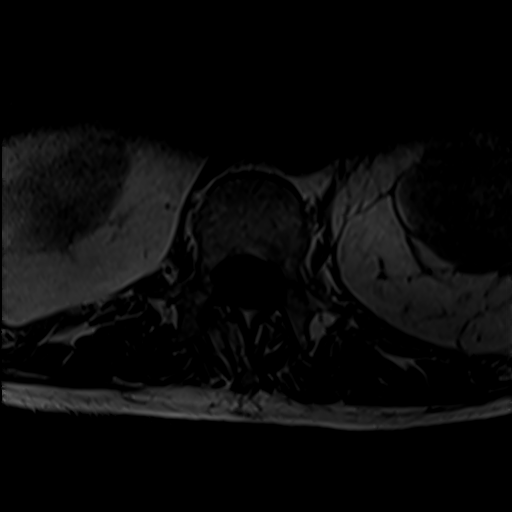
[im 6/39]
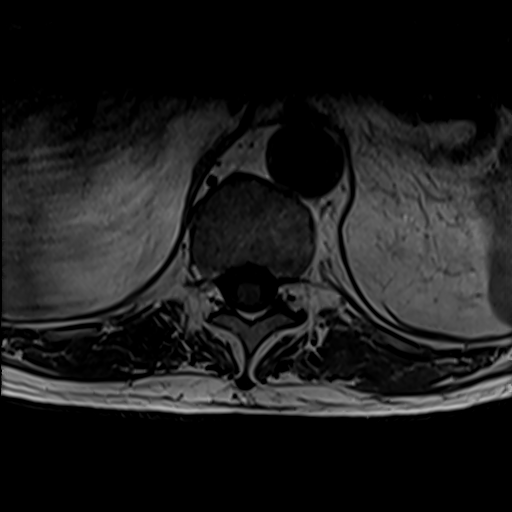
[im 11/39]
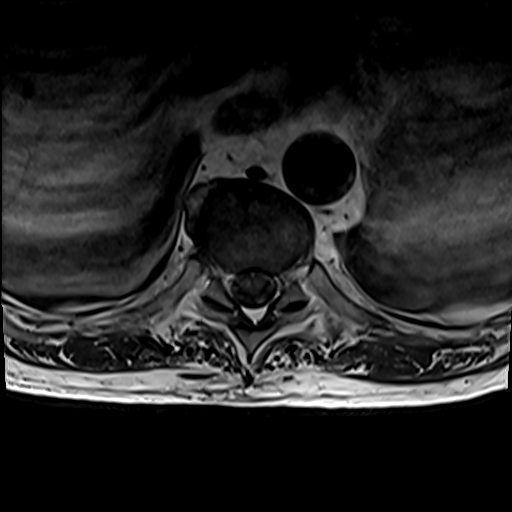
[im 17/39]
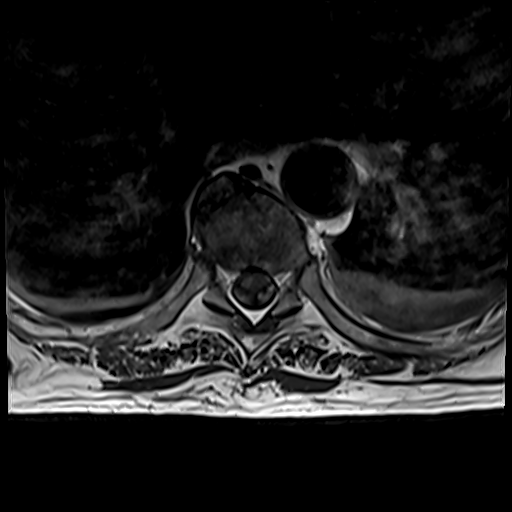
[im 22/39]
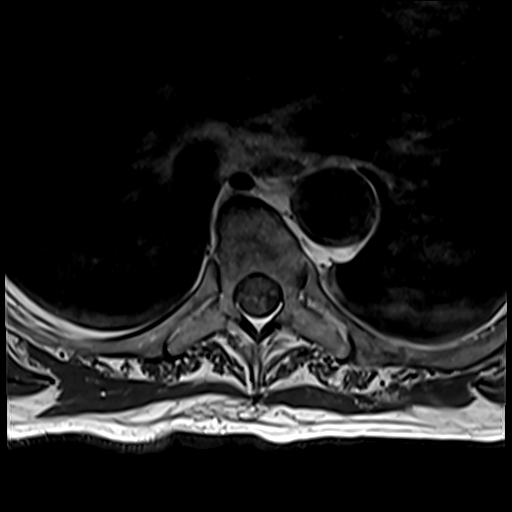
[im 28/39]
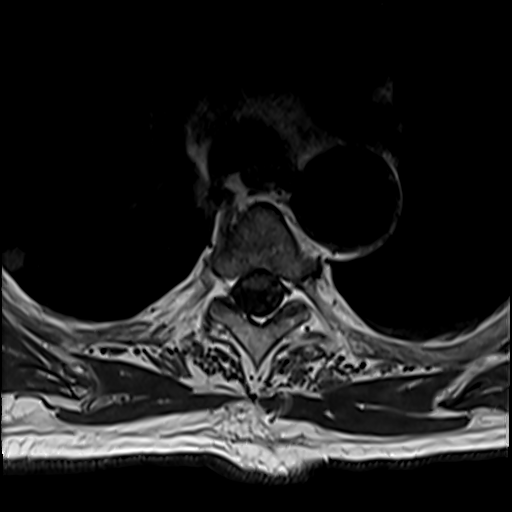
[im 33/39]
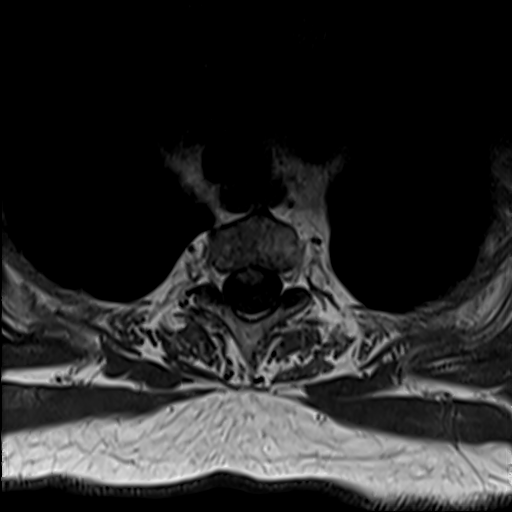
[im 39/39]
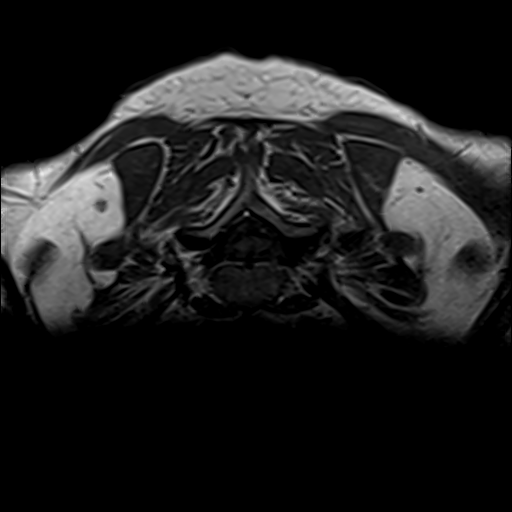

[Series 48: T1 fat-sat post-contrast · sagittal · 3.0mm · 1.06mm/px · 4 of 17 slices shown]
[im 1/17]
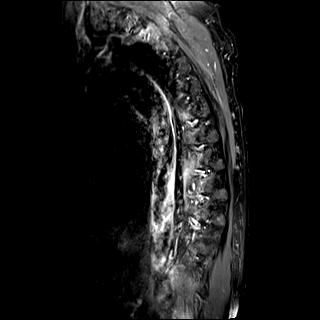
[im 6/17]
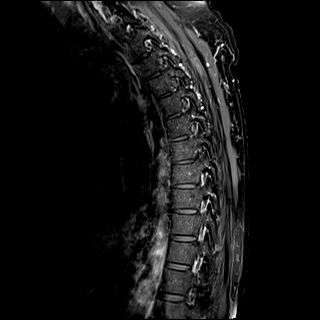
[im 11/17]
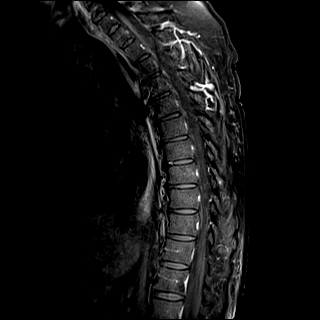
[im 17/17]
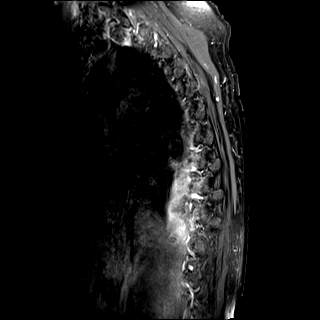

[30 of 48 positions shown; findings below may reference images not displayed]

FINDINGS: Alignment:  Physiologic.

Vertebrae: No fracture, evidence of discitis, or bone lesion.

Cord:  Normal signal and morphology.

Paraspinal and other soft tissues: Small pleural effusions

Disc levels:

No spinal canal stenosis.

No contrast enhancement.
IMPRESSION: 1. Normal thoracic spine.
2. Small pleural effusions.

## 2021-10-06 MED ORDER — INSULIN ASPART 100 UNIT/ML IJ SOLN
0.0000 [IU] | Freq: Three times a day (TID) | INTRAMUSCULAR | Status: DC
  Administered 2021-10-06: 15 [IU] via SUBCUTANEOUS
  Administered 2021-10-07: 11 [IU] via SUBCUTANEOUS
  Administered 2021-10-07: 4 [IU] via SUBCUTANEOUS
  Administered 2021-10-07: 11 [IU] via SUBCUTANEOUS
  Administered 2021-10-08: 18:00:00 4 [IU] via SUBCUTANEOUS
  Administered 2021-10-08: 11:00:00 7 [IU] via SUBCUTANEOUS
  Administered 2021-10-08: 15 [IU] via SUBCUTANEOUS
  Administered 2021-10-09: 11 [IU] via SUBCUTANEOUS
  Administered 2021-10-09: 7 [IU] via SUBCUTANEOUS
  Administered 2021-10-10 – 2021-10-11 (×4): 4 [IU] via SUBCUTANEOUS
  Filled 2021-10-06 (×13): qty 1

## 2021-10-06 MED ORDER — NEPRO/CARBSTEADY PO LIQD
237.0000 mL | Freq: Three times a day (TID) | ORAL | Status: DC
  Administered 2021-10-06 – 2021-10-11 (×7): 237 mL via ORAL

## 2021-10-06 MED ORDER — INSULIN ASPART 100 UNIT/ML IJ SOLN: 5.0000 [IU] | Freq: Three times a day (TID) | INTRAMUSCULAR | Status: DC

## 2021-10-06 MED ORDER — INSULIN ASPART 100 UNIT/ML IJ SOLN
0.0000 [IU] | Freq: Three times a day (TID) | INTRAMUSCULAR | Status: DC
  Administered 2021-10-06: 7 [IU] via SUBCUTANEOUS
  Administered 2021-10-06: 3 [IU] via SUBCUTANEOUS
  Filled 2021-10-06 (×3): qty 1

## 2021-10-06 MED ORDER — ADULT MULTIVITAMIN W/MINERALS CH
1.0000 | ORAL_TABLET | Freq: Every day | ORAL | Status: DC
  Administered 2021-10-07 – 2021-10-11 (×4): 1 via ORAL
  Filled 2021-10-06 (×4): qty 1

## 2021-10-06 MED ORDER — GADOBUTROL 1 MMOL/ML IV SOLN
8.0000 mL | Freq: Once | INTRAVENOUS | Status: AC | PRN
  Administered 2021-10-06: 8 mL via INTRAVENOUS

## 2021-10-06 MED ORDER — INSULIN ASPART 100 UNIT/ML IJ SOLN
4.0000 [IU] | Freq: Three times a day (TID) | INTRAMUSCULAR | Status: DC
  Administered 2021-10-06: 4 [IU] via SUBCUTANEOUS
  Filled 2021-10-06: qty 1

## 2021-10-06 MED ORDER — INSULIN ASPART 100 UNIT/ML IJ SOLN
0.0000 [IU] | Freq: Every day | INTRAMUSCULAR | Status: DC
Start: 2021-10-06 — End: 2021-10-11
  Administered 2021-10-06: 3 [IU] via SUBCUTANEOUS
  Administered 2021-10-09: 22:00:00 2 [IU] via SUBCUTANEOUS
  Filled 2021-10-06 (×2): qty 1

## 2021-10-06 MED ORDER — VANCOMYCIN HCL IN DEXTROSE 1-5 GM/200ML-% IV SOLN
1000.0000 mg | Freq: Two times a day (BID) | INTRAVENOUS | Status: DC
  Administered 2021-10-06 – 2021-10-09 (×8): 1000 mg via INTRAVENOUS
  Filled 2021-10-06 (×10): qty 200

## 2021-10-06 MED ORDER — INSULIN GLARGINE-YFGN 100 UNIT/ML ~~LOC~~ SOLN
10.0000 [IU] | Freq: Every day | SUBCUTANEOUS | Status: DC
  Administered 2021-10-06: 10 [IU] via SUBCUTANEOUS
  Filled 2021-10-06: qty 0.1

## 2021-10-06 MED ORDER — INSULIN GLARGINE-YFGN 100 UNIT/ML ~~LOC~~ SOLN
10.0000 [IU] | Freq: Two times a day (BID) | SUBCUTANEOUS | Status: DC
  Administered 2021-10-06: 10 [IU] via SUBCUTANEOUS
  Filled 2021-10-06 (×2): qty 0.1

## 2021-10-06 NOTE — Consult Note (Signed)
NAME: Travis Travis [name].  DOB: 09/08/1951  MRN: [id number]  Date/Time: 10/06/2021 7:23 PM  REQUESTING PROVIDER: Dr. Reesa Chew Subjective:  REASON FOR CONSULT: MRSA bacteremia ? Travis Travis [name]. is a 70 y.o. 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 History obtained from patient.  As per patient the weekend following Labor Day he was vacationing with his nephew and had a drink too many.  He then had forceful urination followed by intermittent urination.  He was also had incontinence and had to wear pads.  He went to see his cancer doctor on 09/17/2021 and they found that he had not enlarged bladder and also acute kidney injury with creatinine going from 1.28-3.80.  They referred him to the ED.  In the ED bladder scan showed volume of 1200 cc and a catheter was placed with return of 1700 cc of clear yellow urine.  This In-[name]-Out catheter was then changed to a larger size Foley catheter which resulted in hematuria.  In the ED he underwent irrigation to resolve the hematuria and which  improved and he was discharged from the ED to follow-up with urologist..  No cultures were sent. He saw Dr. Erlene Quan on 09/19/2021 and during that visit the urine was noted to be reddish in color.  The catheter was irrigated with 1 L of sterile saline.  Initially a few small dime size clots were evacuated the urine began to clear quickly.  He was started on Flomax and asked to come back for a voiding trial.  He returned on 09/25/2021 and underwent a voiding trial and the bladder Foley catheter was removed and he was asked to drink fluids and come back in the afternoon for a bladder scan.  In the few hours time he had not passed urine and neither did he have the urge to pass urine.  The bladder scan revealed 174 cc of urine.  He was sent home on a few catheters for self-catheterization if needed.  He returned to  the ED on 09/28/2021 as he had ran out of catheter.  A Foley catheter was placed and he was discharged home.  His blood sugars were high at home and he had a televisit with his physician on 10/03/2021 and was prescribed Low-dose glipizide to be taken along with metformin.  He returned to the ED on 10/05/2021 with 3-day history of lethargy, weakness and high blood sugar.  He did not have any fever.  He had mid back pain which she thought was because of lying in bed for a long time. In the ED BP 108/72, temperature 98, pulse 98, respiratory 21, sats 93%. WBC 32.7, Hb 14.7, platelet 326 and creatinine 1.68.   Blood glucose was 511, CO2 12, sodium 128, potassium 4.9 and uric acid 10. He was diagnosed with DKA and started on insulin drip and IV fluids.  Blood cultures were sent.  And he was started on Vanco and cefepime.  CT head showed no acute findings.  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  UA showed Travis [name] than 50 WBC and a urine culture was sent. I am seeing the patient because the blood culture is positive for MRSA. 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 Past Surgical History:  Procedure Laterality Date   COLONOSCOPY     ESOPHAGOGASTRODUODENOSCOPY (EGD) WITH PROPOFOL [name]/A 03/14/2018   Procedure: ESOPHAGOGASTRODUODENOSCOPY (EGD) WITH PROPOFOL;  Surgeon: [name], MD;  Location: [facility] ENDOSCOPY;  Service: Gastroenterology;  Laterality: [name]/A;   ESOPHAGOGASTRODUODENOSCOPY (EGD) WITH PROPOFOL [name]/A 06/13/2018   Procedure: ESOPHAGOGASTRODUODENOSCOPY (EGD) WITH PROPOFOL;  Surgeon: [name], MD;  Location: [facility] ENDOSCOPY;  Service: Gastroenterology;  Laterality: [name]/A;   WISDOM TOOTH EXTRACTION      Social History   Socioeconomic History   Marital status: Married     Spouse name: Not on file   Number of children: Not on file   Years of education: Not on file   Highest education level: Not on file  Occupational History   Not on file  Tobacco Use   Smoking status: Never   Smokeless tobacco: Never  Vaping Use   Vaping Use: Never used  Substance and Sexual Activity   Alcohol use: Yes    Comment: rarely   Drug use: No   Sexual activity: Yes  Other Topics Concern   Not on file  Social History Narrative   Not on file   Social Determinants of Health   Financial Resource Strain: Not on file  Food Insecurity: Not on file  Transportation Needs: Not on file  Physical Activity: Not on file  Stress: Not on file  Social Connections: Not on file  Intimate Partner Violence: Not on file    Family History  Problem Relation Age of Onset   Leukemia Mother    Colon cancer Father    Diabetes Father    Heart disease Father    Diabetes Brother    No Known Allergies I? Current Facility-Administered Medications  Medication Dose Route Frequency Provider Last Rate Last Admin   Chlorhexidine Gluconate Cloth 2 % PADS 6 each  6 each Topical Q0600 Cox, Amy N, DO       Chlorhexidine Gluconate Cloth 2 % PADS 6 each  6 each Topical Q0600 Cox, Amy N, DO   6 each at 10/06/21 0540   citalopram (CELEXA) tablet 40 mg  40 mg Oral Daily Cox, Amy N, DO   40 mg at 10/06/21 1122   dextrose 50 % solution 0-50 mL  0-50 mL Intravenous PRN Cox, Amy N, DO       feeding supplement (NEPRO CARB STEADY) liquid 237 mL  237 mL Oral TID BM Lorella Nimrod, MD   237 mL at 10/06/21 1404   heparin injection 5,000 Units  5,000 Units Subcutaneous Q8H Cox, Amy N, DO   5,000 Units at 10/06/21 1358   ibrutinib TABS 420 mg  420 mg Oral Daily Cox, Amy N, DO   420 mg at 10/06/21 1424   insulin aspart (novoLOG) injection 0-20 Units  0-20 Units Subcutaneous TID WC Lorella Nimrod, MD   15 Units at 10/06/21 1545   insulin aspart (novoLOG) injection 0-5 Units  0-5 Units Subcutaneous QHS Lorella Nimrod, MD       insulin aspart (novoLOG) injection 4 Units  4 Units Subcutaneous TID WC Lorella Nimrod, MD   4 Units at 10/06/21 1544   insulin glargine-yfgn (SEMGLEE) injection 10 Units  10 Units Subcutaneous BID Lorella Nimrod, MD       melatonin tablet 5 mg  5 mg Oral QHS PRN Cox, Amy N, DO       morphine 2 MG/ML injection 1 mg  1 mg Intravenous Q4H PRN Cox, Amy  N, DO       [START ON 10/07/2021] multivitamin with minerals tablet 1 tablet  1 tablet Oral Daily Lorella Nimrod, MD       mupirocin ointment (BACTROBAN) 2 % 1 application  1 application Nasal BID Cox, Amy N, DO   1 application at 69/48/54 1122   vancomycin (VANCOCIN) IVPB 1000 mg/200 mL premix  1,000 mg Intravenous Q12H Berton Mount, RPH 200 mL/hr at 10/06/21 1427 1,000 mg at 10/06/21 1427     Abtx:  Anti-infectives (From admission, onward)    Start     Dose/Rate Route Frequency Ordered Stop   10/06/21 1700  vancomycin (VANCOREADY) IVPB 1250 mg/250 mL  Status:  Discontinued        1,250 mg 166.7 mL/hr over 90 Minutes Intravenous Every 24 hours 10/05/21 1722 10/06/21 1230   10/06/21 1330  vancomycin (VANCOCIN) IVPB 1000 mg/200 mL premix        1,000 mg 200 mL/hr over 60 Minutes Intravenous Every 12 hours 10/06/21 1230     10/06/21 0200  ceFEPIme (MAXIPIME) 2 g in sodium chloride 0.9 % 100 mL IVPB  Status:  Discontinued        2 g 200 mL/hr over 30 Minutes Intravenous Every 12 hours 10/05/21 1632 10/06/21 1358   10/05/21 1730  vancomycin (VANCOCIN) IVPB 1000 mg/200 mL premix        1,000 mg 200 mL/hr over 60 Minutes Intravenous  Once 10/05/21 1632 10/05/21 1812   10/05/21 1700  metroNIDAZOLE (FLAGYL) IVPB 500 mg  Status:  Discontinued        500 mg 100 mL/hr over 60 Minutes Intravenous Every 12 hours 10/05/21 1607 10/06/21 0118   10/05/21 1430  ceFEPIme (MAXIPIME) 2 g in sodium chloride 0.9 % 100 mL IVPB        2 g 200 mL/hr over 30 Minutes Intravenous  Once 10/05/21 1418 10/05/21 1435   10/05/21 1430  metroNIDAZOLE  (FLAGYL) IVPB 500 mg        500 mg 100 mL/hr over 60 Minutes Intravenous  Once 10/05/21 1418 10/05/21 1507   10/05/21 1430  vancomycin (VANCOCIN) IVPB 1000 mg/200 mL premix        1,000 mg 200 mL/hr over 60 Minutes Intravenous  Once 10/05/21 1418 10/05/21 1900       REVIEW OF SYSTEMS:  Const: negative fever, negative chills, negative weight loss Eyes: negative diplopia or visual changes, negative eye pain ENT: negative coryza, negative sore throat Resp: negative cough, hemoptysis, dyspnea Cards: negative for chest pain, palpitations, lower extremity edema GU: As above General GI: Negative for abdominal pain, diarrhea, bleeding, constipation Skin: negative for rash and pruritus Heme: negative for easy bruising and gum/nose bleeding MS: Weakness, mid back pain  Neurolo: Lethargic, dizziness Psych: negative for feelings of anxiety, depression  Endocrine: Poorly controlled diabetes mellitus Allergy/Immunology-no known drug allergy ? Objective:  VITALS:  BP 123/76   Pulse (!) 101   Temp (!) 97.4 F (36.3 C) (Axillary)   Resp 20   Ht 5\' 11"  (1.803 m)   Wt 82.1 kg   SpO2 98%   BMI 25.24 kg/m  PHYSICAL EXAM:  General: Alert, cooperative, no distress, appears stated age.  Pale tired Head: Normocephalic, without obvious abnormality, atraumatic. Eyes: Conjunctivae clear, anicteric sclerae. Pupils are equal ENT Nares normal. No drainage or sinus tenderness. Lips, mucosa, and tongue normal. No Thrush Neck: Supple, symmetrical, no adenopathy, thyroid: non tender no carotid bruit and no JVD. Back: Tenderness lower thoracic spine on  the T11-T12 area Lungs: Lateral air entry.  Decreased in the bases. Heart: Tach cardia Abdomen: Soft, non-tender,not distended. Bowel sounds normal. No masses Foley in place Extremities: atraumatic, no cyanosis. No edema. No clubbing Skin: No rashes or lesions. Or bruising Lymph: Cervical, supraclavicular normal. Neurologic: Grossly  non-focal Pertinent Labs Lab Results CBC    Component Value Date/Time   WBC 22.0 (H) 10/06/2021 0546   RBC 4.48 10/06/2021 0546   HGB 12.2 (L) 10/06/2021 0546   HCT 35.4 (L) 10/06/2021 0546   PLT 269 10/06/2021 0546   MCV 79.0 (L) 10/06/2021 0546   MCH 27.2 10/06/2021 0546   MCHC 34.5 10/06/2021 0546   RDW 13.7 10/06/2021 0546   LYMPHSABS 1.0 10/05/2021 1252   MONOABS 2.0 (H) 10/05/2021 1252   EOSABS 0.0 10/05/2021 1252   BASOSABS 0.0 10/05/2021 1252    CMP Latest Ref Rng & Units 10/06/2021 10/06/2021 10/05/2021  Glucose 70 - 99 mg/dL 149(H) 212(H) 202(H)  BUN 8 - 23 mg/dL 31(H) 34(H) 34(H)  Creatinine 0.61 - 1.24 mg/dL 0.92 1.01 0.98  Sodium 135 - 145 mmol/L 134(L) 130(L) 131(L)  Potassium 3.5 - 5.1 mmol/L 3.8 3.5 4.0  Chloride 98 - 111 mmol/L 111 107 108  CO2 22 - 32 mmol/L 18(L) 15(L) 17(L)  Calcium 8.9 - 10.3 mg/dL 8.2(L) 8.0(L) 8.2(L)  Total Protein 6.5 - 8.1 g/dL - - -  Total Bilirubin 0.3 - 1.2 mg/dL - - -  Alkaline Phos 38 - 126 U/L - - -  AST 15 - 41 U/L - - -  ALT 0 - 44 U/L - - -      Microbiology: Recent Results (from the past 240 hour(s))  Culture, blood (routine x 2)     Status: None (Preliminary result)   Collection Time: 10/05/21 12:52 PM   Specimen: BLOOD  Result Value Ref Range Status   Specimen Description BLOOD RIGHT FA  Final   Special Requests   Final    BOTTLES DRAWN AEROBIC AND ANAEROBIC Blood Culture adequate volume   Culture  Setup Time   Final    Organism ID to follow IN BOTH AEROBIC AND ANAEROBIC BOTTLES GRAM POSITIVE COCCI CRITICAL RESULT CALLED TO, READ BACK BY AND VERIFIED WITHLloyd Huger @ 1025 10/06/21 lfd Performed at [facility], [address] [place], [place] [address]    Culture GRAM POSITIVE COCCI  Final   Report Status PENDING  Incomplete  Culture, blood (routine x 2)     Status: None (Preliminary result)   Collection Time: 10/05/21 12:52 PM   Specimen: BLOOD  Result Value Ref Range Status   Specimen  Description BLOOD RIGHT AC  Final   Special Requests   Final    BOTTLES DRAWN AEROBIC AND ANAEROBIC Blood Culture adequate volume   Culture  Setup Time   Final    IN BOTH AEROBIC AND ANAEROBIC BOTTLES GRAM POSITIVE COCCI CRITICAL RESULT CALLED TO, READ BACK BY AND VERIFIED WITHLloyd Huger @ 8242 10/06/21 lfd Performed at [facility], [address] [place],  [address]    Culture GRAM POSITIVE COCCI  Final   Report Status PENDING  Incomplete  Resp Panel by RT-PCR (Flu A&B, Covid) Nasopharyngeal Swab     Status: None   Collection Time: 10/05/21 12:52 PM   Specimen: Nasopharyngeal Swab; Nasopharyngeal(NP) swabs in vial transport medium  Result Value Ref Range Status   SARS Coronavirus 2 by RT PCR NEGATIVE NEGATIVE Final    Comment: (NOTE) SARS-CoV-2 target  nucleic acids are NOT DETECTED.  The SARS-CoV-2 RNA is generally detectable in upper respiratory specimens during the acute phase of infection. The lowest concentration of SARS-CoV-2 viral copies this assay can detect is 138 copies/mL. A negative result does not preclude SARS-Cov-2 infection and should not be used as the sole basis for treatment or other patient management decisions. A negative result may occur with  improper specimen collection/handling, submission of specimen other than nasopharyngeal swab, presence of viral mutation(s) within the areas targeted by this assay, and inadequate number of viral copies(<138 copies/mL). A negative result must be combined with clinical observations, patient history, and epidemiological information. The expected result is Negative.  Fact Sheet for Patients:  EntrepreneurPulse.com.au  Fact Sheet for Healthcare Providers:  IncredibleEmployment.be  This test is no t yet approved or cleared by the [place] FDA and  has been authorized for detection and/or diagnosis of SARS-CoV-2 by FDA under an Emergency Use Authorization  (EUA). This EUA will remain  in effect (meaning this test can be used) for the duration of the COVID-19 declaration under Section 564(b)(1) of the Act, 21 U.S.C.section 360bbb-3(b)(1), unless the authorization is terminated  or revoked sooner.       Influenza A by PCR NEGATIVE NEGATIVE Final   Influenza B by PCR NEGATIVE NEGATIVE Final    Comment: (NOTE) The Xpert Xpress SARS-CoV-2/FLU/RSV plus assay is intended as an aid in the diagnosis of influenza from Nasopharyngeal swab specimens and should not be used as a sole basis for treatment. Nasal washings and aspirates are unacceptable for Xpert Xpress SARS-CoV-2/FLU/RSV testing.  Fact Sheet for Patients: EntrepreneurPulse.com.au  Fact Sheet for Healthcare Providers: IncredibleEmployment.be  This test is not yet approved or cleared by the [place] FDA and has been authorized for detection and/or diagnosis of SARS-CoV-2 by FDA under an Emergency Use Authorization (EUA). This EUA will remain in effect (meaning this test can be used) for the duration of the COVID-19 declaration under Section 564(b)(1) of the Act, 21 U.S.C. section 360bbb-3(b)(1), unless the authorization is terminated or revoked.  Performed at [facility], [address] [place], [place] [address]   Blood Culture ID Panel (Reflexed)     Status: Abnormal   Collection Time: 10/05/21 12:52 PM  Result Value Ref Range Status   Enterococcus faecalis NOT DETECTED NOT DETECTED Final   Enterococcus Faecium NOT DETECTED NOT DETECTED Final   Listeria monocytogenes NOT DETECTED NOT DETECTED Final   Staphylococcus species DETECTED (A) NOT DETECTED Final    Comment: CRITICAL RESULT CALLED TO, READ BACK BY AND VERIFIED WITH: NATHAN BELUE @ 4259 10/06/21 LFD    Staphylococcus aureus (BCID) DETECTED (A) NOT DETECTED Final    Comment: Methicillin (oxacillin)-resistant Staphylococcus aureus (MRSA). MRSA is predictably resistant  to beta-lactam antibiotics (except ceftaroline). Preferred therapy is vancomycin unless clinically contraindicated. Patient requires contact precautions if  hospitalized. CRITICAL RESULT CALLED TO, READ BACK BY AND VERIFIED WITH: NATHAN BELUE @ 5638 10/06/21 LFD    Staphylococcus epidermidis NOT DETECTED NOT DETECTED Final   Staphylococcus lugdunensis NOT DETECTED NOT DETECTED Final   Streptococcus species NOT DETECTED NOT DETECTED Final   Streptococcus agalactiae NOT DETECTED NOT DETECTED Final   Streptococcus pneumoniae NOT DETECTED NOT DETECTED Final   Streptococcus pyogenes NOT DETECTED NOT DETECTED Final   A.calcoaceticus-baumannii NOT DETECTED NOT DETECTED Final   Bacteroides fragilis NOT DETECTED NOT DETECTED Final   Enterobacterales NOT DETECTED NOT DETECTED Final   Enterobacter cloacae complex NOT DETECTED NOT DETECTED Final   Escherichia  coli NOT DETECTED NOT DETECTED Final   Klebsiella aerogenes NOT DETECTED NOT DETECTED Final   Klebsiella oxytoca NOT DETECTED NOT DETECTED Final   Klebsiella pneumoniae NOT DETECTED NOT DETECTED Final   Proteus species NOT DETECTED NOT DETECTED Final   Salmonella species NOT DETECTED NOT DETECTED Final   Serratia marcescens NOT DETECTED NOT DETECTED Final   Haemophilus influenzae NOT DETECTED NOT DETECTED Final   Neisseria meningitidis NOT DETECTED NOT DETECTED Final   Pseudomonas aeruginosa NOT DETECTED NOT DETECTED Final   Stenotrophomonas maltophilia NOT DETECTED NOT DETECTED Final   Candida albicans NOT DETECTED NOT DETECTED Final   Candida auris NOT DETECTED NOT DETECTED Final   Candida glabrata NOT DETECTED NOT DETECTED Final   Candida krusei NOT DETECTED NOT DETECTED Final   Candida parapsilosis NOT DETECTED NOT DETECTED Final   Candida tropicalis NOT DETECTED NOT DETECTED Final   Cryptococcus neoformans/gattii NOT DETECTED NOT DETECTED Final   Meth resistant mecA/C and MREJ DETECTED (A) NOT DETECTED Final    Comment: CRITICAL  RESULT CALLED TO, READ BACK BY AND VERIFIED WITHLloyd Huger @ 2800 10/06/21 LFD Performed at [facility], [address] [place], [place] [address]   MRSA Next Gen by PCR, Nasal     Status: Abnormal   Collection Time: 10/05/21  3:53 PM   Specimen: Nasal Mucosa; Nasal Swab  Result Value Ref Range Status   MRSA by PCR Next Gen DETECTED (A) NOT DETECTED Final    Comment: CRITICAL RESULT CALLED TO, READ BACK BY AND VERIFIED WITH: MYRA FLOWERS 10/05/21 @ 1735 BY SB (NOTE) The GeneXpert MRSA Assay (FDA approved for NASAL specimens only), is one component of a comprehensive MRSA colonization surveillance program. It is not intended to diagnose MRSA infection nor to guide or monitor treatment for MRSA infections. Test performance is not FDA approved in patients less than 64 years old. Performed at [facility], [address] [place], [place] [address]     IMAGING RESULTS:  I have personally reviewed the films ?Two small indistinct nodular opacities in the right lung. Chest CT with IV contrast recommended to evaluate for pulmonary nodules.   Impression/Recommendation DKA on presentation.  Improving  Encephalopathy secondary to above  MRSA bacteremia.  Source unclear.  No pneumonia No skin lesions Has had traumatic Foley placement nearly 20 days ago and has had intermittent catheterization followed by Foley again.  So need to rule out a urinary source. Has thoracic back pain need to rule out thoracolumbar discitis. Recommend repeat blood cultures 2D echo has been done.  May need TEE MRI of the thoracic and lumbar spine. Agree with this continuing cefepime  While on Vanco watch closely the levels and creatinine.  AKI on presentation secondary to dehydration has resolved.  Recent history of urinary retention enlarged bladder holding 1700 cc on 09/17/2021.  Has had Foley catheter followed by intermittent catheterization and then back on Foley.  CLL was  diagnosed in 2017 and has been on on ibrutinib.  Stable followed by Dr. Grayland Ormond.   ? 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 Grayland Ormond. ___________________________________________________ Discussed with patient, requesting provider Dr. Ola Spurr will follow this patient from tomorrow.  Note:  This document was prepared using Dragon voice recognition software and may include unintentional dictation errors.

## 2021-10-06 NOTE — Progress Notes (Signed)
PHARMACY - PHYSICIAN COMMUNICATION CRITICAL VALUE ALERT - BLOOD CULTURE IDENTIFICATION (BCID)  BCID results:  4 of 4 growing MRSA.  Pt currently on Vanc, Cefepime, and Flagyl.  Name of physician contacted: Argie Ramming, MD  Changes to prescribed antibiotics required: Stop Flagyl, continue Vancomycin and Cefepime at this time.  Renda Rolls, PharmD, MBA 10/06/2021 1:13 AM

## 2021-10-06 NOTE — Plan of Care (Signed)
  Problem: Education: Goal: Knowledge of General Education information will improve Description: Including pain rating scale, medication(s)/side effects and non-pharmacologic comfort measures Outcome: Progressing   Problem: Clinical Measurements: Goal: Respiratory complications will improve 10/06/2021 0225 by Verlin Dike, RN Outcome: Progressing 10/06/2021 0220 by Verlin Dike, RN Outcome: Not Progressing   Problem: Elimination: Goal: Will not experience complications related to bowel motility 10/06/2021 0225 by Verlin Dike, RN Outcome: Progressing 10/06/2021 0220 by Verlin Dike, RN Outcome: Not Progressing   Problem: Safety: Goal: Ability to remain free from injury will improve Outcome: Progressing   Problem: Clinical Measurements: Goal: Ability to maintain clinical measurements within normal limits will improve Outcome: Not Progressing   Problem: Clinical Measurements: Goal: Will remain free from infection Outcome: Not Progressing   Problem: Activity: Goal: Risk for activity intolerance will decrease Outcome: Not Progressing   Problem: Nutrition: Goal: Adequate nutrition will be maintained Outcome: Not Progressing   Problem: Pain Managment: Goal: General experience of comfort will improve Outcome: Not Progressing   Problem: Skin Integrity: Goal: Risk for impaired skin integrity will decrease Outcome: Not Progressing

## 2021-10-06 NOTE — Progress Notes (Signed)
Initial Nutrition Assessment  DOCUMENTATION CODES:   Non-severe (moderate) malnutrition in context of chronic illness  INTERVENTION:   Nepro Shake po TID, each supplement provides 425 kcal and 19 grams protein  MVI po daily   Pt at high refeed risk; recommend monitor potassium, magnesium and phosphorus labs daily until stable  NUTRITION DIAGNOSIS:   Moderate Malnutrition related to chronic illness (CLL, DM) as evidenced by moderate fat depletion, moderate muscle depletion.  GOAL:   Patient will meet greater than or equal to 90% of their needs  MONITOR:   PO intake, Supplement acceptance, Labs, Weight trends, Skin, I & O's  REASON FOR ASSESSMENT:   Malnutrition Screening Tool    ASSESSMENT:   70 y.o. male with medical history significant for CLL, non-insulin-dependent diabetes mellitus and depression/anxiety who is admitted with DKA and MRSA bacteremia. Pt also noted to have pulmonary nodules.  Met with pt and pt's wife in room today. Pt reports poor appetite and oral intake for the past week. Pt reports that he has been drinking Glucerna at home. Pt reports eating applesauce for breakfast this morning. Per chart, pt is down 15lbs(8%) over the past month; this is significant weight loss. RD discussed with pt the importance of adequate nutrition needed to preserve lean muscle. Pt would like to have Nepro in hospital. RD will add supplements and MVI to help pt meet his estimated needs. Pt is likely at refeed risk.   Medications reviewed and include: celexa, heparin, insulin, MVI, cefepime, LRS w/ 5% dextrose @125ml /hr, vancomycin   Labs reviewed: Na 134(L), BUN 31(H) Wbc- 22.0(H) Cbgs- 152, 127, 137, 158, 204 x 24 hrs  NUTRITION - FOCUSED PHYSICAL EXAM:  Flowsheet Row Most Recent Value  Orbital Region Moderate depletion  Upper Arm Region Severe depletion  Thoracic and Lumbar Region Moderate depletion  Buccal Region Moderate depletion  Temple Region Moderate depletion   Clavicle Bone Region Moderate depletion  Clavicle and Acromion Bone Region Moderate depletion  Scapular Bone Region Moderate depletion  Dorsal Hand Severe depletion  Patellar Region Moderate depletion  Anterior Thigh Region Moderate depletion  Posterior Calf Region Moderate depletion  Edema (RD Assessment) None  Hair Reviewed  Eyes Reviewed  Mouth Reviewed  Skin Reviewed  Nails Reviewed   Diet Order:   Diet Order             Diet Carb Modified Fluid consistency: Thin; Room service appropriate? Yes  Diet effective now                  EDUCATION NEEDS:   Education needs have been addressed  Skin:  Skin Assessment: Reviewed RN Assessment  Last BM:  10/7  Height:   Ht Readings from Last 1 Encounters:  10/05/21 5' 11"  (1.803 m)    Weight:   Wt Readings from Last 1 Encounters:  10/05/21 82.1 kg    Ideal Body Weight:  78 kg  BMI:  Body mass index is 25.24 kg/m.  Estimated Nutritional Needs:   Kcal:  2000-2300kcal/day  Protein:  100-115g/day  Fluid:  2.0-2.3L/day  Koleen Distance MS, RD, LDN Please refer to [facility] for RD and/or RD on-call/weekend/after hours pager

## 2021-10-06 NOTE — Progress Notes (Addendum)
Patient has transitioned off IV insulin. Basal insulin given and insulin gtt discontinued per order.

## 2021-10-06 NOTE — Progress Notes (Signed)
Dr. Sidney Ace notified that endotool suggest to transition IV insulin off. See new orders.

## 2021-10-06 NOTE — Plan of Care (Signed)
A&O patient, from home admitted to ICU 15 with DKA and sepsis. Patient presented with with c/o nausea/vomiting/weakness and elevated glucose. Insulin infusing per endo tool. Patient has a history of chronic urinary retention, foley catheter was placed on 10/05/2021.

## 2021-10-06 NOTE — Progress Notes (Signed)
*  PRELIMINARY RESULTS* Echocardiogram 2D Echocardiogram has been performed.  Travis [name] 10/06/2021, 11:15 AM

## 2021-10-06 NOTE — Progress Notes (Signed)
PROGRESS NOTE    Travis [name].  JIR:[id number] DOB: Feb 13, 1951 DOA: 10/05/2021 PCP: Adin Hector, MD   Brief Narrative: Taken from H&P.  Travis [name]. is a 70 y.o. male with medical history significant for CLL, non-insulin-dependent diabetes mellitus, depression/anxiety, who presents to the emergency department for chief concerns of weakness, tiredness, and shortness of breath.   He reports the weakness, fatigue, shortness of breath started about 3-4 days ago.   He endorses midback pain that started about 3-4 days ago. He endorses the pain is 9/10 and prevents him from sleeping.  He denies trauma to his person.  He endorses generalized muscle aches.   Patient initially presented to ED on September 21 with urinary retention and AKI, he drained [name] than 1700 cc on in and out catheter, he was discharged with supplies for in and out catheter and advised to follow-up with urology. He again presented on 10/2 and size 16 Foley was placed.  He had a traumatic insertion and developed gross hematuria which lasted few days afterwards.  He had an upcoming urology appointment in the next couple of days for cystoscopy which needs to be rescheduled as he is in the hospital.  On presentation patient was found to have marked leukocytosis and hyperglycemia.  Other labs consistent with diabetic ketoacidosis, lactic acid elevated at 2.5 and she was admitted for concern of sepsis and DKA.  4 out of 4 blood cultures growing MRSA.  MRSA PCR from nasal swab is positive. Patient does not have any hardware or other obvious injuries except traumatic Foley insertion approximately 8-10 days ago.  COVID and flu PCR negative.  Initially received broad-spectrum antibiotics with cefepime, vancomycin and metronidazole per sepsis protocol.  Patient also received insulin infusion for DKA which was discontinued earlier this morning. After having positive blood cultures Flagyl was initially discontinued.  I  discontinued the cefepime. Ordered echocardiogram and he will need a repeat blood culture tomorrow. ID was also consulted.  For increase lethargy brain imaging were obtained.  CT head and MRI brain was negative for any acute changes.  Subjective: Patient was seen and examined during morning rounds.  He was still feeling weak and tired.  Denies any open wounds or hardware.  Had a recent traumatic Foley catheter insertion due to obstructive uropathy and was supposed to get cystoscopy from urology this upcoming Wednesday. Wife at bedside.  Assessment & Plan:   Principal Problem:   Severe sepsis with acute organ dysfunction (HCC) Active Problems:   CLL (chronic lymphocytic leukemia) (HCC)   Varicose veins of bilateral lower extremities with pain   Diabetes mellitus type 2, uncomplicated (HCC)   Neuropathy associated with cancer (HCC)   Vitamin D deficiency   Hyperglycemia   AKI (acute kidney injury) ([place])   Hyperosmolar hyponatremia   Nodule of right lung   Malnutrition of moderate degree  Sepsis secondary to MRSA bacteremia.  All 4 bottles with MRSA.  MRSA nasal swab positive. No hardware or other obvious injuries.  History of traumatic Foley insertion recently urine cultures pending.  Leukocytosis improving, worsening procalcitonin.1.07> 1.37 ID consult -Obtain echocardiogram-if negative might need TEE -Discontinue cefepime -Continue with vancomycin -Will repeat blood cultures tomorrow  Diabetes mellitus with DKA.  Initially received insulin infusion which has been discontinued now. CBGs still elevated. -Increase the dose of Semglee to twice daily. -Add 5 units of mealtime coverage. -Continue with resistant sliding scale.  History of CLL.  Follow-up with oncology and is on ibrutinib.  Elevated uric acid. -Continue with ibrutinib. -Outpatient follow-up with oncology  History of obstructive uropathy.  AKI has been resolved.  Patient will follow up with urology as an  outpatient. -Continue with Foley catheter.  History of depression anxiety. -Continue home dose of citalopram  Objective: Vitals:   10/06/21 0900 10/06/21 1000 10/06/21 1100 10/06/21 1200  BP: 122/69 127/72 133/79 135/72  Pulse: 97 92 90 88  Resp: 18 18 17 17   Temp:      TempSrc:      SpO2: 95% 96% 98% 100%  Weight:      Height:        Intake/Output Summary (Last 24 hours) at 10/06/2021 1356 Last data filed at 10/06/2021 1200 Gross per 24 hour  Intake 6183.32 ml  Output 1225 ml  Net 4958.32 ml   Filed Weights   10/05/21 1127 10/05/21 1552  Weight: 83.9 kg 82.1 kg    Examination:  General exam: Appears calm and comfortable  Respiratory system: Clear to auscultation. Respiratory effort normal. Cardiovascular system: S1 & S2 heard, RRR.  Gastrointestinal system: Soft, nontender, nondistended, bowel sounds positive. Central nervous system: Alert and oriented. No focal neurological deficits.Symmetric 5 x 5 power. Extremities: No edema, no cyanosis, pulses intact and symmetrical. Skin: No rashes, lesions or ulcers Psychiatry: Judgement and insight appear normal.    DVT prophylaxis: Heparin Code Status: Full Family Communication: Discussed with wife at bedside Disposition Plan:  Status is: Inpatient  Remains inpatient appropriate because:Inpatient level of care appropriate due to severity of illness  Dispo: The patient is from: Home              Anticipated d/c is to: Home              Patient currently is not medically stable to d/c.   Difficult to place patient No               Level of care: Stepdown  All the records are reviewed and case discussed with Care Management/Social Worker. Management plans discussed with the patient, nursing and they are in agreement.  Consultants:  ID  Procedures:  Antimicrobials:  Vancomycin  Data Reviewed: I have personally reviewed following labs and imaging studies  CBC: Recent Labs  Lab 10/05/21 1130 10/05/21 1252  10/06/21 0546  WBC 38.9* 32.7* 22.0*  NEUTROABS  --  27.5*  --   HGB 15.7 14.7 12.2*  HCT 47.1 43.2 35.4*  MCV 82.2 84.2 79.0*  PLT 416* 326 993   Basic Metabolic Panel: Recent Labs  Lab 10/05/21 1130 10/05/21 1452 10/05/21 1632 10/05/21 2035 10/06/21 0044 10/06/21 0546  NA 128*  --  132* 131* 130* 134*  K 4.9  --  4.2 4.0 3.5 3.8  CL 96*  --  105 108 107 111  CO2 12*  --  16* 17* 15* 18*  GLUCOSE 511*  --  406* 202* 212* 149*  BUN 42*  --  38* 34* 34* 31*  CREATININE 1.68*  --  1.36* 0.98 1.01 0.92  CALCIUM 9.2  --  8.2* 8.2* 8.0* 8.2*  PHOS  --  5.3*  --   --   --   --    GFR: Estimated Creatinine Clearance: 80.7 mL/min (by C-G formula based on SCr of 0.92 mg/dL). Liver Function Tests: Recent Labs  Lab 10/05/21 1130  AST 19  ALT 15  ALKPHOS 137*  BILITOT 1.5*  PROT 6.2*  ALBUMIN 2.5*   No results for input(s): LIPASE, AMYLASE in the  last 168 hours. No results for input(s): AMMONIA in the last 168 hours. Coagulation Profile: No results for input(s): INR, PROTIME in the last 168 hours. Cardiac Enzymes: Recent Labs  Lab 10/05/21 2229  CKTOTAL 18*   BNP (last 3 results) No results for input(s): PROBNP in the last 8760 hours. HbA1C: No results for input(s): HGBA1C in the last 72 hours. CBG: Recent Labs  Lab 10/06/21 0330 10/06/21 0425 10/06/21 0537 10/06/21 0748 10/06/21 1137  GLUCAP 152* 127* 137* 158* 204*   Lipid Profile: No results for input(s): CHOL, HDL, LDLCALC, TRIG, CHOLHDL, LDLDIRECT in the last 72 hours. Thyroid Function Tests: No results for input(s): TSH, T4TOTAL, FREET4, T3FREE, THYROIDAB in the last 72 hours. Anemia Panel: No results for input(s): VITAMINB12, FOLATE, FERRITIN, TIBC, IRON, RETICCTPCT in the last 72 hours. Sepsis Labs: Recent Labs  Lab 10/05/21 1252 10/05/21 1632 10/06/21 0546  PROCALCITON  --  1.07 1.37  LATICACIDVEN 2.9* 2.2*  --     Recent Results (from the past 240 hour(s))  Culture, blood (routine x 2)      Status: None (Preliminary result)   Collection Time: 10/05/21 12:52 PM   Specimen: BLOOD  Result Value Ref Range Status   Specimen Description BLOOD RIGHT FA  Final   Special Requests   Final    BOTTLES DRAWN AEROBIC AND ANAEROBIC Blood Culture adequate volume   Culture  Setup Time   Final    Organism ID to follow IN BOTH AEROBIC AND ANAEROBIC BOTTLES GRAM POSITIVE COCCI CRITICAL RESULT CALLED TO, READ BACK BY AND VERIFIED WITHLloyd Huger @ 9935 10/06/21 lfd Performed at [facility], [address], [place], [place] [address]    Culture GRAM POSITIVE COCCI  Final   Report Status PENDING  Incomplete  Culture, blood (routine x 2)     Status: None (Preliminary result)   Collection Time: 10/05/21 12:52 PM   Specimen: BLOOD  Result Value Ref Range Status   Specimen Description BLOOD RIGHT AC  Final   Special Requests   Final    BOTTLES DRAWN AEROBIC AND ANAEROBIC Blood Culture adequate volume   Culture  Setup Time   Final    IN BOTH AEROBIC AND ANAEROBIC BOTTLES GRAM POSITIVE COCCI CRITICAL RESULT CALLED TO, READ BACK BY AND VERIFIED WITH: Lloyd Huger @ 9390 10/06/21 lfd Performed at [facility], [address], [place], [place] [address]    Culture GRAM POSITIVE COCCI  Final   Report Status PENDING  Incomplete  Resp Panel by RT-PCR (Flu A&B, Covid) Nasopharyngeal Swab     Status: None   Collection Time: 10/05/21 12:52 PM   Specimen: Nasopharyngeal Swab; Nasopharyngeal(NP) swabs in vial transport medium  Result Value Ref Range Status   SARS Coronavirus 2 by RT PCR NEGATIVE NEGATIVE Final    Comment: (NOTE) SARS-CoV-2 target nucleic acids are NOT DETECTED.  The SARS-CoV-2 RNA is generally detectable in upper respiratory specimens during the acute phase of infection. The lowest concentration of SARS-CoV-2 viral copies this assay can detect is 138 copies/mL. A negative result does not preclude SARS-Cov-2 infection and should not be used as the sole  basis for treatment or other patient management decisions. A negative result may occur with  improper specimen collection/handling, submission of specimen other than nasopharyngeal swab, presence of viral mutation(s) within the areas targeted by this assay, and inadequate number of viral copies(<138 copies/mL). A negative result must be combined with clinical observations, patient history, and epidemiological information. The expected result is  Negative.  Fact Sheet for Patients:  EntrepreneurPulse.com.au  Fact Sheet for Healthcare Providers:  IncredibleEmployment.be  This test is no t yet approved or cleared by the [place] FDA and  has been authorized for detection and/or diagnosis of SARS-CoV-2 by FDA under an Emergency Use Authorization (EUA). This EUA will remain  in effect (meaning this test can be used) for the duration of the COVID-19 declaration under Section 564(b)(1) of the Act, 21 U.S.C.section 360bbb-3(b)(1), unless the authorization is terminated  or revoked sooner.       Influenza A by PCR NEGATIVE NEGATIVE Final   Influenza B by PCR NEGATIVE NEGATIVE Final    Comment: (NOTE) The Xpert Xpress SARS-CoV-2/FLU/RSV plus assay is intended as an aid in the diagnosis of influenza from Nasopharyngeal swab specimens and should not be used as a sole basis for treatment. Nasal washings and aspirates are unacceptable for Xpert Xpress SARS-CoV-2/FLU/RSV testing.  Fact Sheet for Patients: EntrepreneurPulse.com.au  Fact Sheet for Healthcare Providers: IncredibleEmployment.be  This test is not yet approved or cleared by the [place] FDA and has been authorized for detection and/or diagnosis of SARS-CoV-2 by FDA under an Emergency Use Authorization (EUA). This EUA will remain in effect (meaning this test can be used) for the duration of the COVID-19 declaration under Section 564(b)(1) of the Act,  21 U.S.C. section 360bbb-3(b)(1), unless the authorization is terminated or revoked.  Performed at [facility], [address] [place], [place] [address]   Blood Culture ID Panel (Reflexed)     Status: Abnormal   Collection Time: 10/05/21 12:52 PM  Result Value Ref Range Status   Enterococcus faecalis NOT DETECTED NOT DETECTED Final   Enterococcus Faecium NOT DETECTED NOT DETECTED Final   Listeria monocytogenes NOT DETECTED NOT DETECTED Final   Staphylococcus species DETECTED (A) NOT DETECTED Final    Comment: CRITICAL RESULT CALLED TO, READ BACK BY AND VERIFIED WITH: NATHAN BELUE @ 1062 10/06/21 LFD    Staphylococcus aureus (BCID) DETECTED (A) NOT DETECTED Final    Comment: Methicillin (oxacillin)-resistant Staphylococcus aureus (MRSA). MRSA is predictably resistant to beta-lactam antibiotics (except ceftaroline). Preferred therapy is vancomycin unless clinically contraindicated. Patient requires contact precautions if  hospitalized. CRITICAL RESULT CALLED TO, READ BACK BY AND VERIFIED WITH: NATHAN BELUE @ 6948 10/06/21 LFD    Staphylococcus epidermidis NOT DETECTED NOT DETECTED Final   Staphylococcus lugdunensis NOT DETECTED NOT DETECTED Final   Streptococcus species NOT DETECTED NOT DETECTED Final   Streptococcus agalactiae NOT DETECTED NOT DETECTED Final   Streptococcus pneumoniae NOT DETECTED NOT DETECTED Final   Streptococcus pyogenes NOT DETECTED NOT DETECTED Final   A.calcoaceticus-baumannii NOT DETECTED NOT DETECTED Final   Bacteroides fragilis NOT DETECTED NOT DETECTED Final   Enterobacterales NOT DETECTED NOT DETECTED Final   Enterobacter cloacae complex NOT DETECTED NOT DETECTED Final   Escherichia coli NOT DETECTED NOT DETECTED Final   Klebsiella aerogenes NOT DETECTED NOT DETECTED Final   Klebsiella oxytoca NOT DETECTED NOT DETECTED Final   Klebsiella pneumoniae NOT DETECTED NOT DETECTED Final   Proteus species NOT DETECTED NOT DETECTED Final    Salmonella species NOT DETECTED NOT DETECTED Final   Serratia marcescens NOT DETECTED NOT DETECTED Final   Haemophilus influenzae NOT DETECTED NOT DETECTED Final   Neisseria meningitidis NOT DETECTED NOT DETECTED Final   Pseudomonas aeruginosa NOT DETECTED NOT DETECTED Final   Stenotrophomonas maltophilia NOT DETECTED NOT DETECTED Final   Candida albicans NOT DETECTED NOT DETECTED Final   Candida auris NOT DETECTED NOT DETECTED  Final   Candida glabrata NOT DETECTED NOT DETECTED Final   Candida krusei NOT DETECTED NOT DETECTED Final   Candida parapsilosis NOT DETECTED NOT DETECTED Final   Candida tropicalis NOT DETECTED NOT DETECTED Final   Cryptococcus neoformans/gattii NOT DETECTED NOT DETECTED Final   Meth resistant mecA/C and MREJ DETECTED (A) NOT DETECTED Final    Comment: CRITICAL RESULT CALLED TO, READ BACK BY AND VERIFIED WITHLloyd Huger @ 0086 10/06/21 LFD Performed at [facility], [address], [place], [place] [address]   MRSA Next Gen by PCR, Nasal     Status: Abnormal   Collection Time: 10/05/21  3:53 PM   Specimen: Nasal Mucosa; Nasal Swab  Result Value Ref Range Status   MRSA by PCR Next Gen DETECTED (A) NOT DETECTED Final    Comment: CRITICAL RESULT CALLED TO, READ BACK BY AND VERIFIED WITH: MYRA FLOWERS 10/05/21 @ 1735 BY SB (NOTE) The GeneXpert MRSA Assay (FDA approved for NASAL specimens only), is one component of a comprehensive MRSA colonization surveillance program. It is not intended to diagnose MRSA infection nor to guide or monitor treatment for MRSA infections. Test performance is not FDA approved in patients less than 69 years old. Performed at [facility], [address] [place], [place] [address]      Radiology Studies: CT Head Wo Contrast  Result Date: 10/05/2021 CLINICAL DATA:  Increasing weakness since Friday EXAM: CT HEAD WITHOUT CONTRAST TECHNIQUE: Contiguous axial images were obtained from the base of the skull  through the vertex without intravenous contrast. COMPARISON:  None. FINDINGS: Brain: No evidence of acute infarction, hemorrhage, hydrocephalus, extra-axial collection or mass lesion/mass effect. Vascular: No hyperdense vessel or unexpected calcification. Skull: No osseous abnormality. Sinuses/Orbits: Visualized paranasal sinuses are clear. Visualized mastoid sinuses are clear. Visualized orbits demonstrate no focal abnormality. Other: None IMPRESSION: No acute intracranial pathology. Electronically Signed   By: Kathreen Devoid M.D.   On: 10/05/2021 13:48   MR BRAIN WO CONTRAST  Result Date: 10/06/2021 CLINICAL DATA:  Acute neurologic deficit EXAM: MRI HEAD WITHOUT CONTRAST TECHNIQUE: Multiplanar, multiecho pulse sequences of the brain and surrounding structures were obtained without intravenous contrast. COMPARISON:  Head CT 10/05/2021 FINDINGS: Brain: No acute infarct, mass effect or extra-axial collection. No acute or chronic hemorrhage. There is multifocal hyperintense T2-weighted signal within the white matter. Parenchymal volume and CSF spaces are normal. 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 The midline structures are normal. Vascular: Major flow voids are preserved. Skull and upper cervical spine: Normal calvarium and skull base. Visualized upper cervical spine and soft tissues are normal. Sinuses/Orbits:No paranasal sinus fluid levels or advanced mucosal thickening. No mastoid or middle ear effusion. Normal orbits. IMPRESSION: 1. No acute intracranial abnormality. 2. 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 Electronically Signed   By: Ulyses Jarred M.D.   On: 10/06/2021 00:28   DG Chest Portable 1 View  Result Date: 10/05/2021 CLINICAL DATA:  Weakness EXAM: PORTABLE CHEST 1 VIEW COMPARISON:  None. FINDINGS: Normal heart size. Normal mediastinal contour. No pneumothorax. No pleural effusion. Two small indistinct nodular opacities in the  right lung measuring 7 mm in the upper right lung and 8 mm in the lower right lung. Clear left lung. IMPRESSION: Two small indistinct nodular opacities in the right lung. Chest CT with IV contrast recommended to evaluate for pulmonary nodules. Electronically Signed   By: Ilona Sorrel M.D.   On: 10/05/2021 13:27    Scheduled Meds:  Chlorhexidine  Gluconate Cloth  6 each Topical Q0600   Chlorhexidine Gluconate Cloth  6 each Topical Q0600   citalopram  40 mg Oral Daily   feeding supplement (NEPRO CARB STEADY)  237 mL Oral TID BM   heparin  5,000 Units Subcutaneous Q8H   ibrutinib  420 mg Oral Daily   insulin aspart  0-20 Units Subcutaneous TID AC & HS   insulin glargine-yfgn  10 Units Subcutaneous Daily   [START ON 10/07/2021] multivitamin with minerals  1 tablet Oral Daily   mupirocin ointment  1 application Nasal BID   pneumococcal 23 valent vaccine  0.5 mL Intramuscular Tomorrow-1000   Continuous Infusions:  ceFEPime (MAXIPIME) IV Stopped (10/06/21 1157)   dextrose 5% lactated ringers Stopped (10/06/21 0620)   lactated ringers 125 mL/hr at 10/05/21 1457   vancomycin       LOS: 1 day   Time spent: 45 minutes. [name] than 50% of the time was spent in counseling/coordination of care  Lorella Nimrod, MD [facility]  If 7PM-7AM, please contact night-coverage [email or web address]  10/06/2021, 1:56 PM   This record has been created using Systems analyst. Errors have been sought and corrected,but may not always be located. Such creation errors do not reflect on the standard of care.

## 2021-10-06 NOTE — Progress Notes (Signed)
Pharmacy Antibiotic Note  Travis [name]. is a 70 y.o. male admitted on 10/05/2021 with sepsis.  Patient recently experiencing massive urinary retention per Urology notes.  He has required placement of foley catheter or intermittent self-catheterization. Pharmacy has been consulted for vancomycin and cefepime dosing. 10/9 Blood cultures returned with GPC in 4/4 bottles, BCID = MRSE  Today, 10/06/2021 Day #1 vancomycin  Afebrile WBC: elevated but improved to 22 Renal: SCr has improved to 0.92 10/9 Blood culture with 4/4 GPC, BCID = MRSA TTE performed today, result pending  Plan: Based on improvement in SCr, adjust vancomycin to 1gm IV q12h AUC = 474.5 using SCr 0.92 mg/dl (Vd 0.72 L/kg) AUC goal 400-600 Follow-up TTE result Follow-up timing of repeat blood cultures SCr in am  Monitor renal function, clinical course, and length or therapy and possible need for OPAT Follow-up ID recommendations   Height: 5\' 11"  (180.3 cm) Weight: 82.1 kg (181 lb) IBW/kg (Calculated) : 75.3  Temp (24hrs), Avg:97.6 F (36.4 C), Min:97.4 F (36.3 C), Max:98 F (36.7 C)  Recent Labs  Lab 10/05/21 1130 10/05/21 1252 10/05/21 1632 10/05/21 2035 10/06/21 0044 10/06/21 0546  WBC 38.9* 32.7*  --   --   --  22.0*  CREATININE 1.68*  --  1.36* 0.98 1.01 0.92  LATICACIDVEN  --  2.9* 2.2*  --   --   --      Estimated Creatinine Clearance: 80.7 mL/min (by C-G formula based on SCr of 0.92 mg/dL).    No Known Allergies  Antimicrobials this admission: Vanc 10/9 >>  Cefepime 10/9 >> 10/10  Dose adjustments this admission: None  Microbiology results: 10/9 BCx: 4/4 GPC, BCID = MRSA 10/9 UCx: in process   Thank you for allowing pharmacy to be a part of this patient's care.  Doreene Eland, PharmD, BCPS.   Work Cell: [phone] 10/06/2021 2:16 PM

## 2021-10-07 LAB — ECHOCARDIOGRAM COMPLETE
AR max vel: 3.69 cm2
AV Area VTI: 4.32 cm2
AV Area mean vel: 3.75 cm2
AV Mean grad: 4 mmHg
AV Peak grad: 6.8 mmHg
Ao pk vel: 1.3 m/s
Area-P 1/2: 4.21 cm2
Height: 71 in
MV VTI: 4.57 cm2
S' Lateral: 2.9 cm
Weight: 2895.96 oz

## 2021-10-07 LAB — CBC
HCT: 32.9 % — ABNORMAL LOW (ref 39.0–52.0)
Hemoglobin: 12 g/dL — ABNORMAL LOW (ref 13.0–17.0)
MCH: 28.6 pg (ref 26.0–34.0)
MCHC: 36.5 g/dL — ABNORMAL HIGH (ref 30.0–36.0)
MCV: 78.3 fL — ABNORMAL LOW (ref 80.0–100.0)
Platelets: 233 10*3/uL (ref 150–400)
RBC: 4.2 MIL/uL — ABNORMAL LOW (ref 4.22–5.81)
RDW: 14.5 % (ref 11.5–15.5)
WBC: 15.4 10*3/uL — ABNORMAL HIGH (ref 4.0–10.5)
nRBC: 0 % (ref 0.0–0.2)

## 2021-10-07 LAB — BASIC METABOLIC PANEL
Anion gap: 11 (ref 5–15)
BUN: 29 mg/dL — ABNORMAL HIGH (ref 8–23)
CO2: 15 mmol/L — ABNORMAL LOW (ref 22–32)
Calcium: 8.2 mg/dL — ABNORMAL LOW (ref 8.9–10.3)
Chloride: 103 mmol/L (ref 98–111)
Creatinine, Ser: 0.94 mg/dL (ref 0.61–1.24)
GFR, Estimated: >60 mL/min (ref >60)
Glucose, Bld: 294 mg/dL — ABNORMAL HIGH (ref 70–99)
Potassium: 3.7 mmol/L (ref 3.5–5.1)
Sodium: 129 mmol/L — ABNORMAL LOW (ref 135–145)

## 2021-10-07 LAB — GLUCOSE, CAPILLARY
Glucose-Capillary: 293 mg/dL — ABNORMAL HIGH (ref 70–99)
Glucose-Capillary: 292 mg/dL — ABNORMAL HIGH (ref 70–99)
Glucose-Capillary: 187 mg/dL — ABNORMAL HIGH (ref 70–99)
Glucose-Capillary: 131 mg/dL — ABNORMAL HIGH (ref 70–99)

## 2021-10-07 LAB — PROCALCITONIN: Procalcitonin: 0.81 ng/mL

## 2021-10-07 MED ORDER — LACTATED RINGERS IV SOLN: INTRAVENOUS | Status: AC

## 2021-10-07 MED ORDER — INSULIN GLARGINE-YFGN 100 UNIT/ML ~~LOC~~ SOLN
15.0000 [IU] | Freq: Two times a day (BID) | SUBCUTANEOUS | Status: DC
  Administered 2021-10-07 – 2021-10-11 (×7): 15 [IU] via SUBCUTANEOUS
  Filled 2021-10-07 (×11): qty 0.15

## 2021-10-07 MED ORDER — INSULIN ASPART 100 UNIT/ML IJ SOLN
8.0000 [IU] | Freq: Three times a day (TID) | INTRAMUSCULAR | Status: DC
  Administered 2021-10-07 – 2021-10-11 (×13): 8 [IU] via SUBCUTANEOUS
  Filled 2021-10-07 (×11): qty 1

## 2021-10-07 MED ORDER — ENOXAPARIN SODIUM 40 MG/0.4ML IJ SOSY
40.0000 mg | PREFILLED_SYRINGE | INTRAMUSCULAR | Status: DC
  Administered 2021-10-07 – 2021-10-10 (×4): 40 mg via SUBCUTANEOUS
  Filled 2021-10-07 (×4): qty 0.4

## 2021-10-07 NOTE — Progress Notes (Incomplete)
   10/07/21  CC: No chief complaint on file.    HPI: Travis [name]. is a 70 y.o.male with a personal history of acute urinary retention who presents today for cystoscopy/TRUS.   Recent labs indicate acute renal failure with creatinine of 3.8, previous baseline around 1.1.  During ED visit his after Foley catheter placement, his urine became bloody. His catheter was exchanged for the 20 three-way hematuria catheter.  He ended up having CBI, 3 L which cleared his urine and they sent him home.   On 09/26/2021 office visit with Debroah Loop, PA-C, he failed voiding trial with a PVR of 515 mL. He was offered a  Foley catheter replacement versus continued self-catheterization and he elected for the latter and he was stopped on silodosin.   Recent PSA on 09/19/2021 was 4.79.     There were no vitals filed for this visit. NED. A&Ox3.   No respiratory distress   Abd soft, NT, ND Normal external genitalia with patent urethral meatus       Cystoscopy Procedure Note  Patient identification was confirmed, informed consent was obtained, and patient was prepped using Betadine solution.  Lidocaine jelly was administered per urethral meatus.    Preoperative abx where received prior to procedure.     Pre-Procedure: - Inspection reveals a normal caliber ureteral meatus.  Procedure: The flexible cystoscope was introduced without difficulty - No urethral strictures/lesions are present. - {Blank multiple:19197::"Enlarged","Surgically absent","Normal"} prostate *** - {Blank multiple:19197::"Normal","Elevated","Tight"} bladder neck - Bilateral ureteral orifices identified - Bladder mucosa  reveals no ulcers, tumors, or lesions - No bladder stones - No trabeculation  Retroflexion shows ***   Post-Procedure: - Patient tolerated the procedure well  Assessment/ Plan:    Prostate transrectal ultrasound sizing   Informed consent was obtained after discussing risks/benefits  of the procedure.  A time out was performed to ensure correct patient identity.   Pre-Procedure: -Transrectal probe was placed without difficulty -Transrectal Ultrasound performed revealing a *** gm prostate measuring *** x *** x *** cm (length) -No significant hypoechoic or median lobe noted      Assessment/ Plan:   Estill Dooms

## 2021-10-07 NOTE — Progress Notes (Addendum)
Acute PROGRESS NOTE    Travis [name].  WEX:[id number] DOB: 1951/07/17 DOA: 10/05/2021 PCP: Adin Hector, MD   Brief Narrative: Taken from H&P.  Travis [name]. is a 70 y.o. male with medical history significant for CLL, non-insulin-dependent diabetes mellitus, depression/anxiety, who presents to the emergency department for chief concerns of weakness, tiredness, and shortness of breath.   He reports the weakness, fatigue, shortness of breath started about 3-4 days ago.   He endorses midback pain that started about 3-4 days ago. He endorses the pain is 9/10 and prevents him from sleeping.  He denies trauma to his person.  He endorses generalized muscle aches.   Patient initially presented to ED on September 21 with urinary retention and AKI, he drained [name] than 1700 cc on in and out catheter, he was discharged with supplies for in and out catheter and advised to follow-up with urology. He again presented on 10/2 and size 16 Foley was placed.  He had a traumatic insertion and developed gross hematuria which lasted few days afterwards.  He had an upcoming urology appointment in the next couple of days for cystoscopy which needs to be rescheduled as he is in the hospital.  On presentation patient was found to have marked leukocytosis and hyperglycemia.  Other labs consistent with diabetic ketoacidosis, lactic acid elevated at 2.5 and she was admitted for concern of sepsis and DKA.  4 out of 4 blood cultures growing MRSA.  MRSA PCR from nasal swab is positive. Patient does not have any hardware or other obvious injuries except traumatic Foley insertion approximately 8-10 days ago.  COVID and flu PCR negative.  Initially received broad-spectrum antibiotics with cefepime, vancomycin and metronidazole per sepsis protocol.  Patient also received insulin infusion for DKA which was discontinued earlier this morning. After having positive blood cultures Flagyl was initially discontinued.  I  discontinued the cefepime. Ordered echocardiogram and he will need a repeat blood culture tomorrow. ID was also consulted.  For increase lethargy brain imaging were obtained.  CT head and MRI brain was negative for any acute changes.  10/11: MRI of the thoracic and lumbar spine was obtained due to his complaint of back pain and it was negative for any discitis or osteomyelitis.  No Epidural abscesses noticed. There was mild lower lumbar degenerative disc disease without spinal cord or neuronal foraminal stenosis.  MR thoracic spine with mild bilateral pleural effusion.  Repeat blood cultures sent today. Cardiology was consulted for TEE today, they want to have an EGD done as patient has an history of esophageal stricture and dysphagia, GI was also consulted and they might do a coordinated EGD and TEE on Thursday so patient will need only 1 sedation.  Subjective: Patient was seen and examined today.  Wife at bedside.  He thinks that he is improving, appetite improving and he was able to take good p.o. intake.  No new complaints.  Assessment & Plan:   Principal Problem:   Severe sepsis with acute organ dysfunction (HCC) Active Problems:   CLL (chronic lymphocytic leukemia) (HCC)   Varicose veins of bilateral lower extremities with pain   Diabetes mellitus type 2, uncomplicated (HCC)   Neuropathy associated with cancer (HCC)   Vitamin D deficiency   Hyperglycemia   AKI (acute kidney injury) ([place])   Hyperosmolar hyponatremia   Nodule of right lung   Malnutrition of moderate degree  Sepsis secondary to MRSA bacteremia.  All 4 bottles with MRSA.  MRSA nasal  swab positive. No hardware or other obvious injuries.  History of traumatic Foley insertion recently urine cultures pending.  Leukocytosis improving, worsening procalcitonin.1.07> 1.37 MR of thoracic and lumbar spine negative for any epidural abscess, discitis or osteomyelitis. Urine cultures with multiple species and they were  suggesting very collection but patient is already on appropriate antibiotics. Repeat blood cultures sent today.  Cefepime was discontinued yesterday ID consult is on board. -Echocardiogram within normal limit -Consulted cardiology for TEE -Continue with vancomycin -Will repeat blood cultures tomorrow  Diabetes mellitus with DKA.  Initially received insulin infusion which has been discontinued now. CBGs still elevated.  Little worsening of bicarb today but her gap remains within normal limit. -Increase the dose of Semglee to 15 units twice daily. -Increase mealtime coverage to 8 units if he eats [name] than 50% of meals. -Continue with resistant sliding scale.  None anion gap metabolic acidosis.  Might be secondary to sepsis. -Give him some fluid. -Monitor BMP  History of CLL.  Follow-up with oncology and is on ibrutinib. Elevated uric acid. -Continue with ibrutinib. -Outpatient follow-up with oncology  History of obstructive uropathy.  AKI has been resolved.  Patient will follow up with urology as an outpatient. -Continue with Foley catheter.  History of depression anxiety. -Continue home dose of citalopram  Objective: Vitals:   10/07/21 0300 10/07/21 0400 10/07/21 0500 10/07/21 0600  BP: 136/74 128/75 139/76 135/77  Pulse: 89 89 91 88  Resp: 16 17 18 17   Temp:      TempSrc:      SpO2: 97% 97% 97% 98%  Weight:      Height:        Intake/Output Summary (Last 24 hours) at 10/07/2021 0758 Last data filed at 10/07/2021 0500 Gross per 24 hour  Intake 1738.43 ml  Output 2100 ml  Net -361.57 ml    Filed Weights   10/05/21 1127 10/05/21 1552  Weight: 83.9 kg 82.1 kg    Examination:  General.  Ill-appearing gentleman, in no acute distress. Pulmonary.  Lungs clear bilaterally, normal respiratory effort. CV.  Regular rate and rhythm, no JVD, rub or murmur. Abdomen.  Soft, nontender, nondistended, BS positive. CNS.  Alert and oriented x3.  No focal neurologic  deficit. Extremities.  No edema, no cyanosis, pulses intact and symmetrical. Psychiatry.  Judgment and insight appears normal.   DVT prophylaxis: Heparin Code Status: Full Family Communication: Discussed with wife at bedside Disposition Plan:  Status is: Inpatient  Remains inpatient appropriate because:Inpatient level of care appropriate due to severity of illness  Dispo: The patient is from: Home              Anticipated d/c is to: Home              Patient currently is not medically stable to d/c.   Difficult to place patient No               Level of care: Stepdown  All the records are reviewed and case discussed with Care Management/Social Worker. Management plans discussed with the patient, nursing and they are in agreement.  Consultants:  ID  Procedures:  Antimicrobials:  Vancomycin  Data Reviewed: I have personally reviewed following labs and imaging studies  CBC: Recent Labs  Lab 10/05/21 1130 10/05/21 1252 10/06/21 0546 10/07/21 0645  WBC 38.9* 32.7* 22.0* 15.4*  NEUTROABS  --  27.5*  --   --   HGB 15.7 14.7 12.2* 12.0*  HCT 47.1 43.2 35.4* 32.9*  MCV  82.2 84.2 79.0* 78.3*  PLT 416* 326 269 267    Basic Metabolic Panel: Recent Labs  Lab 10/05/21 1452 10/05/21 1632 10/05/21 2035 10/06/21 0044 10/06/21 0546 10/07/21 0645  NA  --  132* 131* 130* 134* 129*  K  --  4.2 4.0 3.5 3.8 3.7  CL  --  105 108 107 111 103  CO2  --  16* 17* 15* 18* 15*  GLUCOSE  --  406* 202* 212* 149* 294*  BUN  --  38* 34* 34* 31* 29*  CREATININE  --  1.36* 0.98 1.01 0.92 0.94  CALCIUM  --  8.2* 8.2* 8.0* 8.2* 8.2*  PHOS 5.3*  --   --   --   --   --     GFR: Estimated Creatinine Clearance: 79 mL/min (by C-G formula based on SCr of 0.94 mg/dL). Liver Function Tests: Recent Labs  Lab 10/05/21 1130  AST 19  ALT 15  ALKPHOS 137*  BILITOT 1.5*  PROT 6.2*  ALBUMIN 2.5*    No results for input(s): LIPASE, AMYLASE in the last 168 hours. No results for input(s):  AMMONIA in the last 168 hours. Coagulation Profile: No results for input(s): INR, PROTIME in the last 168 hours. Cardiac Enzymes: Recent Labs  Lab 10/05/21 2229  CKTOTAL 18*    BNP (last 3 results) No results for input(s): PROBNP in the last 8760 hours. HbA1C: Recent Labs    10/05/21 1632 10/06/21 0546  HGBA1C 11.4* 11.4*   CBG: Recent Labs  Lab 10/06/21 0748 10/06/21 1137 10/06/21 1541 10/06/21 2345 10/07/21 0730  GLUCAP 158* 204* 332* 279* 293*    Lipid Profile: No results for input(s): CHOL, HDL, LDLCALC, TRIG, CHOLHDL, LDLDIRECT in the last 72 hours. Thyroid Function Tests: No results for input(s): TSH, T4TOTAL, FREET4, T3FREE, THYROIDAB in the last 72 hours. Anemia Panel: No results for input(s): VITAMINB12, FOLATE, FERRITIN, TIBC, IRON, RETICCTPCT in the last 72 hours. Sepsis Labs: Recent Labs  Lab 10/05/21 1252 10/05/21 1632 10/06/21 0546  PROCALCITON  --  1.07 1.37  LATICACIDVEN 2.9* 2.2*  --      Recent Results (from the past 240 hour(s))  Urine Culture     Status: Abnormal   Collection Time: 10/05/21 12:52 PM   Specimen: Urine, Clean Catch  Result Value Ref Range Status   Specimen Description   Final    URINE, CLEAN CATCH Performed at [facility], [address], [place], [place] [address]    Special Requests   Final    NONE Performed at [facility], [address] [place], [place]  [address]    Culture MULTIPLE SPECIES PRESENT, SUGGEST RECOLLECTION (A)  Final   Report Status 10/07/2021 FINAL  Final  Culture, blood (routine x 2)     Status: None (Preliminary result)   Collection Time: 10/05/21 12:52 PM   Specimen: BLOOD  Result Value Ref Range Status   Specimen Description BLOOD RIGHT FA  Final   Special Requests   Final    BOTTLES DRAWN AEROBIC AND ANAEROBIC Blood Culture adequate volume   Culture  Setup Time   Final    Organism ID to follow IN BOTH AEROBIC AND ANAEROBIC BOTTLES GRAM POSITIVE COCCI CRITICAL  RESULT CALLED TO, READ BACK BY AND VERIFIED WITHLloyd Huger @ [id number] 10/06/21 lfd Performed at [facility], [address] [place], [place] [address]    Culture [facility] POSITIVE COCCI  Final   Report Status PENDING  Incomplete  Culture, blood (routine  x 2)     Status: None (Preliminary result)   Collection Time: 10/05/21 12:52 PM   Specimen: BLOOD  Result Value Ref Range Status   Specimen Description BLOOD RIGHT AC  Final   Special Requests   Final    BOTTLES DRAWN AEROBIC AND ANAEROBIC Blood Culture adequate volume   Culture  Setup Time   Final    IN BOTH AEROBIC AND ANAEROBIC BOTTLES GRAM POSITIVE COCCI CRITICAL RESULT CALLED TO, READ BACK BY AND VERIFIED WITH: Lloyd Huger @ [id number] 10/06/21 lfd Performed at [facility], [address], [place], [place] [address]    Culture GRAM POSITIVE COCCI  Final   Report Status PENDING  Incomplete  Resp Panel by RT-PCR (Flu A&B, Covid) Nasopharyngeal Swab     Status: None   Collection Time: 10/05/21 12:52 PM   Specimen: Nasopharyngeal Swab; Nasopharyngeal(NP) swabs in vial transport medium  Result Value Ref Range Status   SARS Coronavirus 2 by RT PCR NEGATIVE NEGATIVE Final    Comment: (NOTE) SARS-CoV-2 target nucleic acids are NOT DETECTED.  The SARS-CoV-2 RNA is generally detectable in upper respiratory specimens during the acute phase of infection. The lowest concentration of SARS-CoV-2 viral copies this assay can detect is 138 copies/mL. A negative result does not preclude SARS-Cov-2 infection and should not be used as the sole basis for treatment or other patient management decisions. A negative result may occur with  improper specimen collection/handling, submission of specimen other than nasopharyngeal swab, presence of viral mutation(s) within the areas targeted by this assay, and inadequate number of viral copies(<138 copies/mL). A negative result must be combined with clinical observations, patient history,  and epidemiological information. The expected result is Negative.  Fact Sheet for Patients:  EntrepreneurPulse.com.au  Fact Sheet for Healthcare Providers:  IncredibleEmployment.be  This test is no t yet approved or cleared by the [place] FDA and  has been authorized for detection and/or diagnosis of SARS-CoV-2 by FDA under an Emergency Use Authorization (EUA). This EUA will remain  in effect (meaning this test can be used) for the duration of the COVID-19 declaration under Section 564(b)(1) of the Act, 21 U.S.C.section 360bbb-3(b)(1), unless the authorization is terminated  or revoked sooner.       Influenza A by PCR NEGATIVE NEGATIVE Final   Influenza B by PCR NEGATIVE NEGATIVE Final    Comment: (NOTE) The Xpert Xpress SARS-CoV-2/FLU/RSV plus assay is intended as an aid in the diagnosis of influenza from Nasopharyngeal swab specimens and should not be used as a sole basis for treatment. Nasal washings and aspirates are unacceptable for Xpert Xpress SARS-CoV-2/FLU/RSV testing.  Fact Sheet for Patients: EntrepreneurPulse.com.au  Fact Sheet for Healthcare Providers: IncredibleEmployment.be  This test is not yet approved or cleared by the [place] FDA and has been authorized for detection and/or diagnosis of SARS-CoV-2 by FDA under an Emergency Use Authorization (EUA). This EUA will remain in effect (meaning this test can be used) for the duration of the COVID-19 declaration under Section 564(b)(1) of the Act, 21 U.S.C. section 360bbb-3(b)(1), unless the authorization is terminated or revoked.  Performed at [facility], [address] [place], [place] [address]   Blood Culture ID Panel (Reflexed)     Status: Abnormal   Collection Time: 10/05/21 12:52 PM  Result Value Ref Range Status   Enterococcus faecalis NOT DETECTED NOT DETECTED Final   Enterococcus Faecium NOT DETECTED NOT  DETECTED Final   Listeria monocytogenes NOT DETECTED NOT DETECTED Final   Staphylococcus  species DETECTED (A) NOT DETECTED Final    Comment: CRITICAL RESULT CALLED TO, READ BACK BY AND VERIFIED WITH: NATHAN BELUE @ 8341 10/06/21 LFD    Staphylococcus aureus (BCID) DETECTED (A) NOT DETECTED Final    Comment: Methicillin (oxacillin)-resistant Staphylococcus aureus (MRSA). MRSA is predictably resistant to beta-lactam antibiotics (except ceftaroline). Preferred therapy is vancomycin unless clinically contraindicated. Patient requires contact precautions if  hospitalized. CRITICAL RESULT CALLED TO, READ BACK BY AND VERIFIED WITH: NATHAN BELUE @ 9622 10/06/21 LFD    Staphylococcus epidermidis NOT DETECTED NOT DETECTED Final   Staphylococcus lugdunensis NOT DETECTED NOT DETECTED Final   Streptococcus species NOT DETECTED NOT DETECTED Final   Streptococcus agalactiae NOT DETECTED NOT DETECTED Final   Streptococcus pneumoniae NOT DETECTED NOT DETECTED Final   Streptococcus pyogenes NOT DETECTED NOT DETECTED Final   A.calcoaceticus-baumannii NOT DETECTED NOT DETECTED Final   Bacteroides fragilis NOT DETECTED NOT DETECTED Final   Enterobacterales NOT DETECTED NOT DETECTED Final   Enterobacter cloacae complex NOT DETECTED NOT DETECTED Final   Escherichia coli NOT DETECTED NOT DETECTED Final   Klebsiella aerogenes NOT DETECTED NOT DETECTED Final   Klebsiella oxytoca NOT DETECTED NOT DETECTED Final   Klebsiella pneumoniae NOT DETECTED NOT DETECTED Final   Proteus species NOT DETECTED NOT DETECTED Final   Salmonella species NOT DETECTED NOT DETECTED Final   Serratia marcescens NOT DETECTED NOT DETECTED Final   Haemophilus influenzae NOT DETECTED NOT DETECTED Final   Neisseria meningitidis NOT DETECTED NOT DETECTED Final   Pseudomonas aeruginosa NOT DETECTED NOT DETECTED Final   Stenotrophomonas maltophilia NOT DETECTED NOT DETECTED Final   Candida albicans NOT DETECTED NOT DETECTED Final   Candida  auris NOT DETECTED NOT DETECTED Final   Candida glabrata NOT DETECTED NOT DETECTED Final   Candida krusei NOT DETECTED NOT DETECTED Final   Candida parapsilosis NOT DETECTED NOT DETECTED Final   Candida tropicalis NOT DETECTED NOT DETECTED Final   Cryptococcus neoformans/gattii NOT DETECTED NOT DETECTED Final   Meth resistant mecA/C and MREJ DETECTED (A) NOT DETECTED Final    Comment: CRITICAL RESULT CALLED TO, READ BACK BY AND VERIFIED WITHLloyd Huger @ 2979 10/06/21 LFD Performed at [facility], [address] [place], [place] [address]   MRSA Next Gen by PCR, Nasal     Status: Abnormal   Collection Time: 10/05/21  3:53 PM   Specimen: Nasal Mucosa; Nasal Swab  Result Value Ref Range Status   MRSA by PCR Next Gen DETECTED (A) NOT DETECTED Final    Comment: CRITICAL RESULT CALLED TO, READ BACK BY AND VERIFIED WITH: MYRA FLOWERS 10/05/21 @ 1735 BY SB (NOTE) The GeneXpert MRSA Assay (FDA approved for NASAL specimens only), is one component of a comprehensive MRSA colonization surveillance program. It is not intended to diagnose MRSA infection nor to guide or monitor treatment for MRSA infections. Test performance is not FDA approved in patients less than 73 years old. Performed at [facility], [address] [place], [place] [address]   CULTURE, BLOOD (ROUTINE X 2) w Reflex to ID Panel     Status: None (Preliminary result)   Collection Time: 10/07/21 12:23 AM   Specimen: BLOOD  Result Value Ref Range Status   Specimen Description BLOOD LEFT HAND  Final   Special Requests   Final    BOTTLES DRAWN AEROBIC AND ANAEROBIC Blood Culture results may not be optimal due to an excessive volume of blood received in culture bottles   Culture   Final  NO GROWTH < 12 HOURS Performed at [facility], [address] [place], [place] [address]    Report Status PENDING  Incomplete  CULTURE, BLOOD (ROUTINE X 2) w Reflex to ID Panel     Status: None  (Preliminary result)   Collection Time: 10/07/21 12:28 AM   Specimen: BLOOD  Result Value Ref Range Status   Specimen Description BLOOD LEFT FOREARM  Final   Special Requests   Final    BOTTLES DRAWN AEROBIC AND ANAEROBIC Blood Culture adequate volume   Culture   Final    NO GROWTH < 12 HOURS Performed at [facility], [address] [place], [place] [address]    Report Status PENDING  Incomplete      Radiology Studies: CT Head Wo Contrast  Result Date: 10/05/2021 CLINICAL DATA:  Increasing weakness since Friday EXAM: CT HEAD WITHOUT CONTRAST TECHNIQUE: Contiguous axial images were obtained from the base of the skull through the vertex without intravenous contrast. COMPARISON:  None. FINDINGS: Brain: No evidence of acute infarction, hemorrhage, hydrocephalus, extra-axial collection or mass lesion/mass effect. Vascular: No hyperdense vessel or unexpected calcification. Skull: No osseous abnormality. Sinuses/Orbits: Visualized paranasal sinuses are clear. Visualized mastoid sinuses are clear. Visualized orbits demonstrate no focal abnormality. Other: None IMPRESSION: No acute intracranial pathology. Electronically Signed   By: Kathreen Devoid M.D.   On: 10/05/2021 13:48   MR BRAIN WO CONTRAST  Result Date: 10/06/2021 CLINICAL DATA:  Acute neurologic deficit EXAM: MRI HEAD WITHOUT CONTRAST TECHNIQUE: Multiplanar, multiecho pulse sequences of the brain and surrounding structures were obtained without intravenous contrast. COMPARISON:  Head CT 10/05/2021 FINDINGS: Brain: No acute infarct, mass effect or extra-axial collection. No acute or chronic hemorrhage. There is multifocal hyperintense T2-weighted signal within the white matter. Parenchymal volume and CSF spaces are normal. 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 The midline structures are normal. Vascular: Major flow voids are preserved. Skull and upper cervical spine: Normal calvarium and skull base. Visualized upper cervical  spine and soft tissues are normal. Sinuses/Orbits:No paranasal sinus fluid levels or advanced mucosal thickening. No mastoid or middle ear effusion. Normal orbits. IMPRESSION: 1. No acute intracranial abnormality. 2. 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 Electronically Signed   By: Ulyses Jarred M.D.   On: 10/06/2021 00:28   MR THORACIC SPINE W WO CONTRAST  Result Date: 10/06/2021 CLINICAL DATA:  Intervertebral disc disorder EXAM: MRI THORACIC WITHOUT AND WITH CONTRAST TECHNIQUE: Multiplanar and multiecho pulse sequences of the thoracic spine were obtained without and with intravenous contrast. CONTRAST:  42mL GADAVIST GADOBUTROL 1 MMOL/ML IV SOLN COMPARISON:  None. FINDINGS: Alignment:  Physiologic. Vertebrae: No fracture, evidence of discitis, or bone lesion. Cord:  Normal signal and morphology. Paraspinal and other soft tissues: Small pleural effusions Disc levels: No spinal canal stenosis. No contrast enhancement. IMPRESSION: 1. Normal thoracic spine. 2. Small pleural effusions. Electronically Signed   By: Ulyses Jarred M.D.   On: 10/06/2021 23:39   MR Lumbar Spine W Wo Contrast  Result Date: 10/06/2021 CLINICAL DATA:  MRSA bacteremia with back pain EXAM: MRI LUMBAR SPINE WITHOUT AND WITH CONTRAST TECHNIQUE: Multiplanar and multiecho pulse sequences of the lumbar spine were obtained without and with intravenous contrast. CONTRAST:  75mL GADAVIST GADOBUTROL 1 MMOL/ML IV SOLN COMPARISON:  None. FINDINGS: Segmentation:  Standard. Alignment:  Physiologic. Vertebrae:  No fracture, evidence of discitis, or bone lesion. Conus medullaris and cauda equina: Conus extends to the L1 level. Conus and cauda equina appear  normal. Paraspinal and other soft tissues: Negative. Disc levels: The T12-L4 disc levels are normal. L4-5: Small disc bulge with mild facet hypertrophy. No spinal canal or neural foraminal stenosis. L5-S1: Small disc bulge with annular  fissure. Mild facet hypertrophy. No spinal canal or neural foraminal stenosis. No abnormal contrast enhancement. IMPRESSION: 1. No epidural abscess or discitis/osteomyelitis. 2. Mild lower lumbar degenerative disc disease without spinal canal or neural foraminal stenosis. Electronically Signed   By: Ulyses Jarred M.D.   On: 10/06/2021 23:42   DG Chest Portable 1 View  Result Date: 10/05/2021 CLINICAL DATA:  Weakness EXAM: PORTABLE CHEST 1 VIEW COMPARISON:  None. FINDINGS: Normal heart size. Normal mediastinal contour. No pneumothorax. No pleural effusion. Two small indistinct nodular opacities in the right lung measuring 7 mm in the upper right lung and 8 mm in the lower right lung. Clear left lung. IMPRESSION: Two small indistinct nodular opacities in the right lung. Chest CT with IV contrast recommended to evaluate for pulmonary nodules. Electronically Signed   By: Ilona Sorrel M.D.   On: 10/05/2021 13:27    Scheduled Meds:  Chlorhexidine Gluconate Cloth  6 each Topical Q0600   Chlorhexidine Gluconate Cloth  6 each Topical Q0600   citalopram  40 mg Oral Daily   feeding supplement (NEPRO CARB STEADY)  237 mL Oral TID BM   ibrutinib  420 mg Oral Daily   insulin aspart  0-20 Units Subcutaneous TID WC   insulin aspart  0-5 Units Subcutaneous QHS   insulin aspart  8 Units Subcutaneous TID WC   insulin glargine-yfgn  15 Units Subcutaneous BID   multivitamin with minerals  1 tablet Oral Daily   mupirocin ointment  1 application Nasal BID   Continuous Infusions:  vancomycin Stopped (10/07/21 0058)     LOS: 2 days   Time spent: 40 minutes. [name] than 50% of the time was spent in counseling/coordination of care  Lorella Nimrod, MD [facility]  If 7PM-7AM, please contact night-coverage [email or web address]  10/07/2021, 7:58 AM   This record has been created using Systems analyst. Errors have been sought and corrected,but may not always be located. Such creation errors do not  reflect on the standard of care.

## 2021-10-07 NOTE — Progress Notes (Signed)
[facility] INFECTIOUS DISEASE PROGRESS NOTE Date of Admission:  10/05/2021     ID: Travis More. is a 70 y.o. male with  MRSA bacteremia Principal Problem:   Severe sepsis with acute organ dysfunction ([place]) Active Problems:   CLL (chronic lymphocytic leukemia) ([place])   Varicose veins of bilateral lower extremities with pain   Diabetes mellitus type 2, uncomplicated (HCC)   Neuropathy associated with cancer (HCC)   Vitamin D deficiency   Hyperglycemia   AKI (acute kidney injury) ([place])   Hyperosmolar hyponatremia   Nodule of right lung   Malnutrition of moderate degree   Subjective: No fevers, wbc decreasing. MRI T L spine done  ROS  Eleven systems are reviewed and negative except per hpi  Medications:  Antibiotics Given (last 72 hours)     Date/Time Action Medication Dose Rate   10/05/21 1428 New Bag/Given   ceFEPIme (MAXIPIME) 2 g in sodium chloride 0.9 % 100 mL IVPB 2 g 200 mL/hr   10/05/21 1433 New Bag/Given   metroNIDAZOLE (FLAGYL) IVPB 500 mg 500 mg 100 mL/hr   10/05/21 1507 New Bag/Given   vancomycin (VANCOCIN) IVPB 1000 mg/200 mL premix 1,000 mg 200 mL/hr   10/05/21 1712 New Bag/Given   vancomycin (VANCOCIN) IVPB 1000 mg/200 mL premix 1,000 mg 200 mL/hr   10/05/21 2148 New Bag/Given  [KCL infusing prior]   metroNIDAZOLE (FLAGYL) IVPB 500 mg 500 mg 100 mL/hr   10/06/21 0142 New Bag/Given   ceFEPIme (MAXIPIME) 2 g in sodium chloride 0.9 % 100 mL IVPB 2 g 200 mL/hr   10/06/21 1357 New Bag/Given   ceFEPIme (MAXIPIME) 2 g in sodium chloride 0.9 % 100 mL IVPB 2 g 200 mL/hr   10/06/21 1427 New Bag/Given   vancomycin (VANCOCIN) IVPB 1000 mg/200 mL premix 1,000 mg 200 mL/hr   10/06/21 2356 New Bag/Given   vancomycin (VANCOCIN) IVPB 1000 mg/200 mL premix 1,000 mg 200 mL/hr   10/07/21 1109 New Bag/Given   vancomycin (VANCOCIN) IVPB 1000 mg/200 mL premix 1,000 mg 200 mL/hr       Chlorhexidine Gluconate Cloth  6 each Topical Q0600   Chlorhexidine Gluconate  Cloth  6 each Topical Q0600   citalopram  40 mg Oral Daily   enoxaparin (LOVENOX) injection  40 mg Subcutaneous Q24H   feeding supplement (NEPRO CARB STEADY)  237 mL Oral TID BM   ibrutinib  420 mg Oral Daily   insulin aspart  0-20 Units Subcutaneous TID WC   insulin aspart  0-5 Units Subcutaneous QHS   insulin aspart  8 Units Subcutaneous TID WC   insulin glargine-yfgn  15 Units Subcutaneous BID   multivitamin with minerals  1 tablet Oral Daily   mupirocin ointment  1 application Nasal BID    Objective: Vital signs in last 24 hours: Temp:  [96.9 F (36.1 C)] 96.9 F (36.1 C) (10/11 0900) Pulse Rate:  [88-101] 91 (10/11 1300) Resp:  [14-24] 17 (10/11 1300) BP: (112-149)/(68-87) 125/73 (10/11 1300) SpO2:  [96 %-100 %] 99 % (10/11 1300)   Lab Results Recent Labs    10/06/21 0546 10/07/21 0645  WBC 22.0* 15.4*  HGB 12.2* 12.0*  HCT 35.4* 32.9*  NA 134* 129*  K 3.8 3.7  CL 111 103  CO2 18* 15*  BUN 31* 29*  CREATININE 0.92 0.94    Microbiology: Results for orders placed or performed during the hospital encounter of 10/05/21  Urine Culture     Status: Abnormal (Preliminary result)   Collection  Time: 10/05/21 12:52 PM   Specimen: Urine, Clean Catch  Result Value Ref Range Status   Specimen Description   Final    URINE, CLEAN CATCH Performed at [facility], [address] [place], [place] [address]    Special Requests   Final    NONE Performed at [facility], [place], [place] [address]    Culture (A)  Final    >=100,000 COLONIES/mL STAPHYLOCOCCUS AUREUS CORRECTED ON 10/11 AT 1452: PREVIOUSLY REPORTED AS MULTIPLE SPECIES PRESENT, SUGGEST RECOLLECTION 80,000 COLONIES/mL GROUP B STREP(S.AGALACTIAE)ISOLATED TESTING AGAINST S. AGALACTIAE NOT ROUTINELY PERFORMED DUE TO PREDICTABILITY OF AMP/PEN/VAN SUSCEPTIBILITY. CULTURE REINCUBATED FOR BETTER GROWTH SUSCEPTIBILITIES TO FOLLOW Performed at [facility], [place] [address],  [place], [place] [address]    Report Status PENDING  Incomplete  Culture, blood (routine x 2)     Status: Abnormal (Preliminary result)   Collection Time: 10/05/21 12:52 PM   Specimen: BLOOD  Result Value Ref Range Status   Specimen Description   Final    BLOOD RIGHT FA Performed at [facility], [address], [place], [place] [address]    Special Requests   Final    BOTTLES DRAWN AEROBIC AND ANAEROBIC Blood Culture adequate volume Performed at [facility], [address] [place], [place] [address]    Culture  Setup Time   Final    Organism ID to follow IN BOTH AEROBIC AND ANAEROBIC BOTTLES GRAM POSITIVE COCCI CRITICAL RESULT CALLED TO, READ BACK BY AND VERIFIED WITH: Lloyd Huger @ [id number] 10/06/21 lfd Performed at [facility], [address], [place], [place] [address]    Culture (A)  Final    STAPHYLOCOCCUS AUREUS SUSCEPTIBILITIES TO FOLLOW Performed at [facility], [place] [address], [place], [place] [address]    Report Status PENDING  Incomplete  Culture, blood (routine x 2)     Status: Abnormal (Preliminary result)   Collection Time: 10/05/21 12:52 PM   Specimen: BLOOD  Result Value Ref Range Status   Specimen Description   Final    BLOOD RIGHT [facility] Performed at [facility], [address], [place], [place] [address]    Special Requests   Final    BOTTLES DRAWN AEROBIC AND ANAEROBIC Blood Culture adequate volume Performed at [facility], [address] [place], [place] [address]    Culture  Setup Time   Final    IN BOTH AEROBIC AND ANAEROBIC BOTTLES GRAM POSITIVE COCCI CRITICAL RESULT CALLED TO, READ BACK BY AND VERIFIED WITH: Lloyd Huger @ [id number] 10/06/21 lfd Performed at [facility], [address], [place], [place] [address]    Culture STAPHYLOCOCCUS AUREUS (A)  Final   Report Status PENDING  Incomplete  Resp Panel by RT-PCR (Flu A&B, Covid) Nasopharyngeal Swab     Status: None   Collection Time:  10/05/21 12:52 PM   Specimen: Nasopharyngeal Swab; Nasopharyngeal(NP) swabs in vial transport medium  Result Value Ref Range Status   SARS Coronavirus 2 by RT PCR NEGATIVE NEGATIVE Final    Comment: (NOTE) SARS-CoV-2 target nucleic acids are NOT DETECTED.  The SARS-CoV-2 RNA is generally detectable in upper respiratory specimens during the acute phase of infection. The lowest concentration of SARS-CoV-2 viral copies this assay can detect is 138 copies/mL. A negative result does not preclude SARS-Cov-2 infection and should not be used as the sole basis for treatment or other patient management decisions. A negative result may occur with  improper specimen  collection/handling, submission of specimen other than nasopharyngeal swab, presence of viral mutation(s) within the areas targeted by this assay, and inadequate number of viral copies(<138 copies/mL). A negative result must be combined with clinical observations, patient history, and epidemiological information. The expected result is Negative.  Fact Sheet for Patients:  EntrepreneurPulse.com.au  Fact Sheet for Healthcare Providers:  IncredibleEmployment.be  This test is no t yet approved or cleared by the [place] FDA and  has been authorized for detection and/or diagnosis of SARS-CoV-2 by FDA under an Emergency Use Authorization (EUA). This EUA will remain  in effect (meaning this test can be used) for the duration of the COVID-19 declaration under Section 564(b)(1) of the Act, 21 U.S.C.section 360bbb-3(b)(1), unless the authorization is terminated  or revoked sooner.       Influenza A by PCR NEGATIVE NEGATIVE Final   Influenza B by PCR NEGATIVE NEGATIVE Final    Comment: (NOTE) The Xpert Xpress SARS-CoV-2/FLU/RSV plus assay is intended as an aid in the diagnosis of influenza from Nasopharyngeal swab specimens and should not be used as a sole basis for treatment. Nasal washings  and aspirates are unacceptable for Xpert Xpress SARS-CoV-2/FLU/RSV testing.  Fact Sheet for Patients: EntrepreneurPulse.com.au  Fact Sheet for Healthcare Providers: IncredibleEmployment.be  This test is not yet approved or cleared by the [place] FDA and has been authorized for detection and/or diagnosis of SARS-CoV-2 by FDA under an Emergency Use Authorization (EUA). This EUA will remain in effect (meaning this test can be used) for the duration of the COVID-19 declaration under Section 564(b)(1) of the Act, 21 U.S.C. section 360bbb-3(b)(1), unless the authorization is terminated or revoked.  Performed at [facility], [address] [place], [place] [address]   Blood Culture ID Panel (Reflexed)     Status: Abnormal   Collection Time: 10/05/21 12:52 PM  Result Value Ref Range Status   Enterococcus faecalis NOT DETECTED NOT DETECTED Final   Enterococcus Faecium NOT DETECTED NOT DETECTED Final   Listeria monocytogenes NOT DETECTED NOT DETECTED Final   Staphylococcus species DETECTED (A) NOT DETECTED Final    Comment: CRITICAL RESULT CALLED TO, READ BACK BY AND VERIFIED WITH: NATHAN BELUE @ 6063 10/06/21 LFD    Staphylococcus aureus (BCID) DETECTED (A) NOT DETECTED Final    Comment: Methicillin (oxacillin)-resistant Staphylococcus aureus (MRSA). MRSA is predictably resistant to beta-lactam antibiotics (except ceftaroline). Preferred therapy is vancomycin unless clinically contraindicated. Patient requires contact precautions if  hospitalized. CRITICAL RESULT CALLED TO, READ BACK BY AND VERIFIED WITH: NATHAN BELUE @ 0160 10/06/21 LFD    Staphylococcus epidermidis NOT DETECTED NOT DETECTED Final   Staphylococcus lugdunensis NOT DETECTED NOT DETECTED Final   Streptococcus species NOT DETECTED NOT DETECTED Final   Streptococcus agalactiae NOT DETECTED NOT DETECTED Final   Streptococcus pneumoniae NOT DETECTED NOT DETECTED Final    Streptococcus pyogenes NOT DETECTED NOT DETECTED Final   A.calcoaceticus-baumannii NOT DETECTED NOT DETECTED Final   Bacteroides fragilis NOT DETECTED NOT DETECTED Final   Enterobacterales NOT DETECTED NOT DETECTED Final   Enterobacter cloacae complex NOT DETECTED NOT DETECTED Final   Escherichia coli NOT DETECTED NOT DETECTED Final   Klebsiella aerogenes NOT DETECTED NOT DETECTED Final   Klebsiella oxytoca NOT DETECTED NOT DETECTED Final   Klebsiella pneumoniae NOT DETECTED NOT DETECTED Final   Proteus species NOT DETECTED NOT DETECTED Final   Salmonella species NOT DETECTED NOT DETECTED Final   Serratia marcescens NOT DETECTED NOT DETECTED Final   Haemophilus influenzae NOT DETECTED NOT DETECTED Final  Neisseria meningitidis NOT DETECTED NOT DETECTED Final   Pseudomonas aeruginosa NOT DETECTED NOT DETECTED Final   Stenotrophomonas maltophilia NOT DETECTED NOT DETECTED Final   Candida albicans NOT DETECTED NOT DETECTED Final   Candida auris NOT DETECTED NOT DETECTED Final   Candida glabrata NOT DETECTED NOT DETECTED Final   Candida krusei NOT DETECTED NOT DETECTED Final   Candida parapsilosis NOT DETECTED NOT DETECTED Final   Candida tropicalis NOT DETECTED NOT DETECTED Final   Cryptococcus neoformans/gattii NOT DETECTED NOT DETECTED Final   Meth resistant mecA/C and MREJ DETECTED (A) NOT DETECTED Final    Comment: CRITICAL RESULT CALLED TO, READ BACK BY AND VERIFIED WITHLloyd Huger @ 3474 10/06/21 LFD Performed at [facility], [address] [place], [place] [address]   MRSA Next Gen by PCR, Nasal     Status: Abnormal   Collection Time: 10/05/21  3:53 PM   Specimen: Nasal Mucosa; Nasal Swab  Result Value Ref Range Status   MRSA by PCR Next Gen DETECTED (A) NOT DETECTED Final    Comment: CRITICAL RESULT CALLED TO, READ BACK BY AND VERIFIED WITH: MYRA FLOWERS 10/05/21 @ 1735 BY SB (NOTE) The GeneXpert MRSA Assay (FDA approved for NASAL specimens only), is one  component of a comprehensive MRSA colonization surveillance program. It is not intended to diagnose MRSA infection nor to guide or monitor treatment for MRSA infections. Test performance is not FDA approved in patients less than 34 years old. Performed at [facility], [address] [place], [place] [address]   CULTURE, BLOOD (ROUTINE X 2) w Reflex to ID Panel     Status: None (Preliminary result)   Collection Time: 10/07/21 12:23 AM   Specimen: BLOOD  Result Value Ref Range Status   Specimen Description BLOOD LEFT HAND  Final   Special Requests   Final    BOTTLES DRAWN AEROBIC AND ANAEROBIC Blood Culture results may not be optimal due to an excessive volume of blood received in culture bottles   Culture   Final    NO GROWTH < 12 HOURS Performed at [facility], [address] [place], [place] [address]    Report Status PENDING  Incomplete  CULTURE, BLOOD (ROUTINE X 2) w Reflex to ID Panel     Status: None (Preliminary result)   Collection Time: 10/07/21 12:28 AM   Specimen: BLOOD  Result Value Ref Range Status   Specimen Description BLOOD LEFT FOREARM  Final   Special Requests   Final    BOTTLES DRAWN AEROBIC AND ANAEROBIC Blood Culture adequate volume   Culture   Final    NO GROWTH < 12 HOURS Performed at [facility], [address] [place],  [address]    Report Status PENDING  Incomplete    Studies/Results: MR BRAIN WO CONTRAST  Result Date: 10/06/2021 CLINICAL DATA:  Acute neurologic deficit EXAM: MRI HEAD WITHOUT CONTRAST TECHNIQUE: Multiplanar, multiecho pulse sequences of the brain and surrounding structures were obtained without intravenous contrast. COMPARISON:  Head CT 10/05/2021 FINDINGS: Brain: No acute infarct, mass effect or extra-axial collection. No acute or chronic hemorrhage. There is multifocal hyperintense T2-weighted signal within the white matter. Parenchymal volume and CSF spaces are normal. 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 The midline structures are normal. Vascular: Major flow voids are preserved. Skull and upper cervical spine: Normal calvarium and skull base. Visualized upper cervical spine and soft tissues are normal. Sinuses/Orbits:No paranasal sinus fluid levels or advanced mucosal thickening. No mastoid or  middle ear effusion. Normal orbits. IMPRESSION: 1. No acute intracranial abnormality. 2. 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 Electronically Signed   By: Ulyses Jarred M.D.   On: 10/06/2021 00:28   MR THORACIC SPINE W WO CONTRAST  Result Date: 10/06/2021 CLINICAL DATA:  Intervertebral disc disorder EXAM: MRI THORACIC WITHOUT AND WITH CONTRAST TECHNIQUE: Multiplanar and multiecho pulse sequences of the thoracic spine were obtained without and with intravenous contrast. CONTRAST:  80mL GADAVIST GADOBUTROL 1 MMOL/ML IV SOLN COMPARISON:  None. FINDINGS: Alignment:  Physiologic. Vertebrae: No fracture, evidence of discitis, or bone lesion. Cord:  Normal signal and morphology. Paraspinal and other soft tissues: Small pleural effusions Disc levels: No spinal canal stenosis. No contrast enhancement. IMPRESSION: 1. Normal thoracic spine. 2. Small pleural effusions. Electronically Signed   By: Ulyses Jarred M.D.   On: 10/06/2021 23:39   MR Lumbar Spine W Wo Contrast  Result Date: 10/06/2021 CLINICAL DATA:  MRSA bacteremia with back pain EXAM: MRI LUMBAR SPINE WITHOUT AND WITH CONTRAST TECHNIQUE: Multiplanar and multiecho pulse sequences of the lumbar spine were obtained without and with intravenous contrast. CONTRAST:  68mL GADAVIST GADOBUTROL 1 MMOL/ML IV SOLN COMPARISON:  None. FINDINGS: Segmentation:  Standard. Alignment:  Physiologic. Vertebrae:  No fracture, evidence of discitis, or bone lesion. Conus medullaris and cauda equina: Conus extends to the L1 level. Conus and cauda equina appear normal. Paraspinal and other soft tissues: Negative.  Disc levels: The T12-L4 disc levels are normal. L4-5: Small disc bulge with mild facet hypertrophy. No spinal canal or neural foraminal stenosis. L5-S1: Small disc bulge with annular fissure. Mild facet hypertrophy. No spinal canal or neural foraminal stenosis. No abnormal contrast enhancement. IMPRESSION: 1. No epidural abscess or discitis/osteomyelitis. 2. Mild lower lumbar degenerative disc disease without spinal canal or neural foraminal stenosis. Electronically Signed   By: Ulyses Jarred M.D.   On: 10/06/2021 23:42   ECHOCARDIOGRAM COMPLETE  Result Date: 10/07/2021    ECHOCARDIOGRAM REPORT   Patient Name:   Travis Depaolo. Date of Exam: 10/06/2021 Medical Rec #:  [id number]           Height:       71.0 in Accession #:    [id number]          Weight:       181.0 lb Date of Birth:  1951/08/27           BSA:          2.021 m Patient Age:    40 years            BP:           122/69 mmHg Patient Gender: M                   HR:           92 bpm. Exam Location:  [facility] Procedure: 2D Echo, Color Doppler and Cardiac Doppler Indications:     R78.81 Bacteremia  History:         Patient has no prior history of Echocardiogram examinations.                  Risk Factors:Diabetes. Leukemia.  Sonographer:     Charmayne Sheer Referring Phys:  [id number] AMIN Diagnosing Phys: Serafina Royals MD  Sonographer Comments: Suboptimal parasternal window. IMPRESSIONS  1. Left ventricular ejection fraction, by estimation, is 60 to 65%. The left ventricle has normal function. The left ventricle has  no regional wall motion abnormalities. Left ventricular diastolic parameters were normal.  2. Right ventricular systolic function is normal. The right ventricular size is normal.  3. The mitral valve is normal in structure. Mild mitral valve regurgitation.  4. The aortic valve is normal in structure. Aortic valve regurgitation is trivial.  5. Aortic dilatation noted. 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 FINDINGS  Left Ventricle: Left ventricular ejection fraction, by estimation, is 60 to 65%. The left ventricle has normal function. The left ventricle has no regional wall motion abnormalities. The left ventricular internal cavity size was small. There is no left ventricular hypertrophy. Left ventricular diastolic parameters were normal. Right Ventricle: The right ventricular size is normal. No increase in right ventricular wall thickness. Right ventricular systolic function is normal. Left Atrium: Left atrial size was normal in size. Right Atrium: Right atrial size was normal in size. Pericardium: There is no evidence of pericardial effusion. Mitral Valve: The mitral valve is normal in structure. Mild mitral valve regurgitation. MV peak gradient, 2.9 mmHg. The mean mitral valve gradient is 1.0 mmHg. Tricuspid Valve: The tricuspid valve is normal in structure. Tricuspid valve regurgitation is trivial. Aortic Valve: The aortic valve is normal in structure. Aortic valve regurgitation is trivial. Aortic valve mean gradient measures 4.0 mmHg. Aortic valve peak gradient measures 6.8 mmHg. Aortic valve area, by VTI measures 4.32 cm. Pulmonic Valve: The pulmonic valve was normal in structure. Pulmonic valve regurgitation is not visualized. Aorta: Aortic dilatation noted. 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 IAS/Shunts: No atrial level shunt detected by color flow Doppler.  LEFT VENTRICLE PLAX 2D LVIDd:         4.60 cm   Diastology LVIDs:         2.90 cm   LV e' medial:    6.96 cm/s LV PW:         0.80 cm   LV E/e' medial:  9.9 LV IVS:        0.60 cm   LV e' lateral:   11.00 cm/s LVOT diam:     2.40 cm   LV E/e' lateral: 6.2 LV SV:         85 LV SV Index:   42 LVOT Area:     4.52 cm  RIGHT VENTRICLE RV Basal diam:  3.10 cm LEFT ATRIUM             Index        RIGHT ATRIUM          Index LA diam:        3.30 cm 1.63 cm/m   RA Area:     7.74 cm  LA Vol (A2C):   27.4 ml 13.56 ml/m  RA Volume:   10.90 ml 5.39 ml/m LA Vol (A4C):   24.6 ml 12.17 ml/m LA Biplane Vol: 26.7 ml 13.21 ml/m  AORTIC VALVE                    PULMONIC VALVE AV Area (Vmax):    3.69 cm     PV Vmax:       1.18 m/s AV Area (Vmean):   3.75 cm     PV Vmean:      81.900 cm/s AV Area (VTI):     4.32 cm     PV VTI:        0.186 m  AV Vmax:           130.00 cm/s  PV Peak grad:  5.6 mmHg AV Vmean:          93.600 cm/s  PV Mean grad:  3.0 mmHg AV VTI:            0.196 m AV Peak Grad:      6.8 mmHg AV Mean Grad:      4.0 mmHg LVOT Vmax:         106.00 cm/s LVOT Vmean:        77.500 cm/s LVOT VTI:          0.187 m LVOT/AV VTI ratio: 0.95  AORTA Ao Root diam: 4.40 cm MITRAL VALVE MV Area (PHT): 4.21 cm    SHUNTS MV Area VTI:   4.57 cm    Systemic VTI:  0.19 m MV Peak grad:  2.9 mmHg    Systemic Diam: 2.40 cm MV Mean grad:  1.0 mmHg MV Vmax:       0.85 m/s MV Vmean:      57.2 cm/s MV Decel Time: 180 msec MV E velocity: 68.60 cm/s MV A velocity: 66.40 cm/s MV E/A ratio:  1.03 Serafina Royals MD Electronically signed by Serafina Royals MD Signature Date/Time: 10/07/2021/12:24:52 PM    Final     Assessment/Plan: Travis More. is a 70 y.o. male with medical history significant for CLL, non-insulin-dependent diabetes mellitus, depression/anxiety, recent issues with urinary retention requiring I and O cath who was admitted  to the emergency department for chief concerns of weakness, tiredness, and shortness of breath on 10/9. Found to have sepsis, elevated wbc and DKA. He had [place] growing MRSA with no obvious source. TTE neg FU BCX neg MRI neg for T and L spine and brain.  Recommendations. Cont vanco Will need TEE.to evaluate for endocarditis. Will need min 2 weeks IV abx from first set of negative blood cultures.  Thank you very much for the consult. Will follow with you.  Leonel Ramsay   10/07/2021, 2:46 PM

## 2021-10-07 NOTE — Consult Note (Addendum)
Cardiology Consultation:   Patient ID: Travis [name]. MRN: [id number]; DOB: 1951-10-04  Admit date: 10/05/2021 Date of Consult: 10/07/2021  PCP:  Adin Hector, MD   [facility] [place]  Cardiologist: Iverson Alamin, Dr. Saunders Revel rounding Advanced Practice Provider:  No care team member to display Electrophysiologist:  None 6}    Patient Profile:   Travis [name]. is a 70 y.o. male with a hx of CLL, DM2, neuropathy, CA, and no previous known history of cardiac disease, and who is being seen today for the evaluation of TEE at the request of Dr. Reesa Chew.  History of Present Illness:   Mr. Travis [name] is a 70 year old male with PMH as above and no previously known history of cardiac disease or arrhythmia.  Family history includes a father with history of heart failure.  He does not have known/an official diagnosis of sleep apnea.  He occasionally drinks alcohol.    He has history of urinary retention with need for repeat urinary cath insertion.  His wife reports that his most recent repeat urinary catheter insertion resulted in injury with gross hematuria that lasted for some time before clearing.  Urology scheduled for cytoscopy, though this was not obtained due to admission as below.    He has history of dysphagia and esophageal stricture/stenosis with ongoing sx since his 2019 EGD, which showed  7 cm hiatal hernia, esophagitis, and esophageal stenosis.  Repeat EGD 2019, showed esophageal stenosis and large hiatal hernia.  PPI, carafate prescribed.  On 10/05/2021, he presented to the [facility] emergency department after 3 to 4 days of nausea with poor oral intake and associated progressive weakness, as well as elevated glucose.  He denied any associated abdominal pain.  He reported his symptoms started around Labor Day with nausea and poor oral intake.  Ongoing dysphagia reported by wife.  He reported progressive weakness and decreased stamina.  He was unable to walk up a flight of  steps.  He noted dyspnea.  No chest pain.  No known recent skin infections or wounds. The morning of his presentation to the ED, glucose ~400, despite recent addition of glipizide to his medications/medication compliance.    In the emergency department, labs consistent with diabetic ketoacidosis and blood cultures collected after meeting sepsis protocol.  Labs showed hyperglycemia with glucose 511, corrected sodium 135, potassium 4.9, creatinine 1.68 and elevated from baseline of Cr 1.2, BUN 42, anion gap 20, hemoglobin 15.7, hematocrit 47.1, platelets 416, WBC 38.9, LFTs WNL, alk phos 137, bilirubin 1.5.  UA showed with glucose over 500 and moderate hemoglobin, ketones, protein, leukocytes, and WBC with rare bacteria.  Lactic acid 2.9.  COVID and flu negative.  MRSA positive.  Urine culture showed staph aureus with findings consistent with UTI as well.  Blood culture showed staph aureus.  Chest x-ray with 2 small indistinct nodular opacities with recommendation for chest CT with IV contrast to further evaluate pulmonary nodules.  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  Mild lumbar degenerative disc disease and small bilateral effusions noted on spine imaging.   He was started on broad-spectrum antibiotics and insulin.    Transthoracic echo performed  10/06/2021 showed EF 60 to 65%, NR WMA, mild MR, trivial AR, and aortic dilation measuring 44 mm.  No valvular vegetations noted.  ID consulted with recommendation for TEE for further evaluation and to exclude valvular growth/endocarditis.  Of note: Given patient's history of esophageal stricture/dysphagia,  GI consulted today with plan for coordination of EGD and TEE.   Past Medical History:  Diagnosis Date   Diabetes mellitus without complication ([place])    Leukemia ([place])     Past Surgical History:  Procedure Laterality Date   COLONOSCOPY     ESOPHAGOGASTRODUODENOSCOPY  (EGD) WITH PROPOFOL N/A 03/14/2018   Procedure: ESOPHAGOGASTRODUODENOSCOPY (EGD) WITH PROPOFOL;  Surgeon: [name], MD;  Location: [facility] ENDOSCOPY;  Service: Gastroenterology;  Laterality: N/A;   ESOPHAGOGASTRODUODENOSCOPY (EGD) WITH PROPOFOL N/A 06/13/2018   Procedure: ESOPHAGOGASTRODUODENOSCOPY (EGD) WITH PROPOFOL;  Surgeon: [name], MD;  Location: [facility] ENDOSCOPY;  Service: Gastroenterology;  Laterality: N/A;   WISDOM TOOTH EXTRACTION       Home Medications:  Prior to Admission medications   Medication Sig Start Date End Date Taking? Authorizing Provider  acetaminophen (TYLENOL) 500 MG tablet Take 500-1,000 mg by mouth every 6 (six) hours as needed for mild pain.   Yes [provider]  Cholecalciferol (VITAMIN D3) 1000 units CAPS Take 1,000 Int'l Units by mouth daily.   Yes [provider]  citalopram (CELEXA) 20 MG tablet Take 40 mg by mouth daily.   Yes [provider]  glipiZIDE (GLUCOTROL XL) 2.5 MG 24 hr tablet Take 2.5 mg by mouth daily.   Yes [provider]  ibrutinib (IMBRUVICA) 420 MG TABS TAKE 1 TABLET BY MOUTH DAILY. 07/10/21 07/10/22 Yes Lloyd Huger, MD  metFORMIN (GLUCOPHAGE) 1000 MG tablet Take 1,000 mg by mouth 2 (two) times daily with a meal.   Yes [provider]    Inpatient Medications: Scheduled Meds:  Chlorhexidine Gluconate Cloth  6 each Topical Q0600   Chlorhexidine Gluconate Cloth  6 each Topical Q0600   citalopram  40 mg Oral Daily   enoxaparin (LOVENOX) injection  40 mg Subcutaneous Q24H   feeding supplement (NEPRO CARB STEADY)  237 mL Oral TID BM   ibrutinib  420 mg Oral Daily   insulin aspart  0-20 Units Subcutaneous TID WC   insulin aspart  0-5 Units Subcutaneous QHS   insulin aspart  8 Units Subcutaneous TID WC   insulin glargine-yfgn  15 Units Subcutaneous BID   multivitamin with minerals  1 tablet Oral Daily   mupirocin ointment  1 application Nasal BID   Continuous Infusions:  lactated ringers  100 mL/hr at 10/07/21 1508   vancomycin Stopped (10/07/21 1209)   PRN Meds: dextrose, melatonin, morphine injection  Allergies:   No Known Allergies  Social History:   Social History   Socioeconomic History   Marital status: Married    Spouse name: Not on file   Number of children: Not on file   Years of education: Not on file   Highest education level: Not on file  Occupational History   Not on file  Tobacco Use   Smoking status: Never   Smokeless tobacco: Never  Vaping Use   Vaping Use: Never used  Substance and Sexual Activity   Alcohol use: Yes    Comment: rarely   Drug use: No   Sexual activity: Yes  Other Topics Concern   Not on file  Social History Narrative   Not on file   Social Determinants of Health   Financial Resource Strain: Not on file  Food Insecurity: Not on file  Transportation Needs: Not on file  Physical Activity: Not on file  Stress: Not on file  Social Connections: Not on file  Intimate Partner Violence: Not on file    Family History:  Family History  Problem Relation Age of Onset   Leukemia Mother    Colon cancer Father    Diabetes Father    Heart disease Father    Diabetes Brother      ROS:  Please see the history of present illness.  Review of Systems  Constitutional:  Positive for malaise/fatigue and weight loss.  Respiratory:  Positive for shortness of breath.   Cardiovascular:  Negative for chest pain and leg swelling.  Gastrointestinal:  Positive for abdominal pain and nausea.  Genitourinary:  Positive for flank pain and hematuria.  Neurological:  Positive for weakness.  All other systems reviewed and are negative.  All other ROS reviewed and negative.     Physical Exam/Data:   Vitals:   10/07/21 1000 10/07/21 1100 10/07/21 1200 10/07/21 1300  BP: 139/79 137/77 135/81 125/73  Pulse: 98 98 93 91  Resp: 19 19 (!) 21 17  Temp:      TempSrc:      SpO2: 99% 98% 99% 99%  Weight:      Height:        Intake/Output  Summary (Last 24 hours) at 10/07/2021 1525 Last data filed at 10/07/2021 1508 Gross per 24 hour  Intake 1399.4 ml  Output 2300 ml  Net -900.6 ml   Last 3 Weights 10/05/2021 10/05/2021 09/26/2021  Weight (lbs) 181 lb 185 lb 185 lb  Weight (kg) 82.1 kg 83.915 kg 83.915 kg     Body mass index is 25.24 kg/m.  General: Frail / thin male, NAD. joined by his wife. HEENT: normal Neck: no JVD Vascular: No carotid bruits; radial pulses 2+ bilaterally Cardiac:  normal S1, S2; RRR; no murmur  Lungs: Poor inspiratory effort, reduced breath sounds bilaterally, worse at the R base Abd: soft, nontender, no hepatomegaly  Ext: no edema Musculoskeletal:  No deformities, BUE and BLE strength normal and equal Skin: warm and dry  Neuro:  CNs 2-12 intact, no focal abnormalities noted Psych:  Normal affect   EKG:  The EKG was personally reviewed and demonstrates:  Sinus tachycardia, 110 bpm, LVH, T wave inversion in the inferior leads versus artifact / baseline wander Telemetry:  Telemetry was personally reviewed and demonstrates: Not on telemetry  Relevant CV Studies: Echo 10/06/21  1. Left ventricular ejection fraction, by estimation, is 60 to 65%. The  left ventricle has normal function. The left ventricle has no regional  wall motion abnormalities. Left ventricular diastolic parameters were  normal.   2. Right ventricular systolic function is normal. The right ventricular  size is normal.   3. The mitral valve is normal in structure. Mild mitral valve  regurgitation.   4. The aortic valve is normal in structure. Aortic valve regurgitation is  trivial.   5. Aortic dilatation noted. 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   Laboratory Data:  High Sensitivity Troponin:  No results for input(s): TROPONINIHS in the last 720 hours.   Chemistry Recent Labs  Lab 10/06/21 0044 10/06/21 0546 10/07/21 0645  NA 130* 134* 129*  K 3.5 3.8 3.7   CL 107 111 103  CO2 15* 18* 15*  GLUCOSE 212* 149* 294*  BUN 34* 31* 29*  CREATININE 1.01 0.92 0.94  CALCIUM 8.0* 8.2* 8.2*  GFRNONAA >60 >60 >60  ANIONGAP 8 5 11     Recent Labs  Lab 10/05/21 1130  PROT 6.2*  ALBUMIN 2.5*  AST 19  ALT 15  ALKPHOS 137*  BILITOT 1.5*   Hematology Recent Labs  Lab 10/05/21 1252 10/06/21 0546 10/07/21 0645  WBC 32.7* 22.0* 15.4*  RBC 5.13 4.48 4.20*  HGB 14.7 12.2* 12.0*  HCT 43.2 35.4* 32.9*  MCV 84.2 79.0* 78.3*  MCH 28.7 27.2 28.6  MCHC 34.0 34.5 36.5*  RDW 13.9 13.7 14.5  PLT 326 269 233   BNPNo results for input(s): BNP, PROBNP in the last 168 hours.  DDimer No results for input(s): DDIMER in the last 168 hours.   Radiology/Studies:  CT Head Wo Contrast  Result Date: 10/05/2021 CLINICAL DATA:  Increasing weakness since Friday EXAM: CT HEAD WITHOUT CONTRAST TECHNIQUE: Contiguous axial images were obtained from the base of the skull through the vertex without intravenous contrast. COMPARISON:  None. FINDINGS: Brain: No evidence of acute infarction, hemorrhage, hydrocephalus, extra-axial collection or mass lesion/mass effect. Vascular: No hyperdense vessel or unexpected calcification. Skull: No osseous abnormality. Sinuses/Orbits: Visualized paranasal sinuses are clear. Visualized mastoid sinuses are clear. Visualized orbits demonstrate no focal abnormality. Other: None IMPRESSION: No acute intracranial pathology. Electronically Signed   By: Kathreen Devoid M.D.   On: 10/05/2021 13:48   MR BRAIN WO CONTRAST  Result Date: 10/06/2021 CLINICAL DATA:  Acute neurologic deficit EXAM: MRI HEAD WITHOUT CONTRAST TECHNIQUE: Multiplanar, multiecho pulse sequences of the brain and surrounding structures were obtained without intravenous contrast. COMPARISON:  Head CT 10/05/2021 FINDINGS: Brain: No acute infarct, mass effect or extra-axial collection. No acute or chronic hemorrhage. There is multifocal hyperintense T2-weighted signal within the white  matter. Parenchymal volume and CSF spaces are normal. 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 The midline structures are normal. Vascular: Major flow voids are preserved. Skull and upper cervical spine: Normal calvarium and skull base. Visualized upper cervical spine and soft tissues are normal. Sinuses/Orbits:No paranasal sinus fluid levels or advanced mucosal thickening. No mastoid or middle ear effusion. Normal orbits. IMPRESSION: 1. No acute intracranial abnormality. 2. 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 Electronically Signed   By: Ulyses Jarred M.D.   On: 10/06/2021 00:28   MR THORACIC SPINE W WO CONTRAST  Result Date: 10/06/2021 CLINICAL DATA:  Intervertebral disc disorder EXAM: MRI THORACIC WITHOUT AND WITH CONTRAST TECHNIQUE: Multiplanar and multiecho pulse sequences of the thoracic spine were obtained without and with intravenous contrast. CONTRAST:  76m GADAVIST GADOBUTROL 1 MMOL/ML IV SOLN COMPARISON:  None. FINDINGS: Alignment:  Physiologic. Vertebrae: No fracture, evidence of discitis, or bone lesion. Cord:  Normal signal and morphology. Paraspinal and other soft tissues: Small pleural effusions Disc levels: No spinal canal stenosis. No contrast enhancement. IMPRESSION: 1. Normal thoracic spine. 2. Small pleural effusions. Electronically Signed   By: KUlyses JarredM.D.   On: 10/06/2021 23:39   MR Lumbar Spine W Wo Contrast  Result Date: 10/06/2021 CLINICAL DATA:  MRSA bacteremia with back pain EXAM: MRI LUMBAR SPINE WITHOUT AND WITH CONTRAST TECHNIQUE: Multiplanar and multiecho pulse sequences of the lumbar spine were obtained without and with intravenous contrast. CONTRAST:  862mGADAVIST GADOBUTROL 1 MMOL/ML IV SOLN COMPARISON:  None. FINDINGS: Segmentation:  Standard. Alignment:  Physiologic. Vertebrae:  No fracture, evidence of discitis, or bone lesion. Conus medullaris and cauda equina: Conus extends to the L1 level.  Conus and cauda equina appear normal. Paraspinal and other soft tissues: Negative. Disc levels: The T12-L4 disc levels are normal. L4-5: Small disc bulge with mild facet hypertrophy. No spinal canal or neural foraminal stenosis. L5-S1: Small disc bulge with annular fissure. Mild facet hypertrophy. No  spinal canal or neural foraminal stenosis. No abnormal contrast enhancement. IMPRESSION: 1. No epidural abscess or discitis/osteomyelitis. 2. Mild lower lumbar degenerative disc disease without spinal canal or neural foraminal stenosis. Electronically Signed   By: Ulyses Jarred M.D.   On: 10/06/2021 23:42   DG Chest Portable 1 View  Result Date: 10/05/2021 CLINICAL DATA:  Weakness EXAM: PORTABLE CHEST 1 VIEW COMPARISON:  None. FINDINGS: Normal heart size. Normal mediastinal contour. No pneumothorax. No pleural effusion. Two small indistinct nodular opacities in the right lung measuring 7 mm in the upper right lung and 8 mm in the lower right lung. Clear left lung. IMPRESSION: Two small indistinct nodular opacities in the right lung. Chest CT with IV contrast recommended to evaluate for pulmonary nodules. Electronically Signed   By: Ilona Sorrel M.D.   On: 10/05/2021 13:27   ECHOCARDIOGRAM COMPLETE  Result Date: 10/07/2021    ECHOCARDIOGRAM REPORT   Patient Name:   Macari Zalesky. Date of Exam: 10/06/2021 Medical Rec #:  [id number]           Height:       71.0 in Accession #:    [id number]          Weight:       181.0 lb Date of Birth:  10/22/1951           BSA:          2.021 m Patient Age:    54 years            BP:           122/69 mmHg Patient Gender: M                   HR:           92 bpm. Exam Location:  [facility] Procedure: 2D Echo, Color Doppler and Cardiac Doppler Indications:     R78.81 Bacteremia  History:         Patient has no prior history of Echocardiogram examinations.                  Risk Factors:Diabetes. Leukemia.  Sonographer:     Charmayne Sheer Referring Phys:  [id number] GGYIRSW AMIN Diagnosing  Phys: Serafina Royals MD  Sonographer Comments: Suboptimal parasternal window. IMPRESSIONS  1. Left ventricular ejection fraction, by estimation, is 60 to 65%. The left ventricle has normal function. The left ventricle has no regional wall motion abnormalities. Left ventricular diastolic parameters were normal.  2. Right ventricular systolic function is normal. The right ventricular size is normal.  3. The mitral valve is normal in structure. Mild mitral valve regurgitation.  4. The aortic valve is normal in structure. Aortic valve regurgitation is trivial.  5. Aortic dilatation noted. 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 FINDINGS  Left Ventricle: Left ventricular ejection fraction, by estimation, is 60 to 65%. The left ventricle has normal function. The left ventricle has no regional wall motion abnormalities. The left ventricular internal cavity size was small. There is no left ventricular hypertrophy. Left ventricular diastolic parameters were normal. Right Ventricle: The right ventricular size is normal. No increase in right ventricular wall thickness. Right ventricular systolic function is normal. Left Atrium: Left atrial size was normal in size. Right Atrium: Right atrial size was normal in size. Pericardium: There is no evidence of pericardial effusion. Mitral Valve: The mitral valve is normal in structure. Mild mitral valve regurgitation. MV peak  gradient, 2.9 mmHg. The mean mitral valve gradient is 1.0 mmHg. Tricuspid Valve: The tricuspid valve is normal in structure. Tricuspid valve regurgitation is trivial. Aortic Valve: The aortic valve is normal in structure. Aortic valve regurgitation is trivial. Aortic valve mean gradient measures 4.0 mmHg. Aortic valve peak gradient measures 6.8 mmHg. Aortic valve area, by VTI measures 4.32 cm. Pulmonic Valve: The pulmonic valve was normal in structure. Pulmonic valve regurgitation is not visualized. Aorta:  Aortic dilatation noted. 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 IAS/Shunts: No atrial level shunt detected by color flow Doppler.  LEFT VENTRICLE PLAX 2D LVIDd:         4.60 cm   Diastology LVIDs:         2.90 cm   LV e' medial:    6.96 cm/s LV PW:         0.80 cm   LV E/e' medial:  9.9 LV IVS:        0.60 cm   LV e' lateral:   11.00 cm/s LVOT diam:     2.40 cm   LV E/e' lateral: 6.2 LV SV:         85 LV SV Index:   42 LVOT Area:     4.52 cm  RIGHT VENTRICLE RV Basal diam:  3.10 cm LEFT ATRIUM             Index        RIGHT ATRIUM          Index LA diam:        3.30 cm 1.63 cm/m   RA Area:     7.74 cm LA Vol (A2C):   27.4 ml 13.56 ml/m  RA Volume:   10.90 ml 5.39 ml/m LA Vol (A4C):   24.6 ml 12.17 ml/m LA Biplane Vol: 26.7 ml 13.21 ml/m  AORTIC VALVE                    PULMONIC VALVE AV Area (Vmax):    3.69 cm     PV Vmax:       1.18 m/s AV Area (Vmean):   3.75 cm     PV Vmean:      81.900 cm/s AV Area (VTI):     4.32 cm     PV VTI:        0.186 m AV Vmax:           130.00 cm/s  PV Peak grad:  5.6 mmHg AV Vmean:          93.600 cm/s  PV Mean grad:  3.0 mmHg AV VTI:            0.196 m AV Peak Grad:      6.8 mmHg AV Mean Grad:      4.0 mmHg LVOT Vmax:         106.00 cm/s LVOT Vmean:        77.500 cm/s LVOT VTI:          0.187 m LVOT/AV VTI ratio: 0.95  AORTA Ao Root diam: 4.40 cm MITRAL VALVE MV Area (PHT): 4.21 cm    SHUNTS MV Area VTI:   4.57 cm    Systemic VTI:  0.19 m MV Peak grad:  2.9 mmHg    Systemic Diam: 2.40 cm MV Mean grad:  1.0 mmHg MV Vmax:       0.85 m/s MV Vmean:  57.2 cm/s MV Decel Time: 180 msec MV E velocity: 68.60 cm/s MV A velocity: 66.40 cm/s MV E/A ratio:  1.03 Serafina Royals MD Electronically signed by Serafina Royals MD Signature Date/Time: 10/07/2021/12:24:52 PM    Final      Assessment and Plan:   Bacteremia/Sepsis, staph aureus --Presented to [facility] with weakness as above.  No known cardiac history.  He denies any  recent skin wounds or infection.  He does have a recent traumatic cath insertion with labs showing UTI and urine culture positive for staph aureus.  ID consulted due to blood cultures positive for staph aureus with recommendation for transesophageal echo to exclude endocarditis in the setting of his bacteremia.  TTE with EF nl and no evidence of valvular vegetation. Patient evaluated today and reports ongoing dysphagia with GI consulted to reevaluate esophageal stricture, as any worsening stricture would complicate his TEE/increase the risk of injury to the esophagus during his TEE.  Tentative plan to perform EGD and then TEE this Thursday,  if GI deems pt acceptable to proceed with TEE, and to avoid 2 separate episodes of sedation. TEE risks and benefits discussed with the patient and his wife with patient agreeable to this plan.   Shared Decision Making/Informed Consent{The risks [esophageal damage, perforation (1:10,000 risk), bleeding, pharyngeal hematoma as well as other potential complications associated with conscious sedation including aspiration, arrhythmia, respiratory failure and death], benefits (treatment guidance and diagnostic support) and alternatives of a transesophageal echocardiogram were discussed in detail with Mr. Ashe and he is willing to proceed.   Pending GI evaluation, orders will be placed for TEE. Will make n.p.o. after midnight tonight in the event that GI is able to evaluate sooner than Thursday.   For questions or updates, please contact [place] Please consult [email or web address] for contact info under    Signed, Arvil Chaco, PA-C  10/07/2021 3:25 PM

## 2021-10-08 ENCOUNTER — Encounter: Payer: BC Managed Care – PPO | Admitting: Urology

## 2021-10-08 ENCOUNTER — Other Ambulatory Visit (HOSPITAL_COMMUNITY): Payer: Self-pay

## 2021-10-08 DIAGNOSIS — K449 Diaphragmatic hernia without obstruction or gangrene: Secondary | ICD-10-CM

## 2021-10-08 DIAGNOSIS — R7881 Bacteremia: Secondary | ICD-10-CM

## 2021-10-08 DIAGNOSIS — R911 Solitary pulmonary nodule: Secondary | ICD-10-CM

## 2021-10-08 DIAGNOSIS — B9562 Methicillin resistant Staphylococcus aureus infection as the cause of diseases classified elsewhere: Secondary | ICD-10-CM

## 2021-10-08 DIAGNOSIS — N139 Obstructive and reflux uropathy, unspecified: Secondary | ICD-10-CM

## 2021-10-08 DIAGNOSIS — E1110 Type 2 diabetes mellitus with ketoacidosis without coma: Secondary | ICD-10-CM

## 2021-10-08 DIAGNOSIS — R1319 Other dysphagia: Secondary | ICD-10-CM

## 2021-10-08 DIAGNOSIS — C9110 Chronic lymphocytic leukemia of B-cell type not having achieved remission: Secondary | ICD-10-CM

## 2021-10-08 LAB — BASIC METABOLIC PANEL
Anion gap: 7 (ref 5–15)
BUN: 26 mg/dL — ABNORMAL HIGH (ref 8–23)
CO2: 21 mmol/L — ABNORMAL LOW (ref 22–32)
Calcium: 8 mg/dL — ABNORMAL LOW (ref 8.9–10.3)
Chloride: 102 mmol/L (ref 98–111)
Creatinine, Ser: 0.88 mg/dL (ref 0.61–1.24)
GFR, Estimated: >60 mL/min (ref >60)
Glucose, Bld: 189 mg/dL — ABNORMAL HIGH (ref 70–99)
Potassium: 3.5 mmol/L (ref 3.5–5.1)
Sodium: 130 mmol/L — ABNORMAL LOW (ref 135–145)

## 2021-10-08 LAB — CBC
HCT: 33.5 % — ABNORMAL LOW (ref 39.0–52.0)
Hemoglobin: 11.5 g/dL — ABNORMAL LOW (ref 13.0–17.0)
MCH: 27.1 pg (ref 26.0–34.0)
MCHC: 34.3 g/dL (ref 30.0–36.0)
MCV: 78.8 fL — ABNORMAL LOW (ref 80.0–100.0)
Platelets: 238 10*3/uL (ref 150–400)
RBC: 4.25 MIL/uL (ref 4.22–5.81)
RDW: 14 % (ref 11.5–15.5)
WBC: 13.3 10*3/uL — ABNORMAL HIGH (ref 4.0–10.5)
nRBC: 0 % (ref 0.0–0.2)

## 2021-10-08 LAB — CULTURE, BLOOD (ROUTINE X 2)
Special Requests: ADEQUATE
Special Requests: ADEQUATE

## 2021-10-08 LAB — GLUCOSE, CAPILLARY
Glucose-Capillary: 225 mg/dL — ABNORMAL HIGH (ref 70–99)
Glucose-Capillary: 310 mg/dL — ABNORMAL HIGH (ref 70–99)
Glucose-Capillary: 164 mg/dL — ABNORMAL HIGH (ref 70–99)
Glucose-Capillary: 117 mg/dL — ABNORMAL HIGH (ref 70–99)

## 2021-10-08 MED ORDER — SODIUM CHLORIDE 0.9 % IV SOLN: INTRAVENOUS | Status: DC

## 2021-10-08 NOTE — Assessment & Plan Note (Signed)
--   Presented with AKI and acute metabolic encephalopathy.  Both resolved, sepsis symptomatology resolved.   --Secondary to MRSA bacteremia, unclear source.  No hardware or obvious injuries reported.  Imaging of the spine was unrevealing. -- Work-up as below.  Continue antibiotics.

## 2021-10-08 NOTE — Progress Notes (Addendum)
Inpatient Diabetes Program Recommendations  AACE/ADA: New Consensus Statement on Inpatient Glycemic Control (2015)  Target Ranges:  Prepandial:   less than 140 mg/dL      Peak postprandial:   less than 180 mg/dL (1-2 hours)      Critically ill patients:  140 - 180 mg/dL  Results for SESAR, MADEWELL (MRN [id number]) as of 10/08/2021 07:19  Ref. Range 10/07/2021 07:30 10/07/2021 11:19 10/07/2021 17:01 10/07/2021 20:09  Glucose-Capillary Latest Ref Range: 70 - 99 mg/dL 293 (H)  19 units Novolog _0  292 (H)  19 units Novolog _1   15 units Semglee _2  187 (H)  12 units Novolog _3  131 (H)  Results for SHLOIME, KEILMAN (MRN [id number]) as of 10/08/2021 10:35  Ref. Range 10/08/2021 08:15  Glucose-Capillary Latest Ref Range: 70 - 99 mg/dL 225 (H)  Results for CAELEB, BATALLA (MRN [id number]) as of 10/08/2021 07:19  Ref. Range 10/06/2021 05:46  Hemoglobin A1C Latest Ref Range: 4.8 - 5.6 % 11.4 (H)  (280 mg/dl)   Admit with: Sepsis secondary to MRSA bacteremia/ DKA  History: DM, CLL  Home DM Meds: Glipizide 2.5 mg Daily       Metformin 1000 mg BID  Current Orders: Semglee 15 units BID     Novolog Resistant Correction Scale/ SSI (0-20 units) TID AC + HS     Novolog 8 units TID with meals     MD- Please be aware that pt Refused his dose of Semglee last PM.  CBG elevated this AM likely b/c of refusal of Basal insulin last PM.  Per record review, looks like pt saw his PCP Dr. Caryl Comes with [facility] on 10/03/2021--PCP added Glipizide 2.5 mg daily at that visit.  Is willing to take insulin at home if necessary (has take insulin in the past).    Addendum 11:30am--Met w/ pt and wife at bedside.  Per conversation, pt recently restarted his Metformin at home about 2 weeks prior to admission.  Had been off Metformin for about 1 year due to self-stopping.  Pt saw his PCP Dr. Caryl Comes on 10/03/2021 and Dr. Caryl Comes gave pt a Rx for Glipizide 2.5 mg daily (has only taken a few  doses prior to this admission).  Has taken insulin in the past (2014).  Wife had noticed pt's CBGs were in the 200-300 range at home and urged pt to go to PCP.   Has CBG meter at home.    Per wife, pt having multiple other issues and she is concerned that high CBGs may be the culprit.  We discussed admission glucose and how pt's renal function was impaired at time of admission.  Discussed how IVF and SQ Insulin have significantly helped pt's CBGs and that renal function has improved as well.  Reviewed current Insulin regimen in hospital and how/why we are giving insulin.  Discussed with pt and wife that MD will likely restart home oral DM meds at time of d/c.  Also reviewed current A1c, goal A1c, and goal CBGs for home.  Pt is open and willing to start insulin at home if needed.  Showed pt's wife how to use insulin pen at home in case decision made to d/c pt home on insulin.  Pt's wife able to provide successful return demo of use of the Insulin pen.  Reviewed storage of insulin and discard of sharps at home.  If pt to only resume oral DM when he goes home, strongly encouraged pt and wife to have pt follow up  with his PCP in 3 months for recheck of his A1c.  Also encouraged CBG checks at home.  Wife told me they may seek care under another PCP after d/c.   --Will follow patient during hospitalization--  Wyn Quaker RN, MSN, CDE Diabetes Coordinator Inpatient Glycemic Control Team Team Pager: [phone] (8a-5p)

## 2021-10-08 NOTE — Assessment & Plan Note (Signed)
--   Resolved, secondary to sepsis.

## 2021-10-08 NOTE — Hospital Course (Addendum)
70 year old man was at home with wife, ambulates with cane without problems, PMH diabetes, CLL, presented with fatigue. --September with AKI, secondary to urinary retention, discharged with in/out catheter instructions --October 2 presented with urinary retention, Foley catheter placed, plans for outpatient cystoscopy --This hospitalization presented with marked hyperglycemia, treated for DKA --Treated for sepsis, found to have MSRA bacteremia, imaging unrevealing.  Plan for EGD/TEE 10/13.  (EGD given history of esophageal abnormalities) continue antibiotics per infectious disease.  Once clear, home with PICC line and outpatient antibiotics.  Consults  ID Cardiology  GI  I discussed with the patient that since he has a large hiatal hernia, referral to a surgeon to consider hiatal hernia repair given that previous dilations have not alleviated his symptoms, would be recommended.  This can be done on an outpatient basis.   I have discussed alternative options, risks & benefits,  which include, but are not limited to, bleeding, infection, perforation,respiratory complication & drug reaction.  The patient agrees with this plan & written consent will be obtained.     Patient will need clinic follow-up with Dr. Vicente Males on discharge as well

## 2021-10-08 NOTE — Assessment & Plan Note (Signed)
--   Resolved, secondary to sepsis

## 2021-10-08 NOTE — Consult Note (Signed)
Travis Antigua, MD [address] [place], [place], [place], [address] [address], [place], [place], [place], [address] Phone: [phone]  Fax: [phone]  Consultation  Referring Provider:     Dr. Reesa Chew Primary Care Physician:  Travis Hector, MD Reason for Consultation:     Dysphagia  Date of Admission:  10/05/2021 Date of Consultation:  10/08/2021         HPI:   Travis Grant. is a 70 y.o. male with MRSA bacteremia on this admission, with TEE planned by cardiology, and they requested GI consult for evaluation of dysphagia given that patient has had EGD with dilation in the past.  Patient reports chronic history of intermittent dysphagia and had EGD with dilation with Dr. On.  Notes from Dr. Vicente Males reviewed and patient had an EGD in 2019 with initial EGD showing esophagitis, and subsequent EGD with benign intrinsic stenosis which was dilated.  Large hiatal hernia was reported and consideration for hiatal hernia referral surgery was briefly discussed as per the procedure report.  Patient states that the dilation did not alleviate his dysphagia symptoms and symptoms have continued since then.  Denies any abdominal pain, nausea or vomiting.  The only imaging on this admission have included MRI of her spine, brain and CT head, and chest x-ray.  The MRI spine did not comment on his esophagus or any esophageal lesions  Past Medical History:  Diagnosis Date   Diabetes mellitus without complication ([place])    Leukemia ([place])     Past Surgical History:  Procedure Laterality Date   COLONOSCOPY     ESOPHAGOGASTRODUODENOSCOPY (EGD) WITH PROPOFOL N/A 03/14/2018   Procedure: ESOPHAGOGASTRODUODENOSCOPY (EGD) WITH PROPOFOL;  Surgeon: [name], MD;  Location: [facility] ENDOSCOPY;  Service: Gastroenterology;  Laterality: N/A;   ESOPHAGOGASTRODUODENOSCOPY (EGD) WITH PROPOFOL N/A 06/13/2018   Procedure: ESOPHAGOGASTRODUODENOSCOPY (EGD) WITH PROPOFOL;  Surgeon: [name], MD;  Location:  [facility] ENDOSCOPY;  Service: Gastroenterology;  Laterality: N/A;   WISDOM TOOTH EXTRACTION      Prior to Admission medications   Medication Sig Start Date End Date Taking? Authorizing Provider  acetaminophen (TYLENOL) 500 MG tablet Take 500-1,000 mg by mouth every 6 (six) hours as needed for mild pain.   Yes [provider]  Cholecalciferol (VITAMIN D3) 1000 units CAPS Take 1,000 Int'l Units by mouth daily.   Yes [provider]  citalopram (CELEXA) 20 MG tablet Take 40 mg by mouth daily.   Yes [provider]  glipiZIDE (GLUCOTROL XL) 2.5 MG 24 hr tablet Take 2.5 mg by mouth daily.   Yes [provider]  ibrutinib (IMBRUVICA) 420 MG TABS TAKE 1 TABLET BY MOUTH DAILY. 07/10/21 07/10/22 Yes Lloyd Huger, MD  metFORMIN (GLUCOPHAGE) 1000 MG tablet Take 1,000 mg by mouth 2 (two) times daily with a meal.   Yes [provider]    Family History  Problem Relation Age of Onset   Leukemia Mother    Colon cancer Father    Diabetes Father    Heart disease Father    Diabetes Brother      Social History   Tobacco Use   Smoking status: Never   Smokeless tobacco: Never  Vaping Use   Vaping Use: Never used  Substance Use Topics   Alcohol use: Yes    Comment: rarely   Drug use: No    Allergies as of 10/05/2021   (No Known Allergies)    Review of Systems:    All systems  reviewed and negative except where noted in HPI.   Physical Exam:  Constitutional: General:   Alert,  Well-developed, well-nourished, pleasant and cooperative in NAD BP 118/79 (BP Location: Right Arm)   Pulse 90   Temp (!) 96.8 F (36 C)   Resp 20   Ht 5\' 11"  (1.803 m)   Wt 82.1 kg   SpO2 96%   BMI 25.24 kg/m   Eyes:  Sclera clear, no icterus.   Conjunctiva pink. PERRLA  Ears:  No scars, lesions or masses, Normal auditory acuity. Nose:  No deformity, discharge, or lesions. Mouth:  No deformity or lesions, oropharynx pink & moist.  Neck:  Supple; no masses or  thyromegaly.  Respiratory: Normal respiratory effort, Normal percussion  Gastrointestinal:  Normal bowel sounds.  No bruits.  Soft, non-tender and non-distended without masses, hepatosplenomegaly or hernias noted.  No guarding or rebound tenderness.     Cardiac: No clubbing or edema.  No cyanosis. Normal posterior tibial pedal pulses noted.  Lymphatic:  No significant cervical or axillary adenopathy.  Psych:  Alert and cooperative. Normal mood and affect.  Musculoskeletal:  Normal gait. Head normocephalic, atraumatic. Symmetrical without gross deformities. 5/5 Upper and Lower extremity strength bilaterally.  Skin: Warm. Intact without significant lesions or rashes. No jaundice.  Neurologic:  Face symmetrical, tongue midline, Normal sensation to touch;  grossly normal neurologically.  Psych:  Alert and oriented x3, Alert and cooperative. Normal mood and affect.   LAB RESULTS: Recent Labs    10/06/21 0546 10/07/21 0645 10/08/21 0458  WBC 22.0* 15.4* 13.3*  HGB 12.2* 12.0* 11.5*  HCT 35.4* 32.9* 33.5*  PLT 269 233 238   BMET Recent Labs    10/06/21 0546 10/07/21 0645 10/08/21 0458  NA 134* 129* 130*  K 3.8 3.7 3.5  CL 111 103 102  CO2 18* 15* 21*  GLUCOSE 149* 294* 189*  BUN 31* 29* 26*  CREATININE 0.92 0.94 0.88  CALCIUM 8.2* 8.2* 8.0*   LFT No results for input(s): PROT, ALBUMIN, AST, ALT, ALKPHOS, BILITOT, BILIDIR, IBILI in the last 72 hours. PT/INR No results for input(s): LABPROT, INR in the last 72 hours.  STUDIES: MR THORACIC SPINE W WO CONTRAST  Result Date: 10/06/2021 CLINICAL DATA:  Intervertebral disc disorder EXAM: MRI THORACIC WITHOUT AND WITH CONTRAST TECHNIQUE: Multiplanar and multiecho pulse sequences of the thoracic spine were obtained without and with intravenous contrast. CONTRAST:  74mL GADAVIST GADOBUTROL 1 MMOL/ML IV SOLN COMPARISON:  None. FINDINGS: Alignment:  Physiologic. Vertebrae: No fracture, evidence of discitis, or bone lesion. Cord:   Normal signal and morphology. Paraspinal and other soft tissues: Small pleural effusions Disc levels: No spinal canal stenosis. No contrast enhancement. IMPRESSION: 1. Normal thoracic spine. 2. Small pleural effusions. Electronically Signed   By: Ulyses Jarred M.D.   On: 10/06/2021 23:39   MR Lumbar Spine W Wo Contrast  Result Date: 10/06/2021 CLINICAL DATA:  MRSA bacteremia with back pain EXAM: MRI LUMBAR SPINE WITHOUT AND WITH CONTRAST TECHNIQUE: Multiplanar and multiecho pulse sequences of the lumbar spine were obtained without and with intravenous contrast. CONTRAST:  54mL GADAVIST GADOBUTROL 1 MMOL/ML IV SOLN COMPARISON:  None. FINDINGS: Segmentation:  Standard. Alignment:  Physiologic. Vertebrae:  No fracture, evidence of discitis, or bone lesion. Conus medullaris and cauda equina: Conus extends to the L1 level. Conus and cauda equina appear normal. Paraspinal and other soft tissues: Negative. Disc levels: The T12-L4 disc levels are normal. L4-5: Small disc bulge with mild facet hypertrophy. No spinal canal or  neural foraminal stenosis. L5-S1: Small disc bulge with annular fissure. Mild facet hypertrophy. No spinal canal or neural foraminal stenosis. No abnormal contrast enhancement. IMPRESSION: 1. No epidural abscess or discitis/osteomyelitis. 2. Mild lower lumbar degenerative disc disease without spinal canal or neural foraminal stenosis. Electronically Signed   By: Ulyses Jarred M.D.   On: 10/06/2021 23:42      Impression / Plan:   Tery Hoeger. is a 70 y.o. y/o male with chronic history of dysphagia, with MRSA bacteremia on this admission, with TEE planned by cardiology and they are requesting EGD and clearing the esophagus prior to TEE  I discussed with Dr. Saunders Revel that his dysphagia is chronic and he may have underlying esophagitis or benign stricture like Schatzki's ring at the end of his esophagus.  However, they would like his EGD done prior to attempting a TEE  We are attempting to  coordinate with cardiology to be able to state the patient wanted to do the EGD just prior to his TEE tomorrow  I discussed with the patient that since he has a large hiatal hernia, referral to a surgeon to consider hiatal hernia repair given that previous dilations have not alleviated his symptoms, would be recommended.  This can be done on an outpatient basis.  I have discussed alternative options, risks & benefits,  which include, but are not limited to, bleeding, infection, perforation,respiratory complication & drug reaction.  The patient agrees with this plan & written consent will be obtained.    Patient will need clinic follow-up with Dr. Vicente Males on discharge as well   Thank you for involving me in the care of this patient.      LOS: 3 days   Travis Manifold, MD  10/08/2021, 12:18 PM

## 2021-10-08 NOTE — Assessment & Plan Note (Signed)
--   Two small indistinct nodular opacities in the right lung. Chest CTwith IV contrast recommended to evaluate for pulmonary nodules. Follow-up as an outpatient.

## 2021-10-08 NOTE — TOC Progression Note (Signed)
Transition of Care (TOC) - Progression Note    Patient Details  Name: Travis [name]. MRN: [id number] Date of Birth: 09-May-1951  Transition of Care [facility]) CM/SW S[place], RN Phone Number: 10/08/2021, 4:32 PM  Clinical Narrative:   Per ID, patient will be discharged on IV antibiotics, Pam Chandler notified.  TOC contact information provided, TOC to follow.         Expected Discharge Plan and Services                                                 Social Determinants of Health (SDOH) Interventions    Readmission Risk Interventions No flowsheet data found.

## 2021-10-08 NOTE — Assessment & Plan Note (Signed)
--   Stable, continue subcutaneous insulin long-acting, sliding scale insulin, meal coverage.  Can resume oral medications on discharge glipizide and metformin.

## 2021-10-08 NOTE — Assessment & Plan Note (Signed)
--   Continue Foley catheter, follow-up with urology as an outpatient for cystoscopy

## 2021-10-08 NOTE — Progress Notes (Signed)
Travis [name] INFECTIOUS DISEASE PROGRESS NOTE Date of Admission:  10/05/2021     ID: Travis [name]. is a 70 y.o. male with  MRSA bacteremia Principal Problem:   Severe sepsis with acute organ dysfunction ([place]) Active Problems:   CLL (chronic lymphocytic leukemia) ([place])   Varicose veins of bilateral lower extremities with pain   Diabetes mellitus type 2, uncomplicated (HCC)   Neuropathy associated with cancer (HCC)   Vitamin D deficiency   AKI (acute kidney injury) ([place])   Nodule of right lung   Malnutrition of moderate degree   DKA (diabetic ketoacidosis) ([place])   Obstructive uropathy   MRSA bacteremia   Subjective: No fevers, wbc decreasing. Feels a little stronger.  ROS  Eleven systems are reviewed and negative except per hpi  Medications:  Antibiotics Given (last 72 hours)     Date/Time Action Medication Dose Rate   10/05/21 1428 New Bag/Given   ceFEPIme (MAXIPIME) 2 g in sodium chloride 0.9 % 100 mL IVPB 2 g 200 mL/hr   10/05/21 1433 New Bag/Given   metroNIDAZOLE (FLAGYL) IVPB 500 mg 500 mg 100 mL/hr   10/05/21 1507 New Bag/Given   vancomycin (VANCOCIN) IVPB 1000 mg/200 mL premix 1,000 mg 200 mL/hr   10/05/21 1712 New Bag/Given   vancomycin (VANCOCIN) IVPB 1000 mg/200 mL premix 1,000 mg 200 mL/hr   10/05/21 2148 New Bag/Given  [KCL infusing prior]   metroNIDAZOLE (FLAGYL) IVPB 500 mg 500 mg 100 mL/hr   10/06/21 0142 New Bag/Given   ceFEPIme (MAXIPIME) 2 g in sodium chloride 0.9 % 100 mL IVPB 2 g 200 mL/hr   10/06/21 1357 New Bag/Given   ceFEPIme (MAXIPIME) 2 g in sodium chloride 0.9 % 100 mL IVPB 2 g 200 mL/hr   10/06/21 1427 New Bag/Given   vancomycin (VANCOCIN) IVPB 1000 mg/200 mL premix 1,000 mg 200 mL/hr   10/06/21 2356 New Bag/Given   vancomycin (VANCOCIN) IVPB 1000 mg/200 mL premix 1,000 mg 200 mL/hr   10/07/21 1109 New Bag/Given   vancomycin (VANCOCIN) IVPB 1000 mg/200 mL premix 1,000 mg 200 mL/hr   10/07/21 2249 New Bag/Given   vancomycin  (VANCOCIN) IVPB 1000 mg/200 mL premix 1,000 mg 200 mL/hr   10/08/21 0947 New Bag/Given   vancomycin (VANCOCIN) IVPB 1000 mg/200 mL premix 1,000 mg 200 mL/hr       Chlorhexidine Gluconate Cloth  6 each Topical Q0600   Chlorhexidine Gluconate Cloth  6 each Topical Q0600   citalopram  40 mg Oral Daily   enoxaparin (LOVENOX) injection  40 mg Subcutaneous Q24H   feeding supplement (NEPRO CARB STEADY)  237 mL Oral TID BM   ibrutinib  420 mg Oral Daily   insulin aspart  0-20 Units Subcutaneous TID WC   insulin aspart  0-5 Units Subcutaneous QHS   insulin aspart  8 Units Subcutaneous TID WC   insulin glargine-yfgn  15 Units Subcutaneous BID   multivitamin with minerals  1 tablet Oral Daily   mupirocin ointment  1 application Nasal BID    Objective: Vital signs in last 24 hours: Temp:  [96.8 F (36 C)-98.3 F (36.8 C)] 96.8 F (36 C) (10/12 1203) Pulse Rate:  [84-94] 90 (10/12 1203) Resp:  [16-20] 20 (10/12 1203) BP: (118-136)/(74-87) 118/79 (10/12 1203) SpO2:  [96 %-98 %] 96 % (10/12 1203) Physical Exam  Constitutional: He is oriented to person, place, and time. Chronically ill apperaing HENT: anicteric Mouth/Throat: Oropharynx is clear and moist. No oropharyngeal exudate.  Cardiovascular: Normal rate, regular rhythm  and normal heart sounds. Exam reveals no gallop and no friction rub.  No murmur heard.  Pulmonary/Chest: Effort normal and breath sounds normal. No respiratory distress. He has no wheezes.  Abdominal: Soft. Bowel sounds are normal. He exhibits no distension. There is no tenderness.  Lymphadenopathy:  He has no cervical adenopathy.  Neurological: He is alert and oriented to person, place, and time.  Skin: Skin is warm and dry. No rash noted. No erythema.  Psychiatric: He has a normal mood and affect. His behavior is normal.     Lab Results Recent Labs    10/07/21 0645 10/08/21 0458  WBC 15.4* 13.3*  HGB 12.0* 11.5*  HCT 32.9* 33.5*  NA 129* 130*  K 3.7 3.5   CL 103 102  CO2 15* 21*  BUN 29* 26*  CREATININE 0.94 0.88    Microbiology: Results for orders placed or performed during the hospital encounter of 10/05/21  Urine Culture     Status: Abnormal (Preliminary result)   Collection Time: 10/05/21 12:52 PM   Specimen: Urine, Clean Catch  Result Value Ref Range Status   Specimen Description   Final    URINE, CLEAN CATCH Performed at [facility], [address], [place], [place] [address]    Special Requests   Final    NONE Performed at [facility], [address], [place], [place] [address]    Culture (A)  Final    >=100,000 COLONIES/mL METHICILLIN RESISTANT STAPHYLOCOCCUS AUREUS CORRECTED ON 10/11 AT 1452: PREVIOUSLY REPORTED AS MULTIPLE SPECIES PRESENT, SUGGEST RECOLLECTION 80,000 COLONIES/mL GROUP B STREP(S.AGALACTIAE)ISOLATED TESTING AGAINST S. AGALACTIAE NOT ROUTINELY PERFORMED DUE TO PREDICTABILITY OF AMP/PEN/VAN SUSCEPTIBILITY. 60,000 COLONIES/mL ENTEROCOCCUS FAECALIS SUSCEPTIBILITIES TO FOLLOW Performed at [facility], [place] [address], [place], [place] [address]    Report Status PENDING  Incomplete   Organism ID, Bacteria METHICILLIN RESISTANT STAPHYLOCOCCUS AUREUS (A)  Final      Susceptibility   Methicillin resistant staphylococcus aureus - MIC*    CIPROFLOXACIN <=0.5 SENSITIVE Sensitive     GENTAMICIN <=0.5 SENSITIVE Sensitive     NITROFURANTOIN <=16 SENSITIVE Sensitive     OXACILLIN >=4 RESISTANT Resistant     TETRACYCLINE <=1 SENSITIVE Sensitive     VANCOMYCIN <=0.5 SENSITIVE Sensitive     TRIMETH/SULFA <=10 SENSITIVE Sensitive     CLINDAMYCIN <=0.25 SENSITIVE Sensitive     RIFAMPIN <=0.5 SENSITIVE Sensitive     Inducible Clindamycin NEGATIVE Sensitive     * >=100,000 COLONIES/mL METHICILLIN RESISTANT STAPHYLOCOCCUS AUREUS CORRECTED ON 10/11 AT 1452: PREVIOUSLY REPORTED AS MULTIPLE SPECIES PRESENT, SUGGEST RECOLLECTION  Culture, blood (routine x 2)     Status: Abnormal   Collection Time:  10/05/21 12:52 PM   Specimen: BLOOD  Result Value Ref Range Status   Specimen Description   Final    BLOOD RIGHT FA Performed at [facility], [address], [place], [place] [address]    Special Requests   Final    BOTTLES DRAWN AEROBIC AND ANAEROBIC Blood Culture adequate volume Performed at [facility], [address] [place], [place] [address]    Culture  Setup Time   Final    Organism ID to follow IN BOTH AEROBIC AND ANAEROBIC BOTTLES GRAM POSITIVE COCCI CRITICAL RESULT CALLED TO, READ BACK BY AND VERIFIED WITHLloyd Huger @ 7425 10/06/21 lfd Performed at [facility], [address] [place],  [address]    Culture METHICILLIN RESISTANT STAPHYLOCOCCUS AUREUS (A)  Final   Report Status 10/08/2021 FINAL  Final   Organism ID, Bacteria METHICILLIN RESISTANT STAPHYLOCOCCUS AUREUS  Final      Susceptibility   Methicillin resistant staphylococcus aureus - MIC*    CIPROFLOXACIN <=0.5 SENSITIVE Sensitive     ERYTHROMYCIN 1 INTERMEDIATE Intermediate     GENTAMICIN <=0.5 SENSITIVE Sensitive     OXACILLIN >=4 RESISTANT Resistant     TETRACYCLINE <=1 SENSITIVE Sensitive     VANCOMYCIN <=0.5 SENSITIVE Sensitive     TRIMETH/SULFA <=10 SENSITIVE Sensitive     CLINDAMYCIN <=0.25 SENSITIVE Sensitive     RIFAMPIN <=0.5 SENSITIVE Sensitive     Inducible Clindamycin NEGATIVE Sensitive     * METHICILLIN RESISTANT STAPHYLOCOCCUS AUREUS  Culture, blood (routine x 2)     Status: Abnormal   Collection Time: 10/05/21 12:52 PM   Specimen: BLOOD  Result Value Ref Range Status   Specimen Description   Final    BLOOD RIGHT AC Performed at [facility], [address], [place], [place] [address]    Special Requests   Final    BOTTLES DRAWN AEROBIC AND ANAEROBIC Blood Culture adequate volume Performed at [facility], [address] [place], [place] [address]    Culture  Setup Time   Final    IN BOTH AEROBIC AND ANAEROBIC  BOTTLES GRAM POSITIVE COCCI CRITICAL RESULT CALLED TO, READ BACK BY AND VERIFIED WITH: Lloyd Huger @ 2458 10/06/21 lfd Performed at [facility], [address] [place], [place] [address]    Culture (A)  Final    STAPHYLOCOCCUS AUREUS SUSCEPTIBILITIES PERFORMED ON PREVIOUS CULTURE WITHIN THE LAST 5 DAYS. Performed at [facility], [place] [address], [place], [place] [address]    Report Status 10/08/2021 FINAL  Final  Resp Panel by RT-PCR (Flu A&B, Covid) Nasopharyngeal Swab     Status: None   Collection Time: 10/05/21 12:52 PM   Specimen: Nasopharyngeal Swab; Nasopharyngeal(NP) swabs in vial transport medium  Result Value Ref Range Status   SARS Coronavirus 2 by RT PCR NEGATIVE NEGATIVE Final    Comment: (NOTE) SARS-CoV-2 target nucleic acids are NOT DETECTED.  The SARS-CoV-2 RNA is generally detectable in upper respiratory specimens during the acute phase of infection. The lowest concentration of SARS-CoV-2 viral copies this assay can detect is 138 copies/mL. A negative result does not preclude SARS-Cov-2 infection and should not be used as the sole basis for treatment or other patient management decisions. A negative result may occur with  improper specimen collection/handling, submission of specimen other than nasopharyngeal swab, presence of viral mutation(s) within the areas targeted by this assay, and inadequate number of viral copies(<138 copies/mL). A negative result must be combined with clinical observations, patient history, and epidemiological information. The expected result is Negative.  Fact Sheet for Patients:  EntrepreneurPulse.com.au  Fact Sheet for Healthcare Providers:  IncredibleEmployment.be  This test is no t yet approved or cleared by the [place] FDA and  has been authorized for detection and/or diagnosis of SARS-CoV-2 by FDA under an Emergency Use Authorization (EUA). This EUA will remain  in  effect (meaning this test can be used) for the duration of the COVID-19 declaration under Section 564(b)(1) of the Act, 21 U.S.C.section 360bbb-3(b)(1), unless the authorization is terminated  or revoked sooner.       Influenza A by PCR NEGATIVE NEGATIVE Final   Influenza B by PCR NEGATIVE NEGATIVE Final    Comment: (NOTE) The Xpert Xpress SARS-CoV-2/FLU/RSV plus assay is intended as an aid in the diagnosis of influenza from Nasopharyngeal  swab specimens and should not be used as a sole basis for treatment. Nasal washings and aspirates are unacceptable for Xpert Xpress SARS-CoV-2/FLU/RSV testing.  Fact Sheet for Patients: EntrepreneurPulse.com.au  Fact Sheet for Healthcare Providers: IncredibleEmployment.be  This test is not yet approved or cleared by the [place] FDA and has been authorized for detection and/or diagnosis of SARS-CoV-2 by FDA under an Emergency Use Authorization (EUA). This EUA will remain in effect (meaning this test can be used) for the duration of the COVID-19 declaration under Section 564(b)(1) of the Act, 21 U.S.C. section 360bbb-3(b)(1), unless the authorization is terminated or revoked.  Performed at [facility], [address] [place], [place] [address]   Blood Culture ID Panel (Reflexed)     Status: Abnormal   Collection Time: 10/05/21 12:52 PM  Result Value Ref Range Status   Enterococcus faecalis NOT DETECTED NOT DETECTED Final   Enterococcus Faecium NOT DETECTED NOT DETECTED Final   Listeria monocytogenes NOT DETECTED NOT DETECTED Final   Staphylococcus species DETECTED (A) NOT DETECTED Final    Comment: CRITICAL RESULT CALLED TO, READ BACK BY AND VERIFIED WITH: NATHAN BELUE @ 0998 10/06/21 LFD    Staphylococcus aureus (BCID) DETECTED (A) NOT DETECTED Final    Comment: Methicillin (oxacillin)-resistant Staphylococcus aureus (MRSA). MRSA is predictably resistant to beta-lactam antibiotics  (except ceftaroline). Preferred therapy is vancomycin unless clinically contraindicated. Patient requires contact precautions if  hospitalized. CRITICAL RESULT CALLED TO, READ BACK BY AND VERIFIED WITH: NATHAN BELUE @ 3382 10/06/21 LFD    Staphylococcus epidermidis NOT DETECTED NOT DETECTED Final   Staphylococcus lugdunensis NOT DETECTED NOT DETECTED Final   Streptococcus species NOT DETECTED NOT DETECTED Final   Streptococcus agalactiae NOT DETECTED NOT DETECTED Final   Streptococcus pneumoniae NOT DETECTED NOT DETECTED Final   Streptococcus pyogenes NOT DETECTED NOT DETECTED Final   A.calcoaceticus-baumannii NOT DETECTED NOT DETECTED Final   Bacteroides fragilis NOT DETECTED NOT DETECTED Final   Enterobacterales NOT DETECTED NOT DETECTED Final   Enterobacter cloacae complex NOT DETECTED NOT DETECTED Final   Escherichia coli NOT DETECTED NOT DETECTED Final   Klebsiella aerogenes NOT DETECTED NOT DETECTED Final   Klebsiella oxytoca NOT DETECTED NOT DETECTED Final   Klebsiella pneumoniae NOT DETECTED NOT DETECTED Final   Proteus species NOT DETECTED NOT DETECTED Final   Salmonella species NOT DETECTED NOT DETECTED Final   Serratia marcescens NOT DETECTED NOT DETECTED Final   Haemophilus influenzae NOT DETECTED NOT DETECTED Final   Neisseria meningitidis NOT DETECTED NOT DETECTED Final   Pseudomonas aeruginosa NOT DETECTED NOT DETECTED Final   Stenotrophomonas maltophilia NOT DETECTED NOT DETECTED Final   Candida albicans NOT DETECTED NOT DETECTED Final   Candida auris NOT DETECTED NOT DETECTED Final   Candida glabrata NOT DETECTED NOT DETECTED Final   Candida krusei NOT DETECTED NOT DETECTED Final   Candida parapsilosis NOT DETECTED NOT DETECTED Final   Candida tropicalis NOT DETECTED NOT DETECTED Final   Cryptococcus neoformans/gattii NOT DETECTED NOT DETECTED Final   Meth resistant mecA/C and MREJ DETECTED (A) NOT DETECTED Final    Comment: CRITICAL RESULT CALLED TO, READ BACK BY  AND VERIFIED WITHLloyd Huger @ 5053 10/06/21 LFD Performed at [facility], [address] [place], [place] [address]   MRSA Next Gen by PCR, Nasal     Status: Abnormal   Collection Time: 10/05/21  3:53 PM   Specimen: Nasal Mucosa; Nasal Swab  Result Value Ref Range Status   MRSA by PCR Next Gen DETECTED (  A) NOT DETECTED Final    Comment: CRITICAL RESULT CALLED TO, READ BACK BY AND VERIFIED WITH: MYRA FLOWERS 10/05/21 @ 1735 BY SB (NOTE) The GeneXpert MRSA Assay (FDA approved for NASAL specimens only), is one component of a comprehensive MRSA colonization surveillance program. It is not intended to diagnose MRSA infection nor to guide or monitor treatment for MRSA infections. Test performance is not FDA approved in patients less than 33 years old. Performed at [facility], [address] [place], [place] [address]   CULTURE, BLOOD (ROUTINE X 2) w Reflex to ID Panel     Status: None (Preliminary result)   Collection Time: 10/07/21 12:23 AM   Specimen: BLOOD  Result Value Ref Range Status   Specimen Description BLOOD LEFT HAND  Final   Special Requests   Final    BOTTLES DRAWN AEROBIC AND ANAEROBIC Blood Culture results may not be optimal due to an excessive volume of blood received in culture bottles   Culture   Final    NO GROWTH 1 DAY Performed at [facility], [address], [place], [place] [address]    Report Status PENDING  Incomplete  CULTURE, BLOOD (ROUTINE X 2) w Reflex to ID Panel     Status: None (Preliminary result)   Collection Time: 10/07/21 12:28 AM   Specimen: BLOOD  Result Value Ref Range Status   Specimen Description BLOOD LEFT FOREARM  Final   Special Requests   Final    BOTTLES DRAWN AEROBIC AND ANAEROBIC Blood Culture adequate volume   Culture   Final    NO GROWTH 1 DAY Performed at [facility], [address], [place], [place] [address]    Report Status PENDING  Incomplete  CULTURE, BLOOD (ROUTINE X 2) w  Reflex to ID Panel     Status: None (Preliminary result)   Collection Time: 10/07/21  7:00 AM   Specimen: BLOOD  Result Value Ref Range Status   Specimen Description BLOOD [facility]  Final   Special Requests BOTTLES DRAWN AEROBIC AND ANAEROBIC  BCAV  Final   Culture   Final    NO GROWTH 1 DAY Performed at [facility], [address] [place], [place] [address]    Report Status PENDING  Incomplete  CULTURE, BLOOD (ROUTINE X 2) w Reflex to ID Panel     Status: None (Preliminary result)   Collection Time: 10/07/21  7:05 AM   Specimen: BLOOD  Result Value Ref Range Status   Specimen Description BLOOD  [facility]  Final   Special Requests BOTTLES DRAWN AEROBIC AND ANAEROBIC  BCAV  Final   Culture  Setup Time   Final    GRAM POSITIVE COCCI ANAEROBIC BOTTLE ONLY CRITICAL VALUE NOTED.  VALUE IS CONSISTENT WITH PREVIOUSLY REPORTED AND CALLED VALUE. Performed at [facility], [address] [place], [place] [address]    Culture GRAM POSITIVE COCCI  Final   Report Status PENDING  Incomplete    Studies/Results: MR THORACIC SPINE W WO CONTRAST  Result Date: 10/06/2021 CLINICAL DATA:  Intervertebral disc disorder EXAM: MRI THORACIC WITHOUT AND WITH CONTRAST TECHNIQUE: Multiplanar and multiecho pulse sequences of the thoracic spine were obtained without and with intravenous contrast. CONTRAST:  6mL GADAVIST GADOBUTROL 1 MMOL/ML IV SOLN COMPARISON:  None. FINDINGS: Alignment:  Physiologic. Vertebrae: No fracture, evidence of discitis, or bone lesion. Cord:  Normal signal and morphology. Paraspinal and other soft tissues: Small pleural effusions Disc levels: No spinal canal stenosis. No contrast enhancement. IMPRESSION:  1. Normal thoracic spine. 2. Small pleural effusions. Electronically Signed   By: Ulyses Jarred M.D.   On: 10/06/2021 23:39   MR Lumbar Spine W Wo Contrast  Result Date: 10/06/2021 CLINICAL DATA:  MRSA bacteremia with back pain EXAM: MRI LUMBAR SPINE WITHOUT AND WITH  CONTRAST TECHNIQUE: Multiplanar and multiecho pulse sequences of the lumbar spine were obtained without and with intravenous contrast. CONTRAST:  48mL GADAVIST GADOBUTROL 1 MMOL/ML IV SOLN COMPARISON:  None. FINDINGS: Segmentation:  Standard. Alignment:  Physiologic. Vertebrae:  No fracture, evidence of discitis, or bone lesion. Conus medullaris and cauda equina: Conus extends to the L1 level. Conus and cauda equina appear normal. Paraspinal and other soft tissues: Negative. Disc levels: The T12-L4 disc levels are normal. L4-5: Small disc bulge with mild facet hypertrophy. No spinal canal or neural foraminal stenosis. L5-S1: Small disc bulge with annular fissure. Mild facet hypertrophy. No spinal canal or neural foraminal stenosis. No abnormal contrast enhancement. IMPRESSION: 1. No epidural abscess or discitis/osteomyelitis. 2. Mild lower lumbar degenerative disc disease without spinal canal or neural foraminal stenosis. Electronically Signed   By: Ulyses Jarred M.D.   On: 10/06/2021 23:42    Assessment/Plan: Travis [name]. is a 70 y.o. male with medical history significant for CLL, non-insulin-dependent diabetes mellitus, depression/anxiety, recent issues with urinary retention requiring I and O cath who was admitted  to the emergency department for chief concerns of weakness, tiredness, and shortness of breath on 10/9. Found to have sepsis, elevated wbc and DKA. He had [place] growing MRSA with no obvious source. TTE neg FU BCX neg MRI neg for T and L spine and brain.  Recommendations. Cont vanco. Can check if dapto will be covered by insurance for otpt use Will need TEE.to evaluate for endocarditis. Sched for 10-13 at same time as EGD Will need min 2 weeks IV abx from first set of negative blood cultures.  May need longer Can place picc when bcx neg 48 hours. Elim 10/11 is growing GPC. Will repeat [place] today.  Thank you very much for the consult. Will follow with you.  Travis [name]   10/08/2021, 1:57 PM

## 2021-10-08 NOTE — Assessment & Plan Note (Signed)
--   Stable, continue ibrutinib, supportive care

## 2021-10-08 NOTE — Progress Notes (Signed)
Progress Note    Travis [name].   ION:[id number]  DOB: 1951-02-18  DOA: 10/05/2021     3 Date of Service: 10/08/2021  70 year old man was at home with wife, ambulates with cane without problems, PMH diabetes, CLL, presented with fatigue. --September with AKI, secondary to urinary retention, discharged with in/out catheter instructions --October 2 presented with urinary retention, Foley catheter placed, plans for outpatient cystoscopy --This hospitalization presented with marked hyperglycemia, treated for DKA --Treated for sepsis, found to have MSRA bacteremia, imaging unrevealing.  Plan for EGD/TEE 10/13.  (EGD given history of esophageal abnormalities) continue antibiotics per infectious disease.  Once clear, home with PICC line and outpatient antibiotics.  Consults  ID Cardiology  GI  Hospital Problems * Severe sepsis with acute organ dysfunction ([place]) -- Presented with AKI and acute metabolic encephalopathy.  Both resolved, sepsis symptomatology resolved.   --Secondary to MRSA bacteremia, unclear source.  No hardware or obvious injuries reported.  Imaging of the spine was unrevealing. -- Work-up as below.  Continue antibiotics.  MRSA bacteremia -- Etiology unclear.  See above. -- Plan for TEE, EGD tomorrow, continue antibiotics per infectious disease.  Repeat cultures 10/11 no growth thus far  Diabetes mellitus type 2, uncomplicated (HCC) -- Stable, continue subcutaneous insulin long-acting, sliding scale insulin, meal coverage.  Can resume oral medications on discharge glipizide and metformin.  Malnutrition of moderate degree -- Management per dietitian  Obstructive uropathy -- Continue Foley catheter, follow-up with urology as an outpatient for cystoscopy  DKA (diabetic ketoacidosis) ([place]) -- Resolved, secondary to sepsis.  Nodule of right lung -- Two small indistinct nodular opacities in the right lung. Chest CTwith IV contrast recommended to evaluate for  pulmonary nodules. Follow-up as an outpatient.  AKI (acute kidney injury) ([place]) -- Resolved, secondary to sepsis  CLL (chronic lymphocytic leukemia) ([employer]) -- Stable, continue ibrutinib, supportive care   Subjective:  Feels fine today, no nausea or vomiting, appetite somewhat poor.  No shortness of breath.  Objective Vital signs were reviewed and unremarkable.  Vitals:   10/07/21 2008 10/08/21 0056 10/08/21 0617 10/08/21 0845  BP: 127/87 135/74 129/78 125/78  Pulse: 87 88 94 87  Resp: 16 16 16 18   Temp: 98.3 F (36.8 C) 97.6 F (36.4 C) (!) 97.4 F (36.3 C) (!) 97.5 F (36.4 C)  TempSrc: Oral Oral Oral   SpO2: 97% 97% 96% 96%  Weight:      Height:       82.1 kg  Exam Physical Exam Constitutional:      General: He is not in acute distress.    Appearance: He is not ill-appearing or toxic-appearing.  Cardiovascular:     Rate and Rhythm: Normal rate and regular rhythm.     Heart sounds: No murmur heard.   No gallop.  Pulmonary:     Effort: Pulmonary effort is normal. No respiratory distress.     Breath sounds: Normal breath sounds. No wheezing, rhonchi or rales.  Musculoskeletal:     Right lower leg: No edema.     Left lower leg: No edema.  Skin:    Findings: No rash.  Neurological:     Mental Status: He is alert.  Psychiatric:        Mood and Affect: Mood normal.        Behavior: Behavior normal.      Labs / Other Information My review of labs, imaging, notes and other tests is significant for Stable WBC 13.3, stable sodium 130, repeat blood  cultures no growth today     Time spent: 35 minutes [facility] 10/08/2021, 10:04 AM

## 2021-10-08 NOTE — Assessment & Plan Note (Signed)
--   Management per dietitian

## 2021-10-08 NOTE — Assessment & Plan Note (Signed)
--   Etiology unclear.  See above. -- Plan for TEE, EGD tomorrow, continue antibiotics per infectious disease.  Repeat cultures 10/11 no growth thus far

## 2021-10-09 ENCOUNTER — Encounter
Admission: EM | Disposition: A | Discharge status: Home Health | Payer: Self-pay | Source: Home / Self Care | Attending: Family Medicine

## 2021-10-09 ENCOUNTER — Inpatient Hospital Stay: Payer: BC Managed Care – PPO | Admitting: Anesthesiology

## 2021-10-09 ENCOUNTER — Encounter: Payer: Self-pay | Admitting: Internal Medicine

## 2021-10-09 ENCOUNTER — Other Ambulatory Visit: Payer: Self-pay

## 2021-10-09 ENCOUNTER — Inpatient Hospital Stay (HOSPITAL_COMMUNITY)
Admit: 2021-10-09 | Discharge: 2021-10-09 | Disposition: A | Discharge status: Home / Self Care | Payer: BC Managed Care – PPO | Attending: Physician Assistant | Admitting: Physician Assistant

## 2021-10-09 DIAGNOSIS — K253 Acute gastric ulcer without hemorrhage or perforation: Secondary | ICD-10-CM

## 2021-10-09 DIAGNOSIS — K269 Duodenal ulcer, unspecified as acute or chronic, without hemorrhage or perforation: Secondary | ICD-10-CM

## 2021-10-09 DIAGNOSIS — K209 Esophagitis, unspecified without bleeding: Secondary | ICD-10-CM

## 2021-10-09 DIAGNOSIS — K449 Diaphragmatic hernia without obstruction or gangrene: Secondary | ICD-10-CM

## 2021-10-09 DIAGNOSIS — R7881 Bacteremia: Secondary | ICD-10-CM

## 2021-10-09 DIAGNOSIS — I340 Nonrheumatic mitral (valve) insufficiency: Secondary | ICD-10-CM

## 2021-10-09 HISTORY — PX: ESOPHAGOGASTRODUODENOSCOPY (EGD) WITH PROPOFOL: SHX5813

## 2021-10-09 HISTORY — PX: TEE WITHOUT CARDIOVERSION: SHX5443

## 2021-10-09 LAB — URINE CULTURE: Culture: >=100000 — AB

## 2021-10-09 LAB — VANCOMYCIN, TROUGH: Vancomycin Tr: 13 ug/mL — ABNORMAL LOW (ref 15–20)

## 2021-10-09 LAB — GLUCOSE, CAPILLARY
Glucose-Capillary: 174 mg/dL — ABNORMAL HIGH (ref 70–99)
Glucose-Capillary: 222 mg/dL — ABNORMAL HIGH (ref 70–99)
Glucose-Capillary: 290 mg/dL — ABNORMAL HIGH (ref 70–99)
Glucose-Capillary: 222 mg/dL — ABNORMAL HIGH (ref 70–99)

## 2021-10-09 LAB — CREATININE, SERUM
Creatinine, Ser: 0.88 mg/dL (ref 0.61–1.24)
GFR, Estimated: >60 mL/min (ref >60)

## 2021-10-09 LAB — VANCOMYCIN, PEAK: Vancomycin Pk: 25 ug/mL — ABNORMAL LOW (ref 30–40)

## 2021-10-09 SURGERY — ECHOCARDIOGRAM, TRANSESOPHAGEAL
Anesthesia: Moderate Sedation

## 2021-10-09 SURGERY — ESOPHAGOGASTRODUODENOSCOPY (EGD) WITH PROPOFOL
Anesthesia: General

## 2021-10-09 MED ORDER — SODIUM CHLORIDE 0.9 % IV SOLN
8.0000 mg/kg | Freq: Every day | INTRAVENOUS | Status: DC
  Administered 2021-10-09: 22:00:00 650 mg via INTRAVENOUS
  Filled 2021-10-09 (×2): qty 13

## 2021-10-09 MED ORDER — PHENYLEPHRINE HCL (PRESSORS) 10 MG/ML IV SOLN
INTRAVENOUS | Status: DC | PRN
  Administered 2021-10-09 (×4): 50 ug via INTRAVENOUS

## 2021-10-09 MED ORDER — PROPOFOL 10 MG/ML IV BOLUS
INTRAVENOUS | Status: AC
  Filled 2021-10-09: qty 20

## 2021-10-09 MED ORDER — PANTOPRAZOLE SODIUM 40 MG PO TBEC
40.0000 mg | DELAYED_RELEASE_TABLET | Freq: Two times a day (BID) | ORAL | Status: DC
  Administered 2021-10-09 – 2021-10-11 (×5): 40 mg via ORAL
  Filled 2021-10-09 (×5): qty 1

## 2021-10-09 MED ORDER — LIDOCAINE HCL (PF) 2 % IJ SOLN
INTRAMUSCULAR | Status: AC
  Filled 2021-10-09: qty 5

## 2021-10-09 MED ORDER — PROPOFOL 500 MG/50ML IV EMUL
INTRAVENOUS | Status: DC | PRN
  Administered 2021-10-09: 150 ug/kg/min via INTRAVENOUS

## 2021-10-09 MED ORDER — PROPOFOL 500 MG/50ML IV EMUL
INTRAVENOUS | Status: AC
  Filled 2021-10-09: qty 50

## 2021-10-09 MED ORDER — PHENYLEPHRINE HCL (PRESSORS) 10 MG/ML IV SOLN
INTRAVENOUS | Status: AC
  Filled 2021-10-09: qty 1

## 2021-10-09 NOTE — Assessment & Plan Note (Signed)
--   Resolved, secondary to sepsis

## 2021-10-09 NOTE — Assessment & Plan Note (Signed)
--   Continue Foley catheter, follow-up with urology as an outpatient for cystoscopy

## 2021-10-09 NOTE — Progress Notes (Signed)
  Progress Note    Travis [name].   EQA:[id number]  DOB: September 15, 1951  DOA: 10/05/2021     4 Date of Service: 10/09/2021  70 year old man was at home with wife, ambulates with cane without problems, PMH diabetes, CLL, presented with fatigue. --September with AKI, secondary to urinary retention, discharged with in/out catheter instructions --October 2 presented with urinary retention, Foley catheter placed, plans for outpatient cystoscopy --This hospitalization presented with marked hyperglycemia, treated for DKA --Treated for sepsis, found to have MSRA bacteremia, imaging unrevealing.  Plan for EGD/TEE 10/13.  (EGD given history of esophageal abnormalities) continue antibiotics per infectious disease.  Once clear, home with PICC line and outpatient antibiotics.   Consults  ID Cardiology  GI   Assessment and Plan * Severe sepsis with acute organ dysfunction ([place]) -- Presented with AKI and acute metabolic encephalopathy.  Both resolved, sepsis symptomatology resolved.   --Secondary to MRSA bacteremia, unclear source.  No hardware or obvious injuries reported.  Imaging of the spine was unrevealing. -- Work-up as below.  Continue antibiotics.  MRSA bacteremia -- Etiology unclear.  See above. -- TEE unrevealing.  Blood culture from the 11th positive.  Blood cultures from 12 pending.  Diabetes mellitus type 2, uncomplicated (HCC) -- Stable, continue subcutaneous insulin long-acting, sliding scale insulin, meal coverage.  Can resume oral medications on discharge glipizide and metformin.  Esophagitis --LA grade B. PPI.  Hiatal hernia --f/u with surgery as an outpatient  Malnutrition of moderate degree -- Management per dietitian  Obstructive uropathy -- Continue Foley catheter, follow-up with urology as an outpatient for cystoscopy  DKA (diabetic ketoacidosis) ([place]) -- Resolved, secondary to sepsis.  Nodule of right lung -- Two small indistinct nodular opacities in the right  lung. Chest CTwith IV contrast recommended to evaluate for pulmonary nodules. Follow-up as an outpatient.  AKI (acute kidney injury) ([place]) -- Resolved, secondary to sepsis  CLL (chronic lymphocytic leukemia) ([place]) -- Stable, continue ibrutinib, supportive care   Subjective:  Feels fine today  Objective Vitals:   10/09/21 0939 10/09/21 0949 10/09/21 1215 10/09/21 1616  BP: 104/67 110/72 124/73 122/71  Pulse: 76 78 80 85  Resp: 20 16 16 16   Temp:   97.8 F (36.6 C) 97.7 F (36.5 C)  TempSrc:   Oral Oral  SpO2: 95% 97% 96% 96%  Weight:      Height:       82.1 kg  Vital signs were reviewed and unremarkable.   Exam Physical Exam Constitutional:      General: He is not in acute distress.    Appearance: He is not ill-appearing or toxic-appearing.  Cardiovascular:     Rate and Rhythm: Normal rate and regular rhythm.     Heart sounds: No murmur heard. Pulmonary:     Effort: Pulmonary effort is normal. No respiratory distress.     Breath sounds: No wheezing or rales.  Neurological:     Mental Status: He is alert.  Psychiatric:        Mood and Affect: Mood normal.        Behavior: Behavior normal.     Labs / Other Information Blood culture from 10/11 positive, blood culture from 10/12 pending. TEE discussed with performing physician.  CBG a bit higher today, will monitor.  Disposition Plan: Status is: Inpatient  Remains inpatient appropriate because: Still has MRSA bacteremia unclear  Time spent: 25 minutes [facility] 10/09/2021, 4:30 PM

## 2021-10-09 NOTE — Assessment & Plan Note (Signed)
--   Management per dietitian

## 2021-10-09 NOTE — Anesthesia Preprocedure Evaluation (Signed)
Anesthesia Evaluation  Patient identified by MRN, date of birth, ID band Patient awake    Reviewed: Allergy & Precautions, NPO status , Patient's Chart, lab work & pertinent test results  History of Anesthesia Complications Negative for: history of anesthetic complications  Airway Mallampati: II  TM Distance: >3 FB Neck ROM: Full    Dental no notable dental hx.    Pulmonary neg pulmonary ROS, neg sleep apnea, neg COPD,    breath sounds clear to auscultation- rhonchi (-) wheezing      Cardiovascular Exercise Tolerance: Good (-) hypertension(-) CAD, (-) Past MI, (-) Cardiac Stents and (-) CABG  Rhythm:Regular Rate:Normal - Systolic murmurs and - Diastolic murmurs    Neuro/Psych negative neurological ROS  negative psych ROS   GI/Hepatic negative GI ROS, Neg liver ROS,   Endo/Other  diabetes, Oral Hypoglycemic Agents  Renal/GU Renal disease     Musculoskeletal negative musculoskeletal ROS (+)   Abdominal (+) - obese,   Peds  Hematology CLL   Anesthesia Other Findings DM CLL Left ventricular ejection fraction, by estimation, is 60 to 65%   Reproductive/Obstetrics                             Anesthesia Physical  Anesthesia Plan  ASA: 3  Anesthesia Plan: General   Post-op Pain Management:    Induction: Intravenous  PONV Risk Score and Plan: 2 and Propofol infusion and TIVA  Airway Management Planned: Natural Airway and Nasal Cannula  Additional Equipment:   Intra-op Plan:   Post-operative Plan:   Informed Consent: I have reviewed the patients History and Physical, chart, labs and discussed the procedure including the risks, benefits and alternatives for the proposed anesthesia with the patient or authorized representative who has indicated his/her understanding and acceptance.     Dental advisory given  Plan Discussed with: CRNA, Anesthesiologist and Surgeon  Anesthesia  Plan Comments:         Anesthesia Quick Evaluation

## 2021-10-09 NOTE — Progress Notes (Signed)
*  PRELIMINARY RESULTS* Echocardiogram Echocardiogram Transesophageal has been performed.  Travis [name] 10/09/2021, 9:25 AM

## 2021-10-09 NOTE — Transfer of Care (Signed)
Immediate Anesthesia Transfer of Care Note  Patient: Travis [name].  Procedure(s) Performed: ESOPHAGOGASTRODUODENOSCOPY (EGD) WITH PROPOFOL TRANSESOPHAGEAL ECHOCARDIOGRAM (TEE)  Patient Location: PACU  Anesthesia Type:General  Level of Consciousness: awake and sedated  Airway & Oxygen Therapy: Patient Spontanous Breathing and Patient connected to nasal cannula oxygen  Post-op Assessment: Report given to RN and Post -op Vital signs reviewed and stable  Post vital signs: Reviewed and stable  Last Vitals:  Vitals Value Taken Time  BP    Temp    Pulse    Resp    SpO2      Last Pain:  Vitals:   10/09/21 0800  TempSrc: Temporal  PainSc: 0-No pain      Patients Stated Pain Goal: 0 (34/14/43 6016)  Complications: No notable events documented.

## 2021-10-09 NOTE — Assessment & Plan Note (Signed)
--  f/u with surgery as an outpatient

## 2021-10-09 NOTE — Assessment & Plan Note (Addendum)
--   Etiology unclear.  See above. -- TEE unrevealing.  Blood culture from the 11th positive.  Blood cultures from 12th NGTD -- PICC placed 10/14. Home on daptomycin

## 2021-10-09 NOTE — Assessment & Plan Note (Signed)
--   Stable, continue ibrutinib, supportive care

## 2021-10-09 NOTE — Progress Notes (Signed)
[facility] INFECTIOUS DISEASE PROGRESS NOTE Date of Admission:  10/05/2021     ID: Travis [name]. is a 70 y.o. male with  MRSA bacteremia Principal Problem:   Severe sepsis with acute organ dysfunction ([place]) Active Problems:   CLL (chronic lymphocytic leukemia) ([place])   Varicose veins of bilateral lower extremities with pain   Diabetes mellitus type 2, uncomplicated (HCC)   Neuropathy associated with cancer (HCC)   Vitamin D deficiency   AKI (acute kidney injury) ([place])   Nodule of right lung   Malnutrition of moderate degree   DKA (diabetic ketoacidosis) ([place])   Obstructive uropathy   MRSA bacteremia   Subjective: No fevers, wbc decreasing. TEE neg. Feels better, day by day. Eating [name]. No fevers. Some mild pain in T spine.  ROS  Eleven systems are reviewed and negative except per hpi  Medications:  Antibiotics Given (last 72 hours)     Date/Time Action Medication Dose Rate   10/06/21 1357 New Bag/Given   ceFEPIme (MAXIPIME) 2 g in sodium chloride 0.9 % 100 mL IVPB 2 g 200 mL/hr   10/06/21 1427 New Bag/Given   [MAR Hold] vancomycin (VANCOCIN) IVPB 1000 mg/200 mL premix (MAR Hold since Thu 10/09/2021 at 0752.Hold Reason: Transfer to a Procedural area) 1,000 mg 200 mL/hr   10/06/21 2356 New Bag/Given   [MAR Hold] vancomycin (VANCOCIN) IVPB 1000 mg/200 mL premix (MAR Hold since Thu 10/09/2021 at 0752.Hold Reason: Transfer to a Procedural area) 1,000 mg 200 mL/hr   10/07/21 1109 New Bag/Given   [MAR Hold] vancomycin (VANCOCIN) IVPB 1000 mg/200 mL premix (MAR Hold since Thu 10/09/2021 at 0752.Hold Reason: Transfer to a Procedural area) 1,000 mg 200 mL/hr   10/07/21 2249 New Bag/Given   [MAR Hold] vancomycin (VANCOCIN) IVPB 1000 mg/200 mL premix (MAR Hold since Thu 10/09/2021 at 0752.Hold Reason: Transfer to a Procedural area) 1,000 mg 200 mL/hr   10/08/21 0947 New Bag/Given   [MAR Hold] vancomycin (VANCOCIN) IVPB 1000 mg/200 mL premix (MAR Hold since Thu 10/09/2021 at  0752.Hold Reason: Transfer to a Procedural area) 1,000 mg 200 mL/hr   10/08/21 2150 New Bag/Given   [MAR Hold] vancomycin (VANCOCIN) IVPB 1000 mg/200 mL premix (MAR Hold since Thu 10/09/2021 at 0752.Hold Reason: Transfer to a Procedural area) 1,000 mg 200 mL/hr       [MAR Hold] Chlorhexidine Gluconate Cloth  6 each Topical Q0600   [MAR Hold] Chlorhexidine Gluconate Cloth  6 each Topical Q0600   [MAR Hold] citalopram  40 mg Oral Daily   [MAR Hold] enoxaparin (LOVENOX) injection  40 mg Subcutaneous Q24H   [MAR Hold] feeding supplement (NEPRO CARB STEADY)  237 mL Oral TID BM   [MAR Hold] ibrutinib  420 mg Oral Daily   [MAR Hold] insulin aspart  0-20 Units Subcutaneous TID WC   [MAR Hold] insulin aspart  0-5 Units Subcutaneous QHS   [MAR Hold] insulin aspart  8 Units Subcutaneous TID WC   [MAR Hold] insulin glargine-yfgn  15 Units Subcutaneous BID   [MAR Hold] multivitamin with minerals  1 tablet Oral Daily   [MAR Hold] mupirocin ointment  1 application Nasal BID   pantoprazole  40 mg Oral BID    Objective: Vital signs in last 24 hours: Temp:  [96.8 F (36 C)-98 F (36.7 C)] 96.9 F (36.1 C) (10/13 0919) Pulse Rate:  [71-90] 78 (10/13 0949) Resp:  [16-24] 16 (10/13 0949) BP: (104-118)/(65-83) 110/72 (10/13 0949) SpO2:  [93 %-98 %] 97 % (10/13 0949) Physical Exam  Constitutional: He is oriented to person, place, and time. Chronically ill apperaing HENT: anicteric Mouth/Throat: Oropharynx is clear and moist. No oropharyngeal exudate.  Cardiovascular: Normal rate, regular rhythm and normal heart sounds. Exam reveals no gallop and no friction rub.  No murmur heard.  Pulmonary/Chest: Effort normal and breath sounds normal. No respiratory distress. He has no wheezes.  Abdominal: Soft. Bowel sounds are normal. He exhibits no distension. There is no tenderness.  Lymphadenopathy:  He has no cervical adenopathy.  Neurological: He is alert and oriented to person, place, and time.  Skin:  Skin is warm and dry. No rash noted. No erythema.  Psychiatric: He has a normal mood and affect. His behavior is normal.     Lab Results Recent Labs    10/07/21 0645 10/08/21 0458  WBC 15.4* 13.3*  HGB 12.0* 11.5*  HCT 32.9* 33.5*  NA 129* 130*  K 3.7 3.5  CL 103 102  CO2 15* 21*  BUN 29* 26*  CREATININE 0.94 0.88    Microbiology: Results for orders placed or performed during the hospital encounter of 10/05/21  Urine Culture     Status: Abnormal   Collection Time: 10/05/21 12:52 PM   Specimen: Urine, Clean Catch  Result Value Ref Range Status   Specimen Description   Final    URINE, CLEAN CATCH Performed at [facility], [address] [place], [place] [address]    Special Requests   Final    NONE Performed at [facility], [address] [place], [place] [address]    Culture (A)  Final    >=100,000 COLONIES/mL METHICILLIN RESISTANT STAPHYLOCOCCUS AUREUS CORRECTED ON 10/11 AT 1452: PREVIOUSLY REPORTED AS MULTIPLE SPECIES PRESENT, SUGGEST RECOLLECTION 80,000 COLONIES/mL GROUP B STREP(S.AGALACTIAE)ISOLATED TESTING AGAINST S. AGALACTIAE NOT ROUTINELY PERFORMED DUE TO PREDICTABILITY OF AMP/PEN/VAN SUSCEPTIBILITY. 60,000 COLONIES/mL ENTEROCOCCUS FAECALIS    Report Status 10/09/2021 FINAL  Final   Organism ID, Bacteria METHICILLIN RESISTANT STAPHYLOCOCCUS AUREUS (A)  Final   Organism ID, Bacteria ENTEROCOCCUS FAECALIS (A)  Final      Susceptibility   Enterococcus faecalis - MIC*    AMPICILLIN <=2 SENSITIVE Sensitive     NITROFURANTOIN <=16 SENSITIVE Sensitive     VANCOMYCIN 2 SENSITIVE Sensitive     * 60,000 COLONIES/mL ENTEROCOCCUS FAECALIS   Methicillin resistant staphylococcus aureus - MIC*    CIPROFLOXACIN <=0.5 SENSITIVE Sensitive     GENTAMICIN <=0.5 SENSITIVE Sensitive     NITROFURANTOIN <=16 SENSITIVE Sensitive     OXACILLIN >=4 RESISTANT Resistant     TETRACYCLINE <=1 SENSITIVE Sensitive     VANCOMYCIN <=0.5 SENSITIVE Sensitive      TRIMETH/SULFA <=10 SENSITIVE Sensitive     CLINDAMYCIN <=0.25 SENSITIVE Sensitive     RIFAMPIN <=0.5 SENSITIVE Sensitive     Inducible Clindamycin NEGATIVE Sensitive     * >=100,000 COLONIES/mL METHICILLIN RESISTANT STAPHYLOCOCCUS AUREUS CORRECTED ON 10/11 AT 1452: PREVIOUSLY REPORTED AS MULTIPLE SPECIES PRESENT, SUGGEST RECOLLECTION  Culture, blood (routine x 2)     Status: Abnormal   Collection Time: 10/05/21 12:52 PM   Specimen: BLOOD  Result Value Ref Range Status   Specimen Description   Final    BLOOD RIGHT FA Performed at [facility], [address], [place], [place] [address]    Special Requests   Final    BOTTLES DRAWN AEROBIC AND ANAEROBIC Blood Culture adequate volume Performed at [facility], [address], [place], [place] [address]    Culture  Setup Time   Final  Organism ID to follow IN BOTH AEROBIC AND ANAEROBIC BOTTLES GRAM POSITIVE COCCI CRITICAL RESULT CALLED TO, READ BACK BY AND VERIFIED WITH: Lloyd Huger @ [id number] 10/06/21 lfd Performed at [facility], [address] [place], [place] [address]    Culture METHICILLIN RESISTANT STAPHYLOCOCCUS AUREUS (A)  Final   Report Status 10/08/2021 FINAL  Final   Organism ID, Bacteria METHICILLIN RESISTANT STAPHYLOCOCCUS AUREUS  Final      Susceptibility   Methicillin resistant staphylococcus aureus - MIC*    CIPROFLOXACIN <=0.5 SENSITIVE Sensitive     ERYTHROMYCIN 1 INTERMEDIATE Intermediate     GENTAMICIN <=0.5 SENSITIVE Sensitive     OXACILLIN >=4 RESISTANT Resistant     TETRACYCLINE <=1 SENSITIVE Sensitive     VANCOMYCIN <=0.5 SENSITIVE Sensitive     TRIMETH/SULFA <=10 SENSITIVE Sensitive     CLINDAMYCIN <=0.25 SENSITIVE Sensitive     RIFAMPIN <=0.5 SENSITIVE Sensitive     Inducible Clindamycin NEGATIVE Sensitive     * METHICILLIN RESISTANT STAPHYLOCOCCUS AUREUS  Culture, blood (routine x 2)     Status: Abnormal   Collection Time: 10/05/21 12:52 PM   Specimen: BLOOD  Result  Value Ref Range Status   Specimen Description   Final    BLOOD RIGHT AC Performed at [facility], [address], [place], [place] [address]    Special Requests   Final    BOTTLES DRAWN AEROBIC AND ANAEROBIC Blood Culture adequate volume Performed at [facility], [address] [place], [place] [address]    Culture  Setup Time   Final    IN BOTH AEROBIC AND ANAEROBIC BOTTLES GRAM POSITIVE COCCI CRITICAL RESULT CALLED TO, READ BACK BY AND VERIFIED WITH: Lloyd Huger @ [id number] 10/06/21 lfd Performed at [facility], [address] [place] [place],  [address]    Culture (A)  Final    STAPHYLOCOCCUS AUREUS SUSCEPTIBILITIES PERFORMED ON PREVIOUS CULTURE WITHIN THE LAST 5 DAYS. Performed at [facility], [place] [address] [place],  [address]    Report Status 10/08/2021 FINAL  Final  Resp Panel by RT-PCR (Flu A&B, Covid) Nasopharyngeal Swab     Status: None   Collection Time: 10/05/21 12:52 PM   Specimen: Nasopharyngeal Swab; Nasopharyngeal(NP) swabs in vial transport medium  Result Value Ref Range Status   SARS Coronavirus 2 by RT PCR NEGATIVE NEGATIVE Final    Comment: (NOTE) SARS-CoV-2 target nucleic acids are NOT DETECTED.  The SARS-CoV-2 RNA is generally detectable in upper respiratory specimens during the acute phase of infection. The lowest concentration of SARS-CoV-2 viral copies this assay can detect is 138 copies/mL. A negative result does not preclude SARS-Cov-2 infection and should not be used as the sole basis for treatment or other patient management decisions. A negative result may occur with  improper specimen collection/handling, submission of specimen other than nasopharyngeal swab, presence of viral mutation(s) within the areas targeted by this assay, and inadequate number of viral copies(<138 copies/mL). A negative result must be combined with clinical observations, patient history, and epidemiological information. The  expected result is Negative.  Fact Sheet for Patients:  EntrepreneurPulse.com.au  Fact Sheet for Healthcare Providers:  IncredibleEmployment.be  This test is no t yet approved or cleared by the [place] FDA and  has been authorized for detection and/or diagnosis of SARS-CoV-2 by FDA under an Emergency Use Authorization (EUA). This EUA will remain  in effect (meaning this test can be used) for the duration of the COVID-19 declaration under Section 564(b)(1) of  the Act, 21 U.S.C.section 360bbb-3(b)(1), unless the authorization is terminated  or revoked sooner.       Influenza A by PCR NEGATIVE NEGATIVE Final   Influenza B by PCR NEGATIVE NEGATIVE Final    Comment: (NOTE) The Xpert Xpress SARS-CoV-2/FLU/RSV plus assay is intended as an aid in the diagnosis of influenza from Nasopharyngeal swab specimens and should not be used as a sole basis for treatment. Nasal washings and aspirates are unacceptable for Xpert Xpress SARS-CoV-2/FLU/RSV testing.  Fact Sheet for Patients: EntrepreneurPulse.com.au  Fact Sheet for Healthcare Providers: IncredibleEmployment.be  This test is not yet approved or cleared by the [place] FDA and has been authorized for detection and/or diagnosis of SARS-CoV-2 by FDA under an Emergency Use Authorization (EUA). This EUA will remain in effect (meaning this test can be used) for the duration of the COVID-19 declaration under Section 564(b)(1) of the Act, 21 U.S.C. section 360bbb-3(b)(1), unless the authorization is terminated or revoked.  Performed at [facility], [address] [place], [place] [address]   Blood Culture ID Panel (Reflexed)     Status: Abnormal   Collection Time: 10/05/21 12:52 PM  Result Value Ref Range Status   Enterococcus faecalis NOT DETECTED NOT DETECTED Final   Enterococcus Faecium NOT DETECTED NOT DETECTED Final   Listeria  monocytogenes NOT DETECTED NOT DETECTED Final   Staphylococcus species DETECTED (A) NOT DETECTED Final    Comment: CRITICAL RESULT CALLED TO, READ BACK BY AND VERIFIED WITH: NATHAN BELUE @ [phone] 10/06/21 LFD    Staphylococcus aureus (BCID) DETECTED (A) NOT DETECTED Final    Comment: Methicillin (oxacillin)-resistant Staphylococcus aureus (MRSA). MRSA is predictably resistant to beta-lactam antibiotics (except ceftaroline). Preferred therapy is vancomycin unless clinically contraindicated. Patient requires contact precautions if  hospitalized. CRITICAL RESULT CALLED TO, READ BACK BY AND VERIFIED WITH: NATHAN BELUE @ [id number] 10/06/21 LFD    Staphylococcus epidermidis NOT DETECTED NOT DETECTED Final   Staphylococcus lugdunensis NOT DETECTED NOT DETECTED Final   Streptococcus species NOT DETECTED NOT DETECTED Final   Streptococcus agalactiae NOT DETECTED NOT DETECTED Final   Streptococcus pneumoniae NOT DETECTED NOT DETECTED Final   Streptococcus pyogenes NOT DETECTED NOT DETECTED Final   A.calcoaceticus-baumannii NOT DETECTED NOT DETECTED Final   Bacteroides fragilis NOT DETECTED NOT DETECTED Final   Enterobacterales NOT DETECTED NOT DETECTED Final   Enterobacter cloacae complex NOT DETECTED NOT DETECTED Final   Escherichia coli NOT DETECTED NOT DETECTED Final   Klebsiella aerogenes NOT DETECTED NOT DETECTED Final   Klebsiella oxytoca NOT DETECTED NOT DETECTED Final   Klebsiella pneumoniae NOT DETECTED NOT DETECTED Final   Proteus species NOT DETECTED NOT DETECTED Final   Salmonella species NOT DETECTED NOT DETECTED Final   Serratia marcescens NOT DETECTED NOT DETECTED Final   Haemophilus influenzae NOT DETECTED NOT DETECTED Final   Neisseria meningitidis NOT DETECTED NOT DETECTED Final   Pseudomonas aeruginosa NOT DETECTED NOT DETECTED Final   Stenotrophomonas maltophilia NOT DETECTED NOT DETECTED Final   Candida albicans NOT DETECTED NOT DETECTED Final   Candida auris NOT DETECTED NOT  DETECTED Final   Candida glabrata NOT DETECTED NOT DETECTED Final   Candida krusei NOT DETECTED NOT DETECTED Final   Candida parapsilosis NOT DETECTED NOT DETECTED Final   Candida tropicalis NOT DETECTED NOT DETECTED Final   Cryptococcus neoformans/gattii NOT DETECTED NOT DETECTED Final   Meth resistant mecA/C and MREJ DETECTED (A) NOT DETECTED Final    Comment: CRITICAL RESULT CALLED TO, READ BACK BY AND VERIFIED WITH: NATHAN BELUE @  [phone] 10/06/21 LFD Performed at [facility], [address] [place], [place] [address]   MRSA Next Gen by PCR, Nasal     Status: Abnormal   Collection Time: 10/05/21  3:53 PM   Specimen: Nasal Mucosa; Nasal Swab  Result Value Ref Range Status   MRSA by PCR Next Gen DETECTED (A) NOT DETECTED Final    Comment: CRITICAL RESULT CALLED TO, READ BACK BY AND VERIFIED WITH: MYRA FLOWERS 10/05/21 @ 1735 BY SB (NOTE) The GeneXpert MRSA Assay (FDA approved for NASAL specimens only), is one component of a comprehensive MRSA colonization surveillance program. It is not intended to diagnose MRSA infection nor to guide or monitor treatment for MRSA infections. Test performance is not FDA approved in patients less than 37 years old. Performed at [facility], [address] [place], [place] [address]   CULTURE, BLOOD (ROUTINE X 2) w Reflex to ID Panel     Status: None (Preliminary result)   Collection Time: 10/07/21 12:23 AM   Specimen: BLOOD  Result Value Ref Range Status   Specimen Description BLOOD LEFT HAND  Final   Special Requests   Final    BOTTLES DRAWN AEROBIC AND ANAEROBIC Blood Culture results may not be optimal due to an excessive volume of blood received in culture bottles   Culture   Final    NO GROWTH 2 DAYS Performed at [facility], [address], [place], [place] [address]    Report Status PENDING  Incomplete  CULTURE, BLOOD (ROUTINE X 2) w Reflex to ID Panel     Status: None (Preliminary result)   Collection  Time: 10/07/21 12:28 AM   Specimen: BLOOD  Result Value Ref Range Status   Specimen Description BLOOD LEFT FOREARM  Final   Special Requests   Final    BOTTLES DRAWN AEROBIC AND ANAEROBIC Blood Culture adequate volume   Culture   Final    NO GROWTH 2 DAYS Performed at [facility], [address] [place], [place] [address]    Report Status PENDING  Incomplete  CULTURE, BLOOD (ROUTINE X 2) w Reflex to ID Panel     Status: None (Preliminary result)   Collection Time: 10/07/21  7:00 AM   Specimen: BLOOD  Result Value Ref Range Status   Specimen Description BLOOD [facility]  Final   Special Requests BOTTLES DRAWN AEROBIC AND ANAEROBIC  BCAV  Final   Culture   Final    NO GROWTH 2 DAYS Performed at [facility], [address], [place], [place] [address]    Report Status PENDING  Incomplete  CULTURE, BLOOD (ROUTINE X 2) w Reflex to ID Panel     Status: Abnormal (Preliminary result)   Collection Time: 10/07/21  7:05 AM   Specimen: BLOOD  Result Value Ref Range Status   Specimen Description   Final    BLOOD  [facility] Performed at [facility], [address] [place], [place] [address]    Special Requests   Final    BOTTLES DRAWN AEROBIC AND ANAEROBIC  BCAV Performed at [facility], [address] [place], [place] [address]    Culture  Setup Time   Final    GRAM POSITIVE COCCI ANAEROBIC BOTTLE ONLY CRITICAL VALUE NOTED.  VALUE IS CONSISTENT WITH PREVIOUSLY REPORTED AND CALLED VALUE. Performed at [facility], [address] [place], [place] [address]    Culture (A)  Final    STAPHYLOCOCCUS AUREUS SUSCEPTIBILITIES PERFORMED ON PREVIOUS CULTURE  WITHIN THE LAST 5 DAYS. Performed at [facility], [place] [address] [place], [place] [address]    Report Status PENDING  Incomplete  Culture, blood (single) w Reflex to ID Panel     Status: None (Preliminary result)   Collection Time: 10/08/21  4:14 PM   Specimen: BLOOD  Result Value Ref  Range Status   Specimen Description BLOOD RIGHT ANTECUBITAL  Final   Special Requests   Final    BOTTLES DRAWN AEROBIC AND ANAEROBIC Blood Culture adequate volume   Culture   Final    NO GROWTH < 24 HOURS Performed at [facility], [address], [place], [place] [address]    Report Status PENDING  Incomplete    Studies/Results: No results found.  Assessment/Plan: Travis [name]. is a 70 y.o. male with medical history significant for CLL, non-insulin-dependent diabetes mellitus, depression/anxiety, recent issues with urinary retention requiring I and O cath who was admitted  to the emergency department for chief concerns of weakness, tiredness, and shortness of breath on 10/9. Found to have sepsis, elevated wbc and DKA. He had Rudy growing MRSA with urinary source. UCX also with MRSA, GB strep and enterococcus TTE neg FU BCX neg MRI neg for T and L spine and brain.  Recommendations. Cont vanco. Will  check if dapto will be covered by insurance for otpt use TEE.to evaluate for endocarditis done today and neg BCX 10/11 is still growing Staph aureus.  Repeat cx 10/12 pending.  If no growth at 2 hourcs can place picc line  I favor a 4 week IV course given immunocompromised, poorly controlled DM and first fu BCX were positive.  Thank you very much for the consult. Will follow with you.  Leonel Ramsay   10/09/2021, 10:11 AM

## 2021-10-09 NOTE — Anesthesia Procedure Notes (Signed)
Date/Time: 10/09/2021 8:37 AM Performed by: Vaughan Sine Pre-anesthesia Checklist: Patient identified, Emergency Drugs available, Suction available, Patient being monitored and Timeout performed Patient Re-evaluated:Patient Re-evaluated prior to induction Oxygen Delivery Method: Nasal cannula Preoxygenation: Pre-oxygenation with 100% oxygen Induction Type: IV induction Airway Equipment and Method: Bite block Placement Confirmation: positive ETCO2 and CO2 detector

## 2021-10-09 NOTE — Op Note (Signed)
[facility] Gastroenterology Patient Name: Travis [name] Procedure Date: 10/09/2021 8:05 AM MRN: [id number] Account #: [id number] Date of Birth: [birth year] Admit Type: Outpatient Age: 70 Room: [facility] ENDO ROOM 3 Gender: Male Note Status: Finalized Instrument Name: Upper Endoscope [id number] Procedure:             Upper GI endoscopy Indications:           Dysphagia Providers:             Isack Lavalley B. Bonna Gains MD, MD Referring MD:          Ramonita Lab, MD (Referring MD) Medicines:             Monitored Anesthesia Care Complications:         No immediate complications. Procedure:             Pre-Anesthesia Assessment:                        - The risks and benefits of the procedure and the                         sedation options and risks were discussed with the                         patient. All questions were answered and informed                         consent was obtained.                        - Patient identification and proposed procedure were                         verified prior to the procedure.                        - ASA Grade Assessment: III - A patient with severe                         systemic disease.                        After obtaining informed consent, the endoscope was                         passed under direct vision. Throughout the procedure,                         the patient's blood pressure, pulse, and oxygen                         saturations were monitored continuously. The Endoscope                         was introduced through the mouth, and advanced to the                         second part of duodenum. The upper GI endoscopy was  accomplished with ease. The patient tolerated the                         procedure well. Findings:      LA Grade B (one or more mucosal breaks greater than 5 mm, not extending       between the tops of two mucosal folds) esophagitis with no bleeding was       found at the  gastroesophageal junction.      One benign-appearing, intrinsic mild stenosis was found. The stenosis       was traversed. Dilation was not done due to presence of esophagitis.      The exam of the esophagus was otherwise normal.      Patchy mild mucosal changes characterized by erythema, erosion and       nodularity were found in the gastric antrum. Biopsies were taken with a       cold forceps for histology. Biopsies were obtained in the gastric body,       at the incisura and in the gastric antrum with cold forceps for       Helicobacter pylori testing.      A single 5 mm mucosal papule (nodule) with no bleeding and no stigmata       of recent bleeding was found in the gastric antrum. Biopsies were taken       with a cold forceps for histology.      A 1 cm hiatal hernia was present.      A few 4 to 5 mm sessile polyps with no bleeding and no stigmata of       recent bleeding were found in the gastric fundus and in the gastric       body. Biopsies were taken with a cold forceps for histology.      The exam of the stomach was otherwise normal.      Few non-bleeding duodenal ulcers with no stigmata of bleeding were found       in the duodenal bulb. The largest lesion was 5 mm in largest dimension.       Biopsies were taken with a cold forceps for histology.      A non-bleeding diverticulum was found in the second portion of the       duodenum.      The exam of the duodenum was otherwise normal. Impression:            - LA Grade B reflux esophagitis with no bleeding.                        - Benign-appearing esophageal stenosis.                        - Erythematous, eroded and nodular mucosa in the                         antrum. Biopsied.                        - A single mucosal papule (nodule) found in the                         stomach. Biopsied.                        -  1 cm hiatal hernia.                        - A few gastric polyps. Biopsied.                        -  Non-bleeding duodenal ulcers with no stigmata of                         bleeding. Biopsied.                        - Non-bleeding duodenal diverticulum.                        - Biopsies were obtained in the gastric body, at the                         incisura and in the gastric antrum. Recommendation:        - Await pathology results.                        - Take prescribed proton pump inhibitor or H2 blocker                         (antacid) medications 30 - 60 minutes before meals.                        - Avoid NSAIDs except Aspirin if medically indicated                         by PCP                        - Return to GI clinic with Dr. Vicente Males in 3-4 weeks and                         discuss repeat EGD for possible dilation once                         esophagitis resolves.                        - Resume previous diet.                        - Return patient to hospital ward for ongoing care.                        - The findings and recommendations were discussed with                         the patient.                        - The findings and recommendations were discussed with                         the patient's family. Procedure Code(s):     --- Professional ---  66440, Esophagogastroduodenoscopy, flexible,                         transoral; with biopsy, single or multiple Diagnosis Code(s):     --- Professional ---                        K21.00, Gastro-esophageal reflux disease with                         esophagitis, without bleeding                        K22.2, Esophageal obstruction                        K25.9, Gastric ulcer, unspecified as acute or chronic,                         without hemorrhage or perforation                        K31.89, Other diseases of stomach and duodenum                        K44.9, Diaphragmatic hernia without obstruction or                         gangrene                        K31.7, Polyp of stomach and  duodenum                        K26.9, Duodenal ulcer, unspecified as acute or                         chronic, without hemorrhage or perforation                        R13.10, Dysphagia, unspecified CPT copyright 2019 American Medical Association. All rights reserved. The codes documented in this report are preliminary and upon coder review may  be revised to meet current compliance requirements.  Vonda Antigua, MD Margretta Sidle B. Bonna Gains MD, MD 10/09/2021 9:13:50 AM This report has been signed electronically. Number of Addenda: 0 Note Initiated On: 10/09/2021 8:05 AM Estimated Blood Loss:  Estimated blood loss: none.      [facility]

## 2021-10-09 NOTE — Hospital Course (Addendum)
70 year old man lives at home with wife, ambulates with cane without problems, PMH diabetes, CLL, presented with fatigue. --September with AKI, secondary to urinary retention, discharged with in/out catheter instructions --October 2 presented with urinary retention, Foley catheter placed, plans for outpatient cystoscopy --10/9 admitted for lethargy, DKA, metabolic encephalopathy, sepsis --Treated for DKA w/ resolution, sepsis, found to have MSRA bacteremia, seen by ID, imaging unrevealing. TEE unremarkable.  (EGD performed given history of esophageal abnormalities). Plan for discharge home w/ IV abx 4 weeks, follow-up with ID.  Consults  ID Cardiology  GI

## 2021-10-09 NOTE — Progress Notes (Signed)
Transesophageal Echocardiogram :  Indication: Endocarditis, bacteremia, sepsis Requesting/ordering  physician:   Procedure: The patient was positioned supine on the left side, bite block provided. The patient had general sedation   Using digital technique an omniplane probe was advanced into the distal esophagus without incident.   Moderate sedation: 1. Sedation used: Propofol per anesthesia team   See report in EPIC  for complete details: In brief,  No Endocarditis/vegetation noted transgastric imaging revealed normal LV function with no RWMAs and no mural apical thrombus.  .  Estimated ejection fraction was 55%.  Right sided cardiac chambers were normal with no evidence of pulmonary hypertension.  Imaging of the septum showed no ASD or VSD Bubble study was positive for shunt 2D and color flow confirmed small PFO  The LA was well visualized in orthogonal views.  There was no spontaneous contrast and no thrombus in the LA and LA appendage   The descending thoracic aorta had diffuse moderate mural aortic debris with no evidence of aneurysmal dilation or disection   Ida Rogue 10/09/2021 10:21 AM

## 2021-10-09 NOTE — Assessment & Plan Note (Addendum)
--   Two small indistinct nodular opacities in the right lung. Chest CTwith IV contrast recommended to evaluate for pulmonary nodules. Follow-up as an outpatient.  Daughter is aware.

## 2021-10-09 NOTE — Assessment & Plan Note (Addendum)
--  LA grade B. PPI.  Follow-up with GI as an outpatient.

## 2021-10-09 NOTE — Assessment & Plan Note (Addendum)
--   Presented with AKI and acute metabolic encephalopathy.  Both resolved, sepsis symptomatology resolved.   --Secondary to MRSA bacteremia, unclear source.  No hardware or obvious injuries reported.  Imaging of the spine was unrevealing. -- Work-up as below.  Continue antibiotic on discharge

## 2021-10-09 NOTE — Progress Notes (Addendum)
Pharmacy Antibiotic Note  Travis Dy. is a 70 y.o. male admitted on 10/05/2021 with sepsis.  Patient recently experiencing massive urinary retention per Urology notes.  He has required placement of foley catheter or intermittent self-catheterization. Pharmacy has been consulted for vancomycin and cefepime dosing.   Today, 10/09/2021 Day #5 vancomycin  Afebrile WBC: improved to 13.3 Renal: SCr has improved to 0.88 10/9 Blood culture with 4/4 GPC, BCID = MRSA 10/9 Urine Culture MRSA 10/11 Blood cultures 1/9 bottles S aureus. Susceptibilities pending TTE: neg, MRI of thoracic and lumbar spine neg  Plan: Vancomycin Level Peak 10/13 @ 0019>  25 Trough 10/13 @ 11:17> 13.0 Continue vancomycin 1gm IV q12h AUC = 482.3 using Cmax 27.3, Cmin 14.2 AUC goal 400-600 Follow-up TEE and EGD result SCr in am  Monitor renal function, clinical course, and length of therapy and possible need for OPAT Follow-up ID recommendations   Height: 5\' 11"  (180.3 cm) Weight: 82.1 kg (181 lb) IBW/kg (Calculated) : 75.3  Temp (24hrs), Avg:97.4 F (36.3 C), Min:96.8 F (36 C), Max:98 F (36.7 C)  Recent Labs  Lab 10/05/21 1130 10/05/21 1252 10/05/21 1632 10/05/21 2035 10/06/21 0044 10/06/21 0546 10/07/21 0645 10/08/21 0458 10/09/21 0019 10/09/21 1106 10/09/21 1117  WBC 38.9* 32.7*  --   --   --  22.0* 15.4* 13.3*  --   --   --   CREATININE 1.68*  --  1.36*   < > 1.01 0.92 0.94 0.88  --  0.88  --   LATICACIDVEN  --  2.9* 2.2*  --   --   --   --   --   --   --   --   VANCOTROUGH  --   --   --   --   --   --   --   --   --   --  13*  VANCOPEAK  --   --   --   --   --   --   --   --  25*  --   --    < > = values in this interval not displayed.     Estimated Creatinine Clearance: 84.4 mL/min (by C-G formula based on SCr of 0.88 mg/dL).    No Known Allergies  Antimicrobials this admission: Vanc 10/9 >>  Cefepime 10/9 >> 10/10  Dose adjustments this admission: None  Microbiology  results: 10/9 BCx: 4/4 GPC, BCID = MRSA 10/9 UCx: MRSA 10/11 BCx: 1/9 GPC: S. aureus  Thank you for allowing pharmacy to be a part of this patient's care.  Asher Muir, PharmD Candidate 10/09/2021 11:54 AM  Doreene Eland, PharmD, BCPS.   Work Cell: [phone] 10/09/2021 12:26 PM

## 2021-10-09 NOTE — Assessment & Plan Note (Addendum)
--   Stable.  Discussed with patient and daughter at bedside, patient elected to begin insulin on discharge, he has familiarity with this, requested pen.  Requested to stop glipizide.  Continue metformin.  Discharged on 15 units twice daily which he has tolerated well here with good control.  Follow-up with PCP.  Discussed checking blood sugars and parameters to call physician.

## 2021-10-09 NOTE — Assessment & Plan Note (Signed)
--   Resolved, secondary to sepsis.

## 2021-10-09 NOTE — Anesthesia Postprocedure Evaluation (Signed)
Anesthesia Post Note  Patient: Travis [name].  Procedure(s) Performed: ESOPHAGOGASTRODUODENOSCOPY (EGD) WITH PROPOFOL TRANSESOPHAGEAL ECHOCARDIOGRAM (TEE)  Patient location during evaluation: Phase II Anesthesia Type: General Level of consciousness: awake and alert, awake and oriented Pain management: pain level controlled Vital Signs Assessment: post-procedure vital signs reviewed and stable Respiratory status: spontaneous breathing, nonlabored ventilation and respiratory function stable Cardiovascular status: blood pressure returned to baseline and stable Postop Assessment: no apparent nausea or vomiting Anesthetic complications: no   No notable events documented.   Last Vitals:  Vitals:   10/09/21 0939 10/09/21 0949  BP: 104/67 110/72  Pulse: 76 78  Resp: 20 16  Temp:    SpO2: 95% 97%    Last Pain:  Vitals:   10/09/21 0949  TempSrc:   PainSc: (P) 0-No pain                 Phill Mutter

## 2021-10-10 ENCOUNTER — Inpatient Hospital Stay: Payer: Self-pay

## 2021-10-10 ENCOUNTER — Encounter: Payer: Self-pay | Admitting: Gastroenterology

## 2021-10-10 LAB — CULTURE, BLOOD (ROUTINE X 2)

## 2021-10-10 LAB — GLUCOSE, CAPILLARY
Glucose-Capillary: 183 mg/dL — ABNORMAL HIGH (ref 70–99)
Glucose-Capillary: 163 mg/dL — ABNORMAL HIGH (ref 70–99)
Glucose-Capillary: 190 mg/dL — ABNORMAL HIGH (ref 70–99)
Glucose-Capillary: 188 mg/dL — ABNORMAL HIGH (ref 70–99)

## 2021-10-10 LAB — SURGICAL PATHOLOGY

## 2021-10-10 LAB — CK: Total CK: 12 U/L — ABNORMAL LOW (ref 49–397)

## 2021-10-10 MED ORDER — SODIUM CHLORIDE 0.9% FLUSH
10.0000 mL | Freq: Two times a day (BID) | INTRAVENOUS | Status: DC
  Administered 2021-10-10 – 2021-10-11 (×2): 10 mL

## 2021-10-10 MED ORDER — SODIUM CHLORIDE 0.9 % IV SOLN
8.0000 mg/kg | Freq: Every day | INTRAVENOUS | Status: DC
  Administered 2021-10-10 – 2021-10-11 (×2): 650 mg via INTRAVENOUS
  Filled 2021-10-10 (×3): qty 13

## 2021-10-10 MED ORDER — SODIUM CHLORIDE 0.9% FLUSH: 10.0000 mL | INTRAVENOUS | Status: DC | PRN

## 2021-10-10 NOTE — Progress Notes (Signed)
  Progress Note    Travis [name].   AST:[id number]  DOB: February 17, 1951  DOA: 10/05/2021     5 Date of Service: 10/10/2021   Clinical Course 70 year old man lives at home with wife, ambulates with cane without problems, PMH diabetes, CLL, presented with fatigue. --September with AKI, secondary to urinary retention, discharged with in/out catheter instructions --October 2 presented with urinary retention, Foley catheter placed, plans for outpatient cystoscopy --This hospitalization presented with marked hyperglycemia, treated for DKA --Treated for sepsis, found to have MSRA bacteremia, imaging unrevealing.  Plan for EGD/TEE 10/13.  (EGD given history of esophageal abnormalities) continue antibiotics per infectious disease.  Once clear, home with PICC line and outpatient antibiotics.   Consults  ID Cardiology  GI  Assessment and Plan * Severe sepsis with acute organ dysfunction ([place]) -- Presented with AKI and acute metabolic encephalopathy.  Both resolved, sepsis symptomatology resolved.   --Secondary to MRSA bacteremia, unclear source.  No hardware or obvious injuries reported.  Imaging of the spine was unrevealing. -- Work-up as below.  Continue antibiotics.  MRSA bacteremia -- Etiology unclear.  See above. -- TEE unrevealing.  Blood culture from the 11th positive.  Blood cultures from 12 pending.  Diabetes mellitus type 2, uncomplicated (HCC) -- Stable, continue subcutaneous insulin long-acting, sliding scale insulin, meal coverage.  Can resume oral medications on discharge glipizide and metformin.  Esophagitis --LA grade B. PPI.  Hiatal hernia --f/u with surgery as an outpatient  Malnutrition of moderate degree -- Management per dietitian  Obstructive uropathy -- Continue Foley catheter, follow-up with urology as an outpatient for cystoscopy  DKA (diabetic ketoacidosis) ([place]) -- Resolved, secondary to sepsis.  Nodule of right lung -- Two small indistinct nodular  opacities in the right lung. Chest CTwith IV contrast recommended to evaluate for pulmonary nodules. Follow-up as an outpatient.  AKI (acute kidney injury) ([place]) -- Resolved, secondary to sepsis  CLL (chronic lymphocytic leukemia) ([place]) -- Stable, continue ibrutinib, supportive care  Subjective:  Feels fine, no complaints  Objective Vitals:   10/09/21 1215 10/09/21 1616 10/10/21 0112 10/10/21 0358  BP: 124/73 122/71 116/74 112/75  Pulse: 80 85 73 73  Resp: 16 16  16   Temp: 97.8 F (36.6 C) 97.7 F (36.5 C) (!) 97.5 F (36.4 C) 97.6 F (36.4 C)  TempSrc: Oral Oral    SpO2: 96% 96% 96% 97%  Weight:      Height:       82.1 kg  Vital signs were reviewed and unremarkable.   Exam Physical Exam Constitutional:      General: He is not in acute distress.    Appearance: He is not ill-appearing or toxic-appearing.  Cardiovascular:     Rate and Rhythm: Normal rate and regular rhythm.     Heart sounds: No murmur heard. Pulmonary:     Effort: No respiratory distress.     Breath sounds: No wheezing, rhonchi or rales.  Neurological:     Mental Status: He is alert.  Psychiatric:        Mood and Affect: Mood normal.        Behavior: Behavior normal.    Labs / Other Information My review of labs, imaging, notes and other tests is significant for     CBG stable  Disposition Plan: Status is: Inpatient  Remains inpatient appropriate because: need 10/12 BC to be negative 48 hours so PICC can be placed  Time spent: 25 minutes [facility] 10/10/2021, 11:55 AM

## 2021-10-10 NOTE — Progress Notes (Signed)
PHARMACY CONSULT NOTE FOR:  OUTPATIENT  PARENTERAL ANTIBIOTIC THERAPY (OPAT)  Indication: MRSA bacteremia Regimen: daptomycin 650mg  IV q24h End date: 11/06/2021  IV antibiotic discharge orders are pended. To discharging provider:  please sign these orders via discharge navigator,  Select New Orders & click on the button choice - Manage This Unsigned Work.     Thank you for allowing pharmacy to be a part of this patient's care.  Doreene Eland, PharmD, BCPS.   Work Cell: [phone] 10/10/2021 1:40 PM

## 2021-10-10 NOTE — TOC Initial Note (Signed)
Transition of Care (TOC) - Initial/Assessment Note    Patient Details  Name: Travis [name]. MRN: [id number] Date of Birth: 1951/02/22  Transition of Care [facility]) CM/SW Contact:    Pete Pelt, RN Phone Number: 10/10/2021, 2:31 PM  Clinical Narrative:   Patient lives at home with spouse, who can assist him after discharge.  Patient is able to transport to appointments without concerns.  Patient is able to obtain medications and take them as directed without concerns as well.   Patient is to have PICC placed today and anticipates discharge with home IV antibiotics today.  Carolynn Sayers with [facility] aware, and will set up and provide teaching to patient and family.  Carolynn Sayers has arranged for [facility] home health to assist in caring for patient PICC dressing and labs.    Patient states he has no further concerns for TOC at this time.  TOC contact information provided.                Expected Discharge Plan: [place] Barriers to Discharge: Continued Medical Work up   Patient Goals and CMS Choice        Expected Discharge Plan and Services Expected Discharge Plan: [place]   Discharge Planning Services: CM Consult Post Acute Care Choice: Durable Medical Equipment, Home Health (IV antibiotics) Living arrangements for the past 2 months: Single Family Home                           HH Arranged: RN [place] Agency: [place] Date [facility] Agency Contacted: 10/10/21   Representative spoke with at New Site: [facility] contacted by Carolynn Sayers  Prior Living Arrangements/Services Living arrangements for the past 2 months: [place] with:: Self, Spouse Patient language and need for interpreter reviewed:: Yes (No interpreter required) Do you feel safe going back to the place where you live?: Yes      Need for Family Participation in Patient Care: Yes (Comment) Care giver support system in place?: Yes  (comment)   Criminal Activity/Legal Involvement Pertinent to Current Situation/Hospitalization: No - Comment as needed  Activities of Daily Living Home Assistive Devices/Equipment: None ADL Screening (condition at time of admission) Patient's cognitive ability adequate to safely complete daily activities?: Yes Is the patient deaf or have difficulty hearing?: No Does the patient have difficulty seeing, even when wearing glasses/contacts?: No Does the patient have difficulty concentrating, remembering, or making decisions?: No Patient able to express need for assistance with ADLs?: Yes Does the patient have difficulty dressing or bathing?: No Independently performs ADLs?: Yes (appropriate for developmental age) Does the patient have difficulty walking or climbing stairs?: No Weakness of Legs: None Weakness of Arms/Hands: None  Permission Sought/Granted Permission sought to share information with : Case Manager Permission granted to share information with : Yes, Verbal Permission Granted     Permission granted to share info w AGENCY: [facility], [facility]        Emotional Assessment Appearance:: Appears stated age Attitude/Demeanor/Rapport: Engaged, Gracious Affect (typically observed): Pleasant, Appropriate Orientation: : Oriented to Self, Oriented to Place, Oriented to Situation, Oriented to  Time Alcohol / Substance Use: Not Applicable Psych Involvement: No (comment)  Admission diagnosis:  Acidosis [E87.20] Weakness [R53.1] Hyperglycemia [R73.9] Leukocytosis, unspecified type [D72.829] Patient Active Problem List   Diagnosis Date Noted   Hiatal hernia 10/09/2021   Esophagitis 10/09/2021   DKA (diabetic  ketoacidosis) ([place]) 10/08/2021   Obstructive uropathy 10/08/2021   MRSA bacteremia 10/08/2021   Malnutrition of moderate degree 10/06/2021   Hyperglycemia 10/05/2021   AKI (acute kidney injury) ([place]) 10/05/2021   Hyperosmolar hyponatremia 10/05/2021    Severe sepsis with acute organ dysfunction ([place]) 10/05/2021   Nodule of right lung 10/05/2021   Onychomycosis of multiple toenails with type 2 diabetes mellitus ([place]) 07/11/2018   Diabetes mellitus type 2, uncomplicated ([place]) 97/67/3419   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 [place] [address] Phone: [phone] Fax: [phone] [place] [address] Phone: [phone] Fax: [phone]  [id number] - [place], [place] - [place] AT [place] [address] [place] [address] Phone: [phone] Fax: [phone]  [facility] [place] [place] [facility] [employer] [place] [address] Phone: [phone] Fax: [phone]     Social Determinants of Health (SDOH) Interventions    Readmission Risk Interventions No flowsheet data found.

## 2021-10-10 NOTE — Progress Notes (Signed)
[place] INFECTIOUS DISEASE PROGRESS NOTE Date of Admission:  10/05/2021     ID: Travis More. is a 70 y.o. male with  MRSA bacteremia Principal Problem:   Severe sepsis with acute organ dysfunction ([place]) Active Problems:   CLL (chronic lymphocytic leukemia) ([place])   Varicose veins of bilateral lower extremities with pain   Diabetes mellitus type 2, uncomplicated (HCC)   Neuropathy associated with cancer (HCC)   Vitamin D deficiency   AKI (acute kidney injury) ([place])   Nodule of right lung   Malnutrition of moderate degree   DKA (diabetic ketoacidosis) ([place])   Obstructive uropathy   MRSA bacteremia   Hiatal hernia   Esophagitis   Subjective: No fevers, wbc decreasing. TEE neg. Feels better, day by day. Very tired though  Daughter in room  ROS  Eleven systems are reviewed and negative except per hpi  Medications:  Antibiotics Given (last 72 hours)     Date/Time Action Medication Dose Rate   10/07/21 2249 New Bag/Given   vancomycin (VANCOCIN) IVPB 1000 mg/200 mL premix 1,000 mg 200 mL/hr   10/08/21 0947 New Bag/Given   vancomycin (VANCOCIN) IVPB 1000 mg/200 mL premix 1,000 mg 200 mL/hr   10/08/21 2150 New Bag/Given   vancomycin (VANCOCIN) IVPB 1000 mg/200 mL premix 1,000 mg 200 mL/hr   10/09/21 1510 New Bag/Given   vancomycin (VANCOCIN) IVPB 1000 mg/200 mL premix 1,000 mg 200 mL/hr   10/09/21 2203 New Bag/Given   DAPTOmycin (CUBICIN) 650 mg in sodium chloride 0.9 % IVPB 650 mg 126 mL/hr   10/09/21 2310 New Bag/Given   vancomycin (VANCOCIN) IVPB 1000 mg/200 mL premix 1,000 mg 200 mL/hr       Chlorhexidine Gluconate Cloth  6 each Topical Q0600   citalopram  40 mg Oral Daily   enoxaparin (LOVENOX) injection  40 mg Subcutaneous Q24H   feeding supplement (NEPRO CARB STEADY)  237 mL Oral TID BM   ibrutinib  420 mg Oral Daily   insulin aspart  0-20 Units Subcutaneous TID WC   insulin aspart  0-5 Units Subcutaneous QHS   insulin aspart  8 Units Subcutaneous TID  WC   insulin glargine-yfgn  15 Units Subcutaneous BID   multivitamin with minerals  1 tablet Oral Daily   mupirocin ointment  1 application Nasal BID   pantoprazole  40 mg Oral BID    Objective: Vital signs in last 24 hours: Temp:  [97.5 F (36.4 C)-97.7 F (36.5 C)] 97.6 F (36.4 C) (10/14 1200) Pulse Rate:  [73-85] 74 (10/14 1200) Resp:  [16-20] 20 (10/14 1200) BP: (112-122)/(70-75) 116/70 (10/14 1200) SpO2:  [96 %-97 %] 97 % (10/14 1200) Physical Exam  Constitutional: He is oriented to person, place, and time. Chronically ill apperaing HENT: anicteric Mouth/Throat: Oropharynx is clear and moist. No oropharyngeal exudate.  Cardiovascular: Normal rate, regular rhythm and normal heart sounds. Exam reveals no gallop and no friction rub.  No murmur heard.  Pulmonary/Chest: Effort normal and breath sounds normal. No respiratory distress. He has no wheezes.  Abdominal: Soft. Bowel sounds are normal. He exhibits no distension. There is no tenderness.  Lymphadenopathy:  He has no cervical adenopathy.  Neurological: He is alert and oriented to person, place, and time.  Skin: Skin is warm and dry. No rash noted. No erythema.  Psychiatric: He has a normal mood and affect. His behavior is normal.     Lab Results Recent Labs    10/08/21 0458 10/09/21 1106  WBC 13.3*  --  HGB 11.5*  --   HCT 33.5*  --   NA 130*  --   K 3.5  --   CL 102  --   CO2 21*  --   BUN 26*  --   CREATININE 0.88 0.88    Microbiology: Results for orders placed or performed during the hospital encounter of 10/05/21  Urine Culture     Status: Abnormal   Collection Time: 10/05/21 12:52 PM   Specimen: Urine, Clean Catch  Result Value Ref Range Status   Specimen Description   Final    URINE, CLEAN CATCH Performed at [facility], [address], [place], [place] [address]    Special Requests   Final    NONE Performed at [facility], [address], [place], [place] [address]     Culture (A)  Final    >=100,000 COLONIES/mL METHICILLIN RESISTANT STAPHYLOCOCCUS AUREUS CORRECTED ON 10/11 AT 1452: PREVIOUSLY REPORTED AS MULTIPLE SPECIES PRESENT, SUGGEST RECOLLECTION 80,000 COLONIES/mL GROUP B STREP(S.AGALACTIAE)ISOLATED TESTING AGAINST S. AGALACTIAE NOT ROUTINELY PERFORMED DUE TO PREDICTABILITY OF AMP/PEN/VAN SUSCEPTIBILITY. 60,000 COLONIES/mL ENTEROCOCCUS FAECALIS    Report Status 10/09/2021 FINAL  Final   Organism ID, Bacteria METHICILLIN RESISTANT STAPHYLOCOCCUS AUREUS (A)  Final   Organism ID, Bacteria ENTEROCOCCUS FAECALIS (A)  Final      Susceptibility   Enterococcus faecalis - MIC*    AMPICILLIN <=2 SENSITIVE Sensitive     NITROFURANTOIN <=16 SENSITIVE Sensitive     VANCOMYCIN 2 SENSITIVE Sensitive     * 60,000 COLONIES/mL ENTEROCOCCUS FAECALIS   Methicillin resistant staphylococcus aureus - MIC*    CIPROFLOXACIN <=0.5 SENSITIVE Sensitive     GENTAMICIN <=0.5 SENSITIVE Sensitive     NITROFURANTOIN <=16 SENSITIVE Sensitive     OXACILLIN >=4 RESISTANT Resistant     TETRACYCLINE <=1 SENSITIVE Sensitive     VANCOMYCIN <=0.5 SENSITIVE Sensitive     TRIMETH/SULFA <=10 SENSITIVE Sensitive     CLINDAMYCIN <=0.25 SENSITIVE Sensitive     RIFAMPIN <=0.5 SENSITIVE Sensitive     Inducible Clindamycin NEGATIVE Sensitive     * >=100,000 COLONIES/mL METHICILLIN RESISTANT STAPHYLOCOCCUS AUREUS CORRECTED ON 10/11 AT 1452: PREVIOUSLY REPORTED AS MULTIPLE SPECIES PRESENT, SUGGEST RECOLLECTION  Culture, blood (routine x 2)     Status: Abnormal   Collection Time: 10/05/21 12:52 PM   Specimen: BLOOD  Result Value Ref Range Status   Specimen Description   Final    BLOOD RIGHT FA Performed at [facility], [address], [place], [place] [address]    Special Requests   Final    BOTTLES DRAWN AEROBIC AND ANAEROBIC Blood Culture adequate volume Performed at [facility], [address] [place], [place] [address]    Culture  Setup Time   Final    Organism  ID to follow IN BOTH AEROBIC AND ANAEROBIC BOTTLES GRAM POSITIVE COCCI CRITICAL RESULT CALLED TO, READ BACK BY AND VERIFIED WITH: Lloyd Huger @ [id number] 10/06/21 lfd Performed at [facility], [address] [place], [place] [address]    Culture METHICILLIN RESISTANT STAPHYLOCOCCUS AUREUS (A)  Final   Report Status 10/08/2021 FINAL  Final   Organism ID, Bacteria METHICILLIN RESISTANT STAPHYLOCOCCUS AUREUS  Final      Susceptibility   Methicillin resistant staphylococcus aureus - MIC*    CIPROFLOXACIN <=0.5 SENSITIVE Sensitive     ERYTHROMYCIN 1 INTERMEDIATE Intermediate     GENTAMICIN <=0.5 SENSITIVE Sensitive     OXACILLIN >=4 RESISTANT Resistant     TETRACYCLINE <=1 SENSITIVE Sensitive  VANCOMYCIN <=0.5 SENSITIVE Sensitive     TRIMETH/SULFA <=10 SENSITIVE Sensitive     CLINDAMYCIN <=0.25 SENSITIVE Sensitive     RIFAMPIN <=0.5 SENSITIVE Sensitive     Inducible Clindamycin NEGATIVE Sensitive     * METHICILLIN RESISTANT STAPHYLOCOCCUS AUREUS  Culture, blood (routine x 2)     Status: Abnormal   Collection Time: 10/05/21 12:52 PM   Specimen: BLOOD  Result Value Ref Range Status   Specimen Description   Final    BLOOD RIGHT [facility] Performed at [facility], [address] [place], [place] [address]    Special Requests   Final    BOTTLES DRAWN AEROBIC AND ANAEROBIC Blood Culture adequate volume Performed at [facility], [address] [place], [place] [address]    Culture  Setup Time   Final    IN BOTH AEROBIC AND ANAEROBIC BOTTLES GRAM POSITIVE COCCI CRITICAL RESULT CALLED TO, READ BACK BY AND VERIFIED WITH: Lloyd Huger @ [id number] 10/06/21 lfd Performed at [facility], [address] [place], [place] [address]    Culture (A)  Final    STAPHYLOCOCCUS AUREUS SUSCEPTIBILITIES PERFORMED ON PREVIOUS CULTURE WITHIN THE LAST 5 DAYS. Performed at [facility], [place] [address] [place], [place] [address]    Report Status 10/08/2021 FINAL   Final  Resp Panel by RT-PCR (Flu A&B, Covid) Nasopharyngeal Swab     Status: None   Collection Time: 10/05/21 12:52 PM   Specimen: Nasopharyngeal Swab; Nasopharyngeal(NP) swabs in vial transport medium  Result Value Ref Range Status   SARS Coronavirus 2 by RT PCR NEGATIVE NEGATIVE Final    Comment: (NOTE) SARS-CoV-2 target nucleic acids are NOT DETECTED.  The SARS-CoV-2 RNA is generally detectable in upper respiratory specimens during the acute phase of infection. The lowest concentration of SARS-CoV-2 viral copies this assay can detect is 138 copies/mL. A negative result does not preclude SARS-Cov-2 infection and should not be used as the sole basis for treatment or other patient management decisions. A negative result may occur with  improper specimen collection/handling, submission of specimen other than nasopharyngeal swab, presence of viral mutation(s) within the areas targeted by this assay, and inadequate number of viral copies(<138 copies/mL). A negative result must be combined with clinical observations, patient history, and epidemiological information. The expected result is Negative.  Fact Sheet for Patients:  EntrepreneurPulse.com.au  Fact Sheet for Healthcare Providers:  IncredibleEmployment.be  This test is no t yet approved or cleared by the [place] FDA and  has been authorized for detection and/or diagnosis of SARS-CoV-2 by FDA under an Emergency Use Authorization (EUA). This EUA will remain  in effect (meaning this test can be used) for the duration of the COVID-19 declaration under Section 564(b)(1) of the Act, 21 U.S.C.section 360bbb-3(b)(1), unless the authorization is terminated  or revoked sooner.       Influenza A by PCR NEGATIVE NEGATIVE Final   Influenza B by PCR NEGATIVE NEGATIVE Final    Comment: (NOTE) The Xpert Xpress SARS-CoV-2/FLU/RSV plus assay is intended as an aid in the diagnosis of influenza from  Nasopharyngeal swab specimens and should not be used as a sole basis for treatment. Nasal washings and aspirates are unacceptable for Xpert Xpress SARS-CoV-2/FLU/RSV testing.  Fact Sheet for Patients: EntrepreneurPulse.com.au  Fact Sheet for Healthcare Providers: IncredibleEmployment.be  This test is not yet approved or cleared by the [place] FDA and has been authorized for detection and/or diagnosis of SARS-CoV-2 by FDA under an Emergency Use Authorization (EUA).  This EUA will remain in effect (meaning this test can be used) for the duration of the COVID-19 declaration under Section 564(b)(1) of the Act, 21 U.S.C. section 360bbb-3(b)(1), unless the authorization is terminated or revoked.  Performed at [facility], [address] [place], [place] [address]   Blood Culture ID Panel (Reflexed)     Status: Abnormal   Collection Time: 10/05/21 12:52 PM  Result Value Ref Range Status   Enterococcus faecalis NOT DETECTED NOT DETECTED Final   Enterococcus Faecium NOT DETECTED NOT DETECTED Final   Listeria monocytogenes NOT DETECTED NOT DETECTED Final   Staphylococcus species DETECTED (A) NOT DETECTED Final    Comment: CRITICAL RESULT CALLED TO, READ BACK BY AND VERIFIED WITH: NATHAN BELUE @ 3149 10/06/21 LFD    Staphylococcus aureus (BCID) DETECTED (A) NOT DETECTED Final    Comment: Methicillin (oxacillin)-resistant Staphylococcus aureus (MRSA). MRSA is predictably resistant to beta-lactam antibiotics (except ceftaroline). Preferred therapy is vancomycin unless clinically contraindicated. Patient requires contact precautions if  hospitalized. CRITICAL RESULT CALLED TO, READ BACK BY AND VERIFIED WITH: NATHAN BELUE @ 7026 10/06/21 LFD    Staphylococcus epidermidis NOT DETECTED NOT DETECTED Final   Staphylococcus lugdunensis NOT DETECTED NOT DETECTED Final   Streptococcus species NOT DETECTED NOT DETECTED Final   Streptococcus  agalactiae NOT DETECTED NOT DETECTED Final   Streptococcus pneumoniae NOT DETECTED NOT DETECTED Final   Streptococcus pyogenes NOT DETECTED NOT DETECTED Final   A.calcoaceticus-baumannii NOT DETECTED NOT DETECTED Final   Bacteroides fragilis NOT DETECTED NOT DETECTED Final   Enterobacterales NOT DETECTED NOT DETECTED Final   Enterobacter cloacae complex NOT DETECTED NOT DETECTED Final   Escherichia coli NOT DETECTED NOT DETECTED Final   Klebsiella aerogenes NOT DETECTED NOT DETECTED Final   Klebsiella oxytoca NOT DETECTED NOT DETECTED Final   Klebsiella pneumoniae NOT DETECTED NOT DETECTED Final   Proteus species NOT DETECTED NOT DETECTED Final   Salmonella species NOT DETECTED NOT DETECTED Final   Serratia marcescens NOT DETECTED NOT DETECTED Final   Haemophilus influenzae NOT DETECTED NOT DETECTED Final   Neisseria meningitidis NOT DETECTED NOT DETECTED Final   Pseudomonas aeruginosa NOT DETECTED NOT DETECTED Final   Stenotrophomonas maltophilia NOT DETECTED NOT DETECTED Final   Candida albicans NOT DETECTED NOT DETECTED Final   Candida auris NOT DETECTED NOT DETECTED Final   Candida glabrata NOT DETECTED NOT DETECTED Final   Candida krusei NOT DETECTED NOT DETECTED Final   Candida parapsilosis NOT DETECTED NOT DETECTED Final   Candida tropicalis NOT DETECTED NOT DETECTED Final   Cryptococcus neoformans/gattii NOT DETECTED NOT DETECTED Final   Meth resistant mecA/C and MREJ DETECTED (A) NOT DETECTED Final    Comment: CRITICAL RESULT CALLED TO, READ BACK BY AND VERIFIED WITHLloyd Huger @ 3785 10/06/21 LFD Performed at [facility], [address] [place], [place] [address]   MRSA Next Gen by PCR, Nasal     Status: Abnormal   Collection Time: 10/05/21  3:53 PM   Specimen: Nasal Mucosa; Nasal Swab  Result Value Ref Range Status   MRSA by PCR Next Gen DETECTED (A) NOT DETECTED Final    Comment: CRITICAL RESULT CALLED TO, READ BACK BY AND VERIFIED WITH: MYRA FLOWERS  10/05/21 @ 1735 BY SB (NOTE) The GeneXpert MRSA Assay (FDA approved for NASAL specimens only), is one component of a comprehensive MRSA colonization surveillance program. It is not intended to diagnose MRSA infection nor to guide or monitor treatment for MRSA infections. Test performance is not FDA approved  in patients less than 56 years old. Performed at [facility], [address] [place], [place] [address]   CULTURE, BLOOD (ROUTINE X 2) w Reflex to ID Panel     Status: None (Preliminary result)   Collection Time: 10/07/21 12:23 AM   Specimen: BLOOD  Result Value Ref Range Status   Specimen Description BLOOD LEFT HAND  Final   Special Requests   Final    BOTTLES DRAWN AEROBIC AND ANAEROBIC Blood Culture results may not be optimal due to an excessive volume of blood received in culture bottles   Culture   Final    NO GROWTH 3 DAYS Performed at [facility], [address] [place], [place] [address]    Report Status PENDING  Incomplete  CULTURE, BLOOD (ROUTINE X 2) w Reflex to ID Panel     Status: None (Preliminary result)   Collection Time: 10/07/21 12:28 AM   Specimen: BLOOD  Result Value Ref Range Status   Specimen Description BLOOD LEFT FOREARM  Final   Special Requests   Final    BOTTLES DRAWN AEROBIC AND ANAEROBIC Blood Culture adequate volume   Culture   Final    NO GROWTH 3 DAYS Performed at [facility], [address], [place], [place] [address]    Report Status PENDING  Incomplete  CULTURE, BLOOD (ROUTINE X 2) w Reflex to ID Panel     Status: None (Preliminary result)   Collection Time: 10/07/21  7:00 AM   Specimen: BLOOD  Result Value Ref Range Status   Specimen Description BLOOD [facility]  Final   Special Requests BOTTLES DRAWN AEROBIC AND ANAEROBIC  BCAV  Final   Culture   Final    NO GROWTH 3 DAYS Performed at [facility], [address] [place], [place] [address]    Report Status PENDING  Incomplete  CULTURE, BLOOD  (ROUTINE X 2) w Reflex to ID Panel     Status: Abnormal   Collection Time: 10/07/21  7:05 AM   Specimen: BLOOD  Result Value Ref Range Status   Specimen Description   Final    BLOOD  [facility] Performed at [facility], [address], [place], [place] [address]    Special Requests   Final    BOTTLES DRAWN AEROBIC AND ANAEROBIC  BCAV Performed at [facility], [address] [place], [place] [address]    Culture  Setup Time   Final    GRAM POSITIVE COCCI ANAEROBIC BOTTLE ONLY CRITICAL VALUE NOTED.  VALUE IS CONSISTENT WITH PREVIOUSLY REPORTED AND CALLED VALUE. Performed at [facility], [address] [place], [place] [address]    Culture (A)  Final    STAPHYLOCOCCUS AUREUS SUSCEPTIBILITIES PERFORMED ON PREVIOUS CULTURE WITHIN THE LAST 5 DAYS. Performed at [facility], [place] [address], [place], [place] [address]    Report Status 10/10/2021 FINAL  Final  Culture, blood (single) w Reflex to ID Panel     Status: None (Preliminary result)   Collection Time: 10/08/21  4:14 PM   Specimen: BLOOD  Result Value Ref Range Status   Specimen Description BLOOD RIGHT ANTECUBITAL  Final   Special Requests   Final    BOTTLES DRAWN AEROBIC AND ANAEROBIC Blood Culture adequate volume   Culture   Final    NO GROWTH 2 DAYS Performed at [facility], [address], [place],  [address]    Report Status PENDING  Incomplete    Studies/Results: ECHO TEE  Result Date: 10/09/2021    TRANSESOPHOGEAL ECHO REPORT   Patient Name:   Travis Solis. Date of Exam: 10/09/2021 Medical Rec #:  [id number]           Height:       71.0 in Accession #:    [id number]          Weight:       181.0 lb Date of Birth:  1951/03/10           BSA:          2.021 m Patient Age:    62 years            BP:           82/72 mmHg Patient Gender: M                   HR:           100 bpm. Exam Location:  [facility] Procedure: Transesophageal Echo, Color Doppler, Cardiac Doppler and  Saline            Contrast Bubble Study Indications:     R78.81 Bacteremia  History:         Patient has prior history of Echocardiogram examinations, most                  recent 10/06/2021. Risk Factors:Diabetes. Leukemia.  Sonographer:     Charmayne Sheer Referring Phys:  [id number] Robin Searing VISSER Diagnosing Phys: Ida Rogue MD PROCEDURE: The transesophogeal probe was passed without difficulty through the esophogus of the patient. Sedation performed by different physician. Image quality was excellent. The patient's vital signs; including heart rate, blood pressure, and oxygen saturation; remained stable throughout the procedure. The patient developed no complications during the procedure. IMPRESSIONS  1. No valve vegetation noted/no endocarditis  2. Left ventricular ejection fraction, by estimation, is 60 to 65%. The left ventricle has normal function. The left ventricle has no regional wall motion abnormalities.  3. Right ventricular systolic function is normal. The right ventricular size is normal.  4. The mitral valve is normal in structure. Mild mitral valve regurgitation. No evidence of mitral stenosis.  5. The aortic valve is normal in structure. Aortic valve regurgitation is trivial. No aortic stenosis is present.  6. No left atrial/left atrial appendage thrombus was detected.  7. There is mild dilatation of the ascending aorta, measuring 42 mm. There is mild dilatation of the aortic root, measuring 42 mm.  8. Evidence of atrial level shunting from PFO detected by color flow Doppler. Agitated saline contrast bubble study was positive with shunting observed within 3-6 cardiac cycles suggestive of interatrial shunt. There is a small patent foramen ovale. Conclusion(s)/Recommendation(s): Normal biventricular function without evidence of hemodynamically significant valvular heart disease. FINDINGS  Left Ventricle: Left ventricular ejection fraction, by estimation, is 60 to 65%. The left ventricle has normal  function. The left ventricle has no regional wall motion abnormalities. The left ventricular internal cavity size was normal in size. There is  no left ventricular hypertrophy. Right Ventricle: The right ventricular size is normal. No increase in right ventricular wall thickness. Right ventricular systolic function is normal. Left Atrium: Left atrial size was normal in size. No left atrial/left atrial appendage thrombus was detected. Right Atrium: Right atrial size was normal in size. Pericardium: There is no evidence of pericardial effusion. Mitral Valve: The mitral valve is normal in structure. Mild mitral valve regurgitation. No evidence of mitral valve stenosis. Tricuspid Valve:  The tricuspid valve is normal in structure. Tricuspid valve regurgitation is trivial. No evidence of tricuspid stenosis. Aortic Valve: The aortic valve is normal in structure. Aortic valve regurgitation is trivial. No aortic stenosis is present. Pulmonic Valve: The pulmonic valve was normal in structure. Pulmonic valve regurgitation is not visualized. No evidence of pulmonic stenosis. Aorta: The aortic root is normal in size and structure. There is mild dilatation of the ascending aorta, measuring 42 mm. There is mild dilatation of the aortic root, measuring 42 mm. Venous: The inferior vena cava was not well visualized. The inferior vena cava is normal in size with greater than 50% respiratory variability, suggesting right atrial pressure of 3 mmHg. IAS/Shunts: Evidence of atrial level shunting detected by color flow Doppler. Agitated saline contrast was given intravenously to evaluate for intracardiac shunting. Agitated saline contrast bubble study was positive with shunting observed within 3-6 cardiac cycles suggestive of interatrial shunt. A small patent foramen ovale is detected.   AORTA Ao Root diam: 4.20 cm Ida Rogue MD Electronically signed by Ida Rogue MD Signature Date/Time: 10/09/2021/6:43:51 PM    Final      Assessment/Plan: Travis More. is a 70 y.o. male with medical history significant for CLL, non-insulin-dependent diabetes mellitus, depression/anxiety, recent issues with urinary retention requiring I and O cath who was admitted  to the emergency department for chief concerns of weakness, tiredness, and shortness of breath on 10/9. Found to have sepsis, elevated wbc and DKA. He had [place] growing MRSA with urinary source. UCX also with MRSA, GB strep and enterococcus TTE neg FU BCX neg MRI neg for T and L spine and brain.  10/14 fu bcx 10/12 ngtd.  Recommendations. Cont daptomycin  TEE.to evaluate for endocarditis  neg BCX 10/11 is still growing Staph aureus.  Repeat cx 10/12 NGTD .  -an place picc line Will dc when stable on a 4 week IV course given immunocompromised, poorly controlled DM and first fu BCX were positive.  Thank you very much for the consult. Will follow with you.  Travis [name]   10/10/2021, 1:10 PM

## 2021-10-10 NOTE — Progress Notes (Signed)
Peripherally Inserted Central Catheter Placement  The IV Nurse has discussed with the patient and/or persons authorized to consent for the patient, the purpose of this procedure and the potential benefits and risks involved with this procedure.  The benefits include less needle sticks, lab draws from the catheter, and the patient may be discharged home with the catheter. Risks include, but not limited to, infection, bleeding, blood clot (thrombus formation), and puncture of an artery; nerve damage and irregular heartbeat and possibility to perform a PICC exchange if needed/ordered by physician.  Alternatives to this procedure were also discussed.  Bard Power PICC patient education guide, fact sheet on infection prevention and patient information card has been provided to patient /or left at bedside.    PICC Placement Documentation  PICC Single Lumen 95/32/02 Right Basilic 44 cm 1 cm (Active)  Indication for Insertion or Continuance of Line Home intravenous therapies (PICC only) 10/10/21 1930  Exposed Catheter (cm) 0 cm 10/10/21 1930  Site Assessment Clean;Dry;Intact 10/10/21 1930  Line Status Flushed;Saline locked;Blood return noted 10/10/21 1930  Dressing Type Transparent 10/10/21 1930  Dressing Status Clean;Dry;Intact 10/10/21 1930  Antimicrobial disc in place? Yes 10/10/21 1930  Safety Lock Not Applicable 33/43/56 8616  Line Care Connections checked and tightened 10/10/21 1930  Line Adjustment (NICU/IV Team Only) No 10/10/21 1930  Dressing Intervention New dressing 10/10/21 1930  Dressing Change Due 10/17/21 10/10/21 1930       Onika Gudiel, Nicolette Bang 10/10/2021, 7:32 PM

## 2021-10-10 NOTE — TOC Progression Note (Signed)
Transition of Care (TOC) - Progression Note    Patient Details  Name: Travis [name]. MRN: [id number] Date of Birth: Oct 01, 1951  Transition of Care [facility]) CM/SW [place], RN Phone Number: 10/10/2021, 2:27 PM  Clinical Narrative:   10/14 patient will go home with IV abx after PICC placement.  Carolynn Sayers will provide teaching to family.  [facility] home health to accept patient for [facility] nursing.         Expected Discharge Plan and Services                                                 Social Determinants of Health (SDOH) Interventions    Readmission Risk Interventions No flowsheet data found.

## 2021-10-10 NOTE — Assessment & Plan Note (Signed)
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 Grayland Ormond.

## 2021-10-10 NOTE — Progress Notes (Addendum)
Infectious Disease Long Term IV Antibiotic Orders Arihant Pennings. [birth year] 69 [id number] .insur  Diagnosis:  MRSA bacteremia from Urinary source. TEE neg  Culture results Results for orders placed or performed during the hospital encounter of 10/05/21  Urine Culture     Status: Abnormal   Collection Time: 10/05/21 12:52 PM   Specimen: Urine, Clean Catch  Result Value Ref Range Status   Specimen Description   Final    URINE, CLEAN CATCH Performed at [facility], [address], [place], [place] [address]    Special Requests   Final    NONE Performed at [facility], [address] [place], [place] [address]    Culture (A)  Final    >=100,000 COLONIES/mL METHICILLIN RESISTANT STAPHYLOCOCCUS AUREUS CORRECTED ON 10/11 AT 1452: PREVIOUSLY REPORTED AS MULTIPLE SPECIES PRESENT, SUGGEST RECOLLECTION 80,000 COLONIES/mL GROUP B STREP(S.AGALACTIAE)ISOLATED TESTING AGAINST S. AGALACTIAE NOT ROUTINELY PERFORMED DUE TO PREDICTABILITY OF AMP/PEN/VAN SUSCEPTIBILITY. 60,000 COLONIES/mL ENTEROCOCCUS FAECALIS    Report Status 10/09/2021 FINAL  Final   Organism ID, Bacteria METHICILLIN RESISTANT STAPHYLOCOCCUS AUREUS (A)  Final   Organism ID, Bacteria ENTEROCOCCUS FAECALIS (A)  Final      Susceptibility   Enterococcus faecalis - MIC*    AMPICILLIN <=2 SENSITIVE Sensitive     NITROFURANTOIN <=16 SENSITIVE Sensitive     VANCOMYCIN 2 SENSITIVE Sensitive     * 60,000 COLONIES/mL ENTEROCOCCUS FAECALIS   Methicillin resistant staphylococcus aureus - MIC*    CIPROFLOXACIN <=0.5 SENSITIVE Sensitive     GENTAMICIN <=0.5 SENSITIVE Sensitive     NITROFURANTOIN <=16 SENSITIVE Sensitive     OXACILLIN >=4 RESISTANT Resistant     TETRACYCLINE <=1 SENSITIVE Sensitive     VANCOMYCIN <=0.5 SENSITIVE Sensitive     TRIMETH/SULFA <=10 SENSITIVE Sensitive     CLINDAMYCIN <=0.25 SENSITIVE Sensitive     RIFAMPIN <=0.5 SENSITIVE Sensitive     Inducible Clindamycin NEGATIVE Sensitive     *  >=100,000 COLONIES/mL METHICILLIN RESISTANT STAPHYLOCOCCUS AUREUS CORRECTED ON 10/11 AT 1452: PREVIOUSLY REPORTED AS MULTIPLE SPECIES PRESENT, SUGGEST RECOLLECTION  Culture, blood (routine x 2)     Status: Abnormal   Collection Time: 10/05/21 12:52 PM   Specimen: BLOOD  Result Value Ref Range Status   Specimen Description   Final    BLOOD RIGHT FA Performed at [facility], [address], [place], [place] [address]    Special Requests   Final    BOTTLES DRAWN AEROBIC AND ANAEROBIC Blood Culture adequate volume Performed at [facility], [address] [place], [place] [address]    Culture  Setup Time   Final    Organism ID to follow IN BOTH AEROBIC AND ANAEROBIC BOTTLES GRAM POSITIVE COCCI CRITICAL RESULT CALLED TO, READ BACK BY AND VERIFIED WITH: Lloyd Huger @ 0160 10/06/21 lfd Performed at [facility], [address] [place], [place] [address]    Culture METHICILLIN RESISTANT STAPHYLOCOCCUS AUREUS (A)  Final   Report Status 10/08/2021 FINAL  Final   Organism ID, Bacteria METHICILLIN RESISTANT STAPHYLOCOCCUS AUREUS  Final      Susceptibility   Methicillin resistant staphylococcus aureus - MIC*    CIPROFLOXACIN <=0.5 SENSITIVE Sensitive     ERYTHROMYCIN 1 INTERMEDIATE Intermediate     GENTAMICIN <=0.5 SENSITIVE Sensitive     OXACILLIN >=4 RESISTANT Resistant     TETRACYCLINE <=1 SENSITIVE Sensitive     VANCOMYCIN <=0.5 SENSITIVE Sensitive     TRIMETH/SULFA <=10 SENSITIVE Sensitive     CLINDAMYCIN <=0.25 SENSITIVE  Sensitive     RIFAMPIN <=0.5 SENSITIVE Sensitive     Inducible Clindamycin NEGATIVE Sensitive     * METHICILLIN RESISTANT STAPHYLOCOCCUS AUREUS  Culture, blood (routine x 2)     Status: Abnormal   Collection Time: 10/05/21 12:52 PM   Specimen: BLOOD  Result Value Ref Range Status   Specimen Description   Final    BLOOD RIGHT [facility] Performed at [facility], [address], [place], [place] [address]    Special Requests    Final    BOTTLES DRAWN AEROBIC AND ANAEROBIC Blood Culture adequate volume Performed at [facility], [address] [place], [place] [address]    Culture  Setup Time   Final    IN BOTH AEROBIC AND ANAEROBIC BOTTLES GRAM POSITIVE COCCI CRITICAL RESULT CALLED TO, READ BACK BY AND VERIFIED WITH: Lloyd Huger @ 4696 10/06/21 lfd Performed at [facility], [address], [place], [place] [address]    Culture (A)  Final    STAPHYLOCOCCUS AUREUS SUSCEPTIBILITIES PERFORMED ON PREVIOUS CULTURE WITHIN THE LAST 5 DAYS. Performed at [facility], [place] [address] [place], [place] [address]    Report Status 10/08/2021 FINAL  Final  Resp Panel by RT-PCR (Flu A&B, Covid) Nasopharyngeal Swab     Status: None   Collection Time: 10/05/21 12:52 PM   Specimen: Nasopharyngeal Swab; Nasopharyngeal(NP) swabs in vial transport medium  Result Value Ref Range Status   SARS Coronavirus 2 by RT PCR NEGATIVE NEGATIVE Final    Comment: (NOTE) SARS-CoV-2 target nucleic acids are NOT DETECTED.  The SARS-CoV-2 RNA is generally detectable in upper respiratory specimens during the acute phase of infection. The lowest concentration of SARS-CoV-2 viral copies this assay can detect is 138 copies/mL. A negative result does not preclude SARS-Cov-2 infection and should not be used as the sole basis for treatment or other patient management decisions. A negative result may occur with  improper specimen collection/handling, submission of specimen other than nasopharyngeal swab, presence of viral mutation(s) within the areas targeted by this assay, and inadequate number of viral copies(<138 copies/mL). A negative result must be combined with clinical observations, patient history, and epidemiological information. The expected result is Negative.  Fact Sheet for Patients:  EntrepreneurPulse.com.au  Fact Sheet for Healthcare Providers:   IncredibleEmployment.be  This test is no t yet approved or cleared by the [place] FDA and  has been authorized for detection and/or diagnosis of SARS-CoV-2 by FDA under an Emergency Use Authorization (EUA). This EUA will remain  in effect (meaning this test can be used) for the duration of the COVID-19 declaration under Section 564(b)(1) of the Act, 21 U.S.C.section 360bbb-3(b)(1), unless the authorization is terminated  or revoked sooner.       Influenza A by PCR NEGATIVE NEGATIVE Final   Influenza B by PCR NEGATIVE NEGATIVE Final    Comment: (NOTE) The Xpert Xpress SARS-CoV-2/FLU/RSV plus assay is intended as an aid in the diagnosis of influenza from Nasopharyngeal swab specimens and should not be used as a sole basis for treatment. Nasal washings and aspirates are unacceptable for Xpert Xpress SARS-CoV-2/FLU/RSV testing.  Fact Sheet for Patients: EntrepreneurPulse.com.au  Fact Sheet for Healthcare Providers: IncredibleEmployment.be  This test is not yet approved or cleared by the [place] FDA and has been authorized for detection and/or diagnosis of SARS-CoV-2 by FDA under an Emergency Use Authorization (EUA). This EUA will remain in effect (meaning this test can be used) for the duration of the COVID-19 declaration  under Section 564(b)(1) of the Act, 21 U.S.C. section 360bbb-3(b)(1), unless the authorization is terminated or revoked.  Performed at [facility], [address] [place], [place] [address]   Blood Culture ID Panel (Reflexed)     Status: Abnormal   Collection Time: 10/05/21 12:52 PM  Result Value Ref Range Status   Enterococcus faecalis NOT DETECTED NOT DETECTED Final   Enterococcus Faecium NOT DETECTED NOT DETECTED Final   Listeria monocytogenes NOT DETECTED NOT DETECTED Final   Staphylococcus species DETECTED (A) NOT DETECTED Final    Comment: CRITICAL RESULT CALLED TO, READ  BACK BY AND VERIFIED WITH: NATHAN BELUE @ 0160 10/06/21 LFD    Staphylococcus aureus (BCID) DETECTED (A) NOT DETECTED Final    Comment: Methicillin (oxacillin)-resistant Staphylococcus aureus (MRSA). MRSA is predictably resistant to beta-lactam antibiotics (except ceftaroline). Preferred therapy is vancomycin unless clinically contraindicated. Patient requires contact precautions if  hospitalized. CRITICAL RESULT CALLED TO, READ BACK BY AND VERIFIED WITH: NATHAN BELUE @ 1093 10/06/21 LFD    Staphylococcus epidermidis NOT DETECTED NOT DETECTED Final   Staphylococcus lugdunensis NOT DETECTED NOT DETECTED Final   Streptococcus species NOT DETECTED NOT DETECTED Final   Streptococcus agalactiae NOT DETECTED NOT DETECTED Final   Streptococcus pneumoniae NOT DETECTED NOT DETECTED Final   Streptococcus pyogenes NOT DETECTED NOT DETECTED Final   A.calcoaceticus-baumannii NOT DETECTED NOT DETECTED Final   Bacteroides fragilis NOT DETECTED NOT DETECTED Final   Enterobacterales NOT DETECTED NOT DETECTED Final   Enterobacter cloacae complex NOT DETECTED NOT DETECTED Final   Escherichia coli NOT DETECTED NOT DETECTED Final   Klebsiella aerogenes NOT DETECTED NOT DETECTED Final   Klebsiella oxytoca NOT DETECTED NOT DETECTED Final   Klebsiella pneumoniae NOT DETECTED NOT DETECTED Final   Proteus species NOT DETECTED NOT DETECTED Final   Salmonella species NOT DETECTED NOT DETECTED Final   Serratia marcescens NOT DETECTED NOT DETECTED Final   Haemophilus influenzae NOT DETECTED NOT DETECTED Final   Neisseria meningitidis NOT DETECTED NOT DETECTED Final   Pseudomonas aeruginosa NOT DETECTED NOT DETECTED Final   Stenotrophomonas maltophilia NOT DETECTED NOT DETECTED Final   Candida albicans NOT DETECTED NOT DETECTED Final   Candida auris NOT DETECTED NOT DETECTED Final   Candida glabrata NOT DETECTED NOT DETECTED Final   Candida krusei NOT DETECTED NOT DETECTED Final   Candida parapsilosis NOT  DETECTED NOT DETECTED Final   Candida tropicalis NOT DETECTED NOT DETECTED Final   Cryptococcus neoformans/gattii NOT DETECTED NOT DETECTED Final   Meth resistant mecA/C and MREJ DETECTED (A) NOT DETECTED Final    Comment: CRITICAL RESULT CALLED TO, READ BACK BY AND VERIFIED WITHLloyd Huger @ 2355 10/06/21 LFD Performed at [facility], [address] [place], [place] [address]   MRSA Next Gen by PCR, Nasal     Status: Abnormal   Collection Time: 10/05/21  3:53 PM   Specimen: Nasal Mucosa; Nasal Swab  Result Value Ref Range Status   MRSA by PCR Next Gen DETECTED (A) NOT DETECTED Final    Comment: CRITICAL RESULT CALLED TO, READ BACK BY AND VERIFIED WITH: MYRA FLOWERS 10/05/21 @ 1735 BY SB (NOTE) The GeneXpert MRSA Assay (FDA approved for NASAL specimens only), is one component of a comprehensive MRSA colonization surveillance program. It is not intended to diagnose MRSA infection nor to guide or monitor treatment for MRSA infections. Test performance is not FDA approved in patients less than 62 years old. Performed at [facility], [address] [place], [place] [address]  CULTURE, BLOOD (ROUTINE X 2) w Reflex to ID Panel     Status: None (Preliminary result)   Collection Time: 10/07/21 12:23 AM   Specimen: BLOOD  Result Value Ref Range Status   Specimen Description BLOOD LEFT HAND  Final   Special Requests   Final    BOTTLES DRAWN AEROBIC AND ANAEROBIC Blood Culture results may not be optimal due to an excessive volume of blood received in culture bottles   Culture   Final    NO GROWTH 3 DAYS Performed at [facility], [address] [place], [place] [address]    Report Status PENDING  Incomplete  CULTURE, BLOOD (ROUTINE X 2) w Reflex to ID Panel     Status: None (Preliminary result)   Collection Time: 10/07/21 12:28 AM   Specimen: BLOOD  Result Value Ref Range Status   Specimen Description BLOOD LEFT FOREARM  Final   Special Requests    Final    BOTTLES DRAWN AEROBIC AND ANAEROBIC Blood Culture adequate volume   Culture   Final    NO GROWTH 3 DAYS Performed at [facility], [address], [place], [place] [address]    Report Status PENDING  Incomplete  CULTURE, BLOOD (ROUTINE X 2) w Reflex to ID Panel     Status: None (Preliminary result)   Collection Time: 10/07/21  7:00 AM   Specimen: BLOOD  Result Value Ref Range Status   Specimen Description BLOOD [facility]  Final   Special Requests BOTTLES DRAWN AEROBIC AND ANAEROBIC  BCAV  Final   Culture   Final    NO GROWTH 3 DAYS Performed at [facility], [address] [place], [place] [address]    Report Status PENDING  Incomplete  CULTURE, BLOOD (ROUTINE X 2) w Reflex to ID Panel     Status: Abnormal   Collection Time: 10/07/21  7:05 AM   Specimen: BLOOD  Result Value Ref Range Status   Specimen Description   Final    BLOOD  [place] Performed at [facility], [address] [place], [place] [address]    Special Requests   Final    BOTTLES DRAWN AEROBIC AND ANAEROBIC  BCAV Performed at [facility], [address] [place], [place] [address]    Culture  Setup Time   Final    GRAM POSITIVE COCCI ANAEROBIC BOTTLE ONLY CRITICAL VALUE NOTED.  VALUE IS CONSISTENT WITH PREVIOUSLY REPORTED AND CALLED VALUE. Performed at [facility], [address] [place], [place] [address]    Culture (A)  Final    STAPHYLOCOCCUS AUREUS SUSCEPTIBILITIES PERFORMED ON PREVIOUS CULTURE WITHIN THE LAST 5 DAYS. Performed at [facility], [place] [address], [place], [place] [address]    Report Status 10/10/2021 FINAL  Final  Culture, blood (single) w Reflex to ID Panel     Status: None (Preliminary result)   Collection Time: 10/08/21  4:14 PM   Specimen: BLOOD  Result Value Ref Range Status   Specimen Description BLOOD RIGHT ANTECUBITAL  Final   Special Requests   Final    BOTTLES DRAWN AEROBIC AND ANAEROBIC Blood Culture adequate volume    Culture   Final    NO GROWTH 2 DAYS Performed at [facility], [address] [place], [place] [address]    Report Status PENDING  Incomplete     LABS Lab Results  Component Value Date   CREATININE 0.88 10/09/2021   Lab Results  Component Value Date  WBC 13.3 (H) 10/08/2021   HGB 11.5 (L) 10/08/2021   HCT 33.5 (L) 10/08/2021   MCV 78.8 (L) 10/08/2021   PLT 238 10/08/2021   No results found for: ESRSEDRATE, POCTSEDRATE No results found for: CRP  Allergies: No Known Allergies  Discharge antibiotics  Daptomycin 650 mg every  24 hours   (weekly CPK)  PICC Care per protocol Labs weekly while on IV antibiotics -FAX weekly labs to [phone] CBC w diff   cr CPK CRP   Planned duration of antibiotics 4 weeks from 10/12  Stop date 11/10 Follow up clinic date  TBD   Leonel Ramsay, [facility] Account     Name Acct ID Class Status Primary Coverage   [place], [place] [id number] Inpatient Open [place] PPO           Guarantor Account (for Hospital Account [id number])     Name Relation to [place]? Acct Type   [name]. Self [facility] Yes Personal/Family    Address Phone       [address] [place], [place] [address] [phone])             Coverage Information (for Hospital Account [id number])     1. BLUE CROSS BLUE SHIELD/BCBS STATE HEALTH PPO     F/O Payor/Plan Precert #   BLUE CROSS [facility] PPO     Subscriber Subscriber #   Jakye, Mull[place] [id number]    Address Phone   [address] [place] [place], [place] [address] [phone]         2. [place] PART A     F/O Payor/Plan Precert #   MEDICARE/MEDICARE PART A     Subscriber Subscriber #   Rennie Plowman    Address Phone   [address] [place], [place] [address]

## 2021-10-11 LAB — GLUCOSE, CAPILLARY
Glucose-Capillary: 104 mg/dL — ABNORMAL HIGH (ref 70–99)
Glucose-Capillary: 156 mg/dL — ABNORMAL HIGH (ref 70–99)

## 2021-10-11 MED ORDER — PANTOPRAZOLE SODIUM 40 MG PO TBEC: 40.0000 mg | DELAYED_RELEASE_TABLET | Freq: Two times a day (BID) | ORAL | 2 refills | Status: DC

## 2021-10-11 MED ORDER — DAPTOMYCIN IV (FOR PTA / DISCHARGE USE ONLY): 650.0000 mg | INTRAVENOUS | 0 refills | Status: AC

## 2021-10-11 MED ORDER — INSULIN PEN NEEDLE 31G X 5 MM MISC: 2 refills | Status: AC

## 2021-10-11 MED ORDER — ADULT MULTIVITAMIN W/MINERALS CH: 1.0000 | ORAL_TABLET | Freq: Every day | ORAL | Status: AC

## 2021-10-11 MED ORDER — METFORMIN HCL 1000 MG PO TABS: 1000.0000 mg | ORAL_TABLET | Freq: Two times a day (BID) | ORAL | 0 refills | Status: DC

## 2021-10-11 MED ORDER — FREESTYLE LANCETS MISC: 2 refills | Status: AC

## 2021-10-11 MED ORDER — INSULIN GLARGINE 100 UNIT/ML SOLOSTAR PEN: 15.0000 [IU] | PEN_INJECTOR | Freq: Two times a day (BID) | SUBCUTANEOUS | 1 refills | Status: DC

## 2021-10-11 NOTE — Discharge Summary (Signed)
Physician Discharge Summary   Patient name: Albaraa Swingle.  Admit date:     10/05/2021  Discharge date: 10/11/2021  Discharge Physician: Murray Hodgkins   PCP: Adin Hector, MD   Recommendations at discharge:   MRSA bacteremia -- PICC placed 10/14. Home on daptomycin  Diabetes mellitus type 2, uncomplicated ([place]) -- Stable.  Discussed with patient and daughter at bedside, patient elected to begin insulin on discharge, he has familiarity with this, requested pen.  Requested to stop glipizide.  Continue metformin.  Discharged on 15 units twice daily which he has tolerated well here with good control.  Follow-up with PCP.  Discussed checking blood sugars and parameters to call physician.  Esophagitis --LA grade B. PPI.  Return to GI clinic with Dr. Vicente Males in 3-4 weeks and discuss repeat EGD for possible dilation once esophagitis resolves.  Obstructive uropathy -- Continue Foley catheter, follow-up with urology as an outpatient for cystoscopy  Nodule of right lung -- Two small indistinct nodular opacities in the right lung. Chest CTwith IV contrast recommended to evaluate for pulmonary nodules. Follow-up as an outpatient.  Daughter is aware.  Discharge Diagnoses Principal Problem:   Severe sepsis with acute organ dysfunction (HCC) Active Problems:   Varicose veins of bilateral lower extremities with pain   Neuropathy associated with cancer (HCC)   Vitamin D deficiency   MRSA bacteremia   Diabetes mellitus type 2, uncomplicated (HCC)   Malnutrition of moderate degree   Hiatal hernia   Esophagitis   CLL (chronic lymphocytic leukemia) (HCC)   AKI (acute kidney injury) ([place])   Nodule of right lung   DKA (diabetic ketoacidosis) ([place])   Obstructive uropathy   Resolved Diagnoses Resolved Problems:   * No resolved hospital problems. [facility] Course   70 year old man lives at home with wife, ambulates with cane without problems, PMH diabetes, CLL, presented with  fatigue. --September with AKI, secondary to urinary retention, discharged with in/out catheter instructions --October 2 presented with urinary retention, Foley catheter placed, plans for outpatient cystoscopy --10/9 admitted for lethargy, DKA, metabolic encephalopathy, sepsis --Treated for DKA w/ resolution, sepsis, found to have MSRA bacteremia, seen by ID, imaging unrevealing. TEE unremarkable.  (EGD performed given history of esophageal abnormalities). Plan for discharge home w/ IV abx 4 weeks, follow-up with ID.  Consults  ID Cardiology  GI  * Severe sepsis with acute organ dysfunction ([place]) -- Presented with AKI and acute metabolic encephalopathy.  Both resolved, sepsis symptomatology resolved.   --Secondary to MRSA bacteremia, unclear source.  No hardware or obvious injuries reported.  Imaging of the spine was unrevealing. -- Work-up as below.  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 Grayland Ormond.  MRSA bacteremia -- Etiology unclear.  See above. -- TEE unrevealing.  Blood culture from the 11th positive.  Blood cultures from 12th NGTD -- PICC placed 10/14. Home on daptomycin  Diabetes mellitus type 2, uncomplicated ([place]) -- Stable.  Discussed with patient and daughter at bedside, patient elected to begin insulin on discharge, he has familiarity with this, requested pen.  Requested to stop glipizide.  Continue metformin.  Discharged on 15 units twice daily which he has tolerated well here with good control.  Follow-up with PCP.  Discussed checking blood sugars and parameters to call physician.  Esophagitis --LA grade B. PPI.  Follow-up with GI as an outpatient.  Hiatal hernia --f/u with  surgery as an outpatient  Malnutrition of moderate degree -- Management per dietitian  Obstructive uropathy -- Continue Foley catheter, follow-up with urology as an  outpatient for cystoscopy  DKA (diabetic ketoacidosis) ([place]) -- Resolved, secondary to sepsis.  Nodule of right lung -- Two small indistinct nodular opacities in the right lung. Chest CTwith IV contrast recommended to evaluate for pulmonary nodules. Follow-up as an outpatient.  Daughter is aware.  AKI (acute kidney injury) ([place]) -- Resolved, secondary to sepsis  CLL (chronic lymphocytic leukemia) ([place]) -- Stable, continue ibrutinib, supportive care   Procedures performed:  TEE EGD  Condition at discharge: good  Exam Physical Exam Vitals reviewed.  Constitutional:      General: He is not in acute distress.    Appearance: He is not ill-appearing or toxic-appearing.  Cardiovascular:     Rate and Rhythm: Normal rate and regular rhythm.     Heart sounds: No murmur heard. Pulmonary:     Effort: Pulmonary effort is normal. No respiratory distress.     Breath sounds: No wheezing, rhonchi or rales.  Neurological:     Mental Status: He is alert.  Psychiatric:        Mood and Affect: Mood normal.        Behavior: Behavior normal.    Disposition: Home  Discharge time: greater than 30 minutes.  Follow-up Information     Leonel Ramsay, MD. Schedule an appointment as soon as possible for a visit in 3 day(s).   Specialty: Infectious Diseases Why: Patient to make own follow up appt due to office being closed Contact information: [place] [address] [phone]         Tama High III, MD. Schedule an appointment as soon as possible for a visit in 1 week(s).   Specialty: Internal Medicine Why: Patient to make ownfollow up appt due to office being closed Contact information: [place] [address] [phone]         [name], MD. Schedule an appointment as soon as possible for a visit in 3 week(s).   Specialty: Gastroenterology Contact information: [place]  [place] [address] [phone]                 Allergies as of 10/11/2021   No Known Allergies      Medication List     STOP taking these medications    glipiZIDE 2.5 MG 24 hr tablet Commonly known as: GLUCOTROL XL       TAKE these medications    acetaminophen 500 MG tablet Commonly known as: TYLENOL Take 500-1,000 mg by mouth every 6 (six) hours as needed for mild pain.   citalopram 20 MG tablet Commonly known as: CELEXA Take 40 mg by mouth daily.   daptomycin  IVPB Commonly known as: CUBICIN Inject 650 mg into the vein daily for 26 days. Indication:  MRSA bacteremia First Dose: Yes Last Day of Therapy:  11/06/2021 Labs - Once weekly:  CBC/D, SCR, CRP and CPK FAX weekly labs to [phone] Method of administration: IV Push Method of administration may be changed at the discretion of home infusion pharmacist based upon assessment of the patient and/or caregiver's ability to self-administer the medication ordered.   freestyle lancets Use as instructed   Imbruvica 420 MG Tabs Generic drug: ibrutinib TAKE 1 TABLET BY MOUTH DAILY.   insulin glargine 100 UNIT/ML Solostar Pen Commonly known as: LANTUS Inject 15 Units  into the skin 2 (two) times daily.   Insulin Pen Needle 31G X 5 MM Misc Use as directed   metFORMIN 1000 MG tablet Commonly known as: GLUCOPHAGE Take 1,000 mg by mouth 2 (two) times daily with a meal.   multivitamin with minerals Tabs tablet Take 1 tablet by mouth daily.   pantoprazole 40 MG tablet Commonly known as: PROTONIX Take 1 tablet (40 mg total) by mouth 2 (two) times daily.   Vitamin D3 25 MCG (1000 UT) Caps Take 1,000 Int'l Units by mouth daily.               Durable Medical Equipment  (From admission, onward)           Start     Ordered   10/11/21 1459  For home use only DME Walker rolling  Once       Question Answer Comment  Walker: With [place] Wheels   Patient needs a walker to treat with the following  condition Generalized weakness      10/11/21 1459              Discharge Care Instructions  (From admission, onward)           Start     Ordered   10/11/21 0000  Change dressing on IV access line weekly and PRN  (Home infusion instructions - [facility] )        10/11/21 1203            CT Head Wo Contrast  Result Date: 10/05/2021 CLINICAL DATA:  Increasing weakness since Friday EXAM: CT HEAD WITHOUT CONTRAST TECHNIQUE: Contiguous axial images were obtained from the base of the skull through the vertex without intravenous contrast. COMPARISON:  None. FINDINGS: Brain: No evidence of acute infarction, hemorrhage, hydrocephalus, extra-axial collection or mass lesion/mass effect. Vascular: No hyperdense vessel or unexpected calcification. Skull: No osseous abnormality. Sinuses/Orbits: Visualized paranasal sinuses are clear. Visualized mastoid sinuses are clear. Visualized orbits demonstrate no focal abnormality. Other: None IMPRESSION: No acute intracranial pathology. Electronically Signed   By: Kathreen Devoid M.D.   On: 10/05/2021 13:48   MR BRAIN WO CONTRAST  Result Date: 10/06/2021 CLINICAL DATA:  Acute neurologic deficit EXAM: MRI HEAD WITHOUT CONTRAST TECHNIQUE: Multiplanar, multiecho pulse sequences of the brain and surrounding structures were obtained without intravenous contrast. COMPARISON:  Head CT 10/05/2021 FINDINGS: Brain: No acute infarct, mass effect or extra-axial collection. No acute or chronic hemorrhage. There is multifocal hyperintense T2-weighted signal within the white matter. Parenchymal volume and CSF spaces are normal. 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 The midline structures are normal. Vascular: Major flow voids are preserved. Skull and upper cervical spine: Normal calvarium and skull base. Visualized upper cervical spine and soft tissues are normal. Sinuses/Orbits:No paranasal sinus fluid levels or advanced mucosal thickening. No  mastoid or middle ear effusion. Normal orbits. IMPRESSION: 1. No acute intracranial abnormality. 2. 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 Electronically Signed   By: Ulyses Jarred M.D.   On: 10/06/2021 00:28   MR THORACIC SPINE W WO CONTRAST  Result Date: 10/06/2021 CLINICAL DATA:  Intervertebral disc disorder EXAM: MRI THORACIC WITHOUT AND WITH CONTRAST TECHNIQUE: Multiplanar and multiecho pulse sequences of the thoracic spine were obtained without and with intravenous contrast. CONTRAST:  65mL GADAVIST GADOBUTROL 1 MMOL/ML IV SOLN COMPARISON:  None. FINDINGS: Alignment:  Physiologic. Vertebrae: No fracture, evidence of discitis, or bone lesion. Cord:  Normal signal and morphology. Paraspinal  and other soft tissues: Small pleural effusions Disc levels: No spinal canal stenosis. No contrast enhancement. IMPRESSION: 1. Normal thoracic spine. 2. Small pleural effusions. Electronically Signed   By: Ulyses Jarred M.D.   On: 10/06/2021 23:39   MR Lumbar Spine W Wo Contrast  Result Date: 10/06/2021 CLINICAL DATA:  MRSA bacteremia with back pain EXAM: MRI LUMBAR SPINE WITHOUT AND WITH CONTRAST TECHNIQUE: Multiplanar and multiecho pulse sequences of the lumbar spine were obtained without and with intravenous contrast. CONTRAST:  35mL GADAVIST GADOBUTROL 1 MMOL/ML IV SOLN COMPARISON:  None. FINDINGS: Segmentation:  Standard. Alignment:  Physiologic. Vertebrae:  No fracture, evidence of discitis, or bone lesion. Conus medullaris and cauda equina: Conus extends to the L1 level. Conus and cauda equina appear normal. Paraspinal and other soft tissues: Negative. Disc levels: The T12-L4 disc levels are normal. L4-5: Small disc bulge with mild facet hypertrophy. No spinal canal or neural foraminal stenosis. L5-S1: Small disc bulge with annular fissure. Mild facet hypertrophy. No spinal canal or neural foraminal stenosis. No abnormal contrast enhancement. IMPRESSION:  1. No epidural abscess or discitis/osteomyelitis. 2. Mild lower lumbar degenerative disc disease without spinal canal or neural foraminal stenosis. Electronically Signed   By: Ulyses Jarred M.D.   On: 10/06/2021 23:42   DG Chest Portable 1 View  Result Date: 10/05/2021 CLINICAL DATA:  Weakness EXAM: PORTABLE CHEST 1 VIEW COMPARISON:  None. FINDINGS: Normal heart size. Normal mediastinal contour. No pneumothorax. No pleural effusion. Two small indistinct nodular opacities in the right lung measuring 7 mm in the upper right lung and 8 mm in the lower right lung. Clear left lung. IMPRESSION: Two small indistinct nodular opacities in the right lung. Chest CT with IV contrast recommended to evaluate for pulmonary nodules. Electronically Signed   By: Ilona Sorrel M.D.   On: 10/05/2021 13:27   ECHOCARDIOGRAM COMPLETE  Result Date: 10/07/2021    ECHOCARDIOGRAM REPORT   Patient Name:   Bethel Gaglio. Date of Exam: 10/06/2021 Medical Rec #:  [id number]           Height:       71.0 in Accession #:    [id number]          Weight:       181.0 lb Date of Birth:  [birth year]           BSA:          2.021 m Patient Age:    42 years            BP:           122/69 mmHg Patient Gender: M                   HR:           92 bpm. Exam Location:  [facility] Procedure: 2D Echo, Color Doppler and Cardiac Doppler Indications:     R78.81 Bacteremia  History:         Patient has no prior history of Echocardiogram examinations.                  Risk Factors:Diabetes. Leukemia.  Sonographer:     Charmayne Sheer Referring Phys:  [id number] AMIN Diagnosing Phys: Serafina Royals MD  Sonographer Comments: Suboptimal parasternal window. IMPRESSIONS  1. Left ventricular ejection fraction, by estimation, is 60 to 65%. The left ventricle has normal function. The left ventricle has no regional wall motion abnormalities. Left ventricular diastolic parameters were normal.  2. Right  ventricular systolic function is normal. The right ventricular size is  normal.  3. The mitral valve is normal in structure. Mild mitral valve regurgitation.  4. The aortic valve is normal in structure. Aortic valve regurgitation is trivial.  5. Aortic dilatation noted. 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 FINDINGS  Left Ventricle: Left ventricular ejection fraction, by estimation, is 60 to 65%. The left ventricle has normal function. The left ventricle has no regional wall motion abnormalities. The left ventricular internal cavity size was small. There is no left ventricular hypertrophy. Left ventricular diastolic parameters were normal. Right Ventricle: The right ventricular size is normal. No increase in right ventricular wall thickness. Right ventricular systolic function is normal. Left Atrium: Left atrial size was normal in size. Right Atrium: Right atrial size was normal in size. Pericardium: There is no evidence of pericardial effusion. Mitral Valve: The mitral valve is normal in structure. Mild mitral valve regurgitation. MV peak gradient, 2.9 mmHg. The mean mitral valve gradient is 1.0 mmHg. Tricuspid Valve: The tricuspid valve is normal in structure. Tricuspid valve regurgitation is trivial. Aortic Valve: The aortic valve is normal in structure. Aortic valve regurgitation is trivial. Aortic valve mean gradient measures 4.0 mmHg. Aortic valve peak gradient measures 6.8 mmHg. Aortic valve area, by VTI measures 4.32 cm. Pulmonic Valve: The pulmonic valve was normal in structure. Pulmonic valve regurgitation is not visualized. Aorta: Aortic dilatation noted. 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 IAS/Shunts: No atrial level shunt detected by color flow Doppler.  LEFT VENTRICLE PLAX 2D LVIDd:         4.60 cm   Diastology LVIDs:         2.90 cm   LV e' medial:    6.96 cm/s LV PW:         0.80 cm   LV E/e' medial:  9.9 LV IVS:        0.60 cm   LV e' lateral:   11.00 cm/s  LVOT diam:     2.40 cm   LV E/e' lateral: 6.2 LV SV:         85 LV SV Index:   42 LVOT Area:     4.52 cm  RIGHT VENTRICLE RV Basal diam:  3.10 cm LEFT ATRIUM             Index        RIGHT ATRIUM          Index LA diam:        3.30 cm 1.63 cm/m   RA Area:     7.74 cm LA Vol (A2C):   27.4 ml 13.56 ml/m  RA Volume:   10.90 ml 5.39 ml/m LA Vol (A4C):   24.6 ml 12.17 ml/m LA Biplane Vol: 26.7 ml 13.21 ml/m  AORTIC VALVE                    PULMONIC VALVE AV Area (Vmax):    3.69 cm     PV Vmax:       1.18 m/s AV Area (Vmean):   3.75 cm     PV Vmean:      81.900 cm/s AV Area (VTI):     4.32 cm     PV VTI:        0.186 m AV Vmax:           130.00 cm/s  PV Peak grad:  5.6 mmHg AV Vmean:          93.600 cm/s  PV Mean grad:  3.0 mmHg AV VTI:            0.196 m AV Peak Grad:      6.8 mmHg AV Mean Grad:      4.0 mmHg LVOT Vmax:         106.00 cm/s LVOT Vmean:        77.500 cm/s LVOT VTI:          0.187 m LVOT/AV VTI ratio: 0.95  AORTA Ao Root diam: 4.40 cm MITRAL VALVE MV Area (PHT): 4.21 cm    SHUNTS MV Area VTI:   4.57 cm    Systemic VTI:  0.19 m MV Peak grad:  2.9 mmHg    Systemic Diam: 2.40 cm MV Mean grad:  1.0 mmHg MV Vmax:       0.85 m/s MV Vmean:      57.2 cm/s MV Decel Time: 180 msec MV E velocity: 68.60 cm/s MV A velocity: 66.40 cm/s MV E/A ratio:  1.03 Serafina Royals MD Electronically signed by Serafina Royals MD Signature Date/Time: 10/07/2021/12:24:52 PM    Final    ECHO TEE  Result Date: 10/09/2021    TRANSESOPHOGEAL ECHO REPORT   Patient Name:   [name]. Date of Exam: 10/09/2021 Medical Rec #:  [id number]           Height:       71.0 in Accession #:    [id number]          Weight:       181.0 lb Date of Birth:  01/28/1951           BSA:          2.021 m Patient Age:    59 years            BP:           82/72 mmHg Patient Gender: M                   HR:           100 bpm. Exam Location:  [facility] Procedure: Transesophageal Echo, Color Doppler, Cardiac Doppler and Saline            Contrast  Bubble Study Indications:     R78.81 Bacteremia  History:         Patient has prior history of Echocardiogram examinations, most                  recent 10/06/2021. Risk Factors:Diabetes. Leukemia.  Sonographer:     Charmayne Sheer Referring Phys:  [id number] Robin Searing VISSER Diagnosing Phys: Ida Rogue MD PROCEDURE: The transesophogeal probe was passed without difficulty through the esophogus of the patient. Sedation performed by different physician. Image quality was excellent. The patient's vital signs; including heart rate, blood pressure, and oxygen saturation; remained stable throughout the procedure. The patient developed no complications during the procedure. IMPRESSIONS  1. No valve vegetation noted/no endocarditis  2. Left ventricular ejection fraction, by estimation, is 60 to 65%. The left ventricle has normal function. The left ventricle has no regional wall motion abnormalities.  3. Right ventricular systolic function is normal. The right ventricular size is normal.  4. The mitral valve is normal in structure. Mild mitral valve regurgitation. No evidence of mitral stenosis.  5. The aortic valve is normal in structure. Aortic valve regurgitation  is trivial. No aortic stenosis is present.  6. No left atrial/left atrial appendage thrombus was detected.  7. There is mild dilatation of the ascending aorta, measuring 42 mm. There is mild dilatation of the aortic root, measuring 42 mm.  8. Evidence of atrial level shunting from PFO detected by color flow Doppler. Agitated saline contrast bubble study was positive with shunting observed within 3-6 cardiac cycles suggestive of interatrial shunt. There is a small patent foramen ovale. Conclusion(s)/Recommendation(s): Normal biventricular function without evidence of hemodynamically significant valvular heart disease. FINDINGS  Left Ventricle: Left ventricular ejection fraction, by estimation, is 60 to 65%. The left ventricle has normal function. The left ventricle  has no regional wall motion abnormalities. The left ventricular internal cavity size was normal in size. There is  no left ventricular hypertrophy. Right Ventricle: The right ventricular size is normal. No increase in right ventricular wall thickness. Right ventricular systolic function is normal. Left Atrium: Left atrial size was normal in size. No left atrial/left atrial appendage thrombus was detected. Right Atrium: Right atrial size was normal in size. Pericardium: There is no evidence of pericardial effusion. Mitral Valve: The mitral valve is normal in structure. Mild mitral valve regurgitation. No evidence of mitral valve stenosis. Tricuspid Valve: The tricuspid valve is normal in structure. Tricuspid valve regurgitation is trivial. No evidence of tricuspid stenosis. Aortic Valve: The aortic valve is normal in structure. Aortic valve regurgitation is trivial. No aortic stenosis is present. Pulmonic Valve: The pulmonic valve was normal in structure. Pulmonic valve regurgitation is not visualized. No evidence of pulmonic stenosis. Aorta: The aortic root is normal in size and structure. There is mild dilatation of the ascending aorta, measuring 42 mm. There is mild dilatation of the aortic root, measuring 42 mm. Venous: The inferior vena cava was not well visualized. The inferior vena cava is normal in size with greater than 50% respiratory variability, suggesting right atrial pressure of 3 mmHg. IAS/Shunts: Evidence of atrial level shunting detected by color flow Doppler. Agitated saline contrast was given intravenously to evaluate for intracardiac shunting. Agitated saline contrast bubble study was positive with shunting observed within 3-6 cardiac cycles suggestive of interatrial shunt. A small patent foramen ovale is detected.   AORTA Ao Root diam: 4.20 cm Ida Rogue MD Electronically signed by Ida Rogue MD Signature Date/Time: 10/09/2021/6:43:51 PM    Final    [place] EKG SITE RITE  Result Date:  10/10/2021 If Site Rite image not attached, placement could not be confirmed due to current cardiac rhythm.  Results for orders placed or performed during the hospital encounter of 10/05/21  Urine Culture     Status: Abnormal   Collection Time: 10/05/21 12:52 PM   Specimen: Urine, Clean Catch  Result Value Ref Range Status   Specimen Description   Final    URINE, CLEAN CATCH Performed at [facility], [address], [place], [place] [address]    Special Requests   Final    NONE Performed at [facility], [address] [place], [place] [address]    Culture (A)  Final    >=100,000 COLONIES/mL METHICILLIN RESISTANT STAPHYLOCOCCUS AUREUS CORRECTED ON 10/11 AT 1452: PREVIOUSLY REPORTED AS MULTIPLE SPECIES PRESENT, SUGGEST RECOLLECTION 80,000 COLONIES/mL GROUP B STREP(S.AGALACTIAE)ISOLATED TESTING AGAINST S. AGALACTIAE NOT ROUTINELY PERFORMED DUE TO PREDICTABILITY OF AMP/PEN/VAN SUSCEPTIBILITY. 60,000 COLONIES/mL ENTEROCOCCUS FAECALIS    Report Status 10/09/2021 FINAL  Final   Organism ID, Bacteria METHICILLIN RESISTANT STAPHYLOCOCCUS AUREUS (A)  Final   Organism ID, Bacteria  ENTEROCOCCUS FAECALIS (A)  Final      Susceptibility   Enterococcus faecalis - MIC*    AMPICILLIN <=2 SENSITIVE Sensitive     NITROFURANTOIN <=16 SENSITIVE Sensitive     VANCOMYCIN 2 SENSITIVE Sensitive     * 60,000 COLONIES/mL ENTEROCOCCUS FAECALIS   Methicillin resistant staphylococcus aureus - MIC*    CIPROFLOXACIN <=0.5 SENSITIVE Sensitive     GENTAMICIN <=0.5 SENSITIVE Sensitive     NITROFURANTOIN <=16 SENSITIVE Sensitive     OXACILLIN >=4 RESISTANT Resistant     TETRACYCLINE <=1 SENSITIVE Sensitive     VANCOMYCIN <=0.5 SENSITIVE Sensitive     TRIMETH/SULFA <=10 SENSITIVE Sensitive     CLINDAMYCIN <=0.25 SENSITIVE Sensitive     RIFAMPIN <=0.5 SENSITIVE Sensitive     Inducible Clindamycin NEGATIVE Sensitive     * >=100,000 COLONIES/mL METHICILLIN RESISTANT STAPHYLOCOCCUS AUREUS  CORRECTED ON 10/11 AT 1452: PREVIOUSLY REPORTED AS MULTIPLE SPECIES PRESENT, SUGGEST RECOLLECTION  Culture, blood (routine x 2)     Status: Abnormal   Collection Time: 10/05/21 12:52 PM   Specimen: BLOOD  Result Value Ref Range Status   Specimen Description   Final    BLOOD RIGHT FA Performed at [facility], [address] [place], [place] [address]    Special Requests   Final    BOTTLES DRAWN AEROBIC AND ANAEROBIC Blood Culture adequate volume Performed at [facility], [address] [place], [place] [address]    Culture  Setup Time   Final    Organism ID to follow IN BOTH AEROBIC AND ANAEROBIC BOTTLES GRAM POSITIVE COCCI CRITICAL RESULT CALLED TO, READ BACK BY AND VERIFIED WITH: Lloyd Huger @ [id number] 10/06/21 lfd Performed at [facility], [address], [place], [place] [address]    Culture METHICILLIN RESISTANT STAPHYLOCOCCUS AUREUS (A)  Final   Report Status 10/08/2021 FINAL  Final   Organism ID, Bacteria METHICILLIN RESISTANT STAPHYLOCOCCUS AUREUS  Final      Susceptibility   Methicillin resistant staphylococcus aureus - MIC*    CIPROFLOXACIN <=0.5 SENSITIVE Sensitive     ERYTHROMYCIN 1 INTERMEDIATE Intermediate     GENTAMICIN <=0.5 SENSITIVE Sensitive     OXACILLIN >=4 RESISTANT Resistant     TETRACYCLINE <=1 SENSITIVE Sensitive     VANCOMYCIN <=0.5 SENSITIVE Sensitive     TRIMETH/SULFA <=10 SENSITIVE Sensitive     CLINDAMYCIN <=0.25 SENSITIVE Sensitive     RIFAMPIN <=0.5 SENSITIVE Sensitive     Inducible Clindamycin NEGATIVE Sensitive     * METHICILLIN RESISTANT STAPHYLOCOCCUS AUREUS  Culture, blood (routine x 2)     Status: Abnormal   Collection Time: 10/05/21 12:52 PM   Specimen: BLOOD  Result Value Ref Range Status   Specimen Description   Final    BLOOD RIGHT AC Performed at [facility], [address] [place], [place] [address]    Special Requests   Final    BOTTLES DRAWN AEROBIC AND ANAEROBIC Blood Culture  adequate volume Performed at [facility] [address] [place], [place] [address]    Culture  Setup Time   Final    IN BOTH AEROBIC AND ANAEROBIC BOTTLES GRAM POSITIVE COCCI CRITICAL RESULT CALLED TO, READ BACK BY AND VERIFIED WITH: Lloyd Huger @ 9678 10/06/21 lfd Performed at [facility], [address] [place], [place] [address]    Culture (A)  Final    STAPHYLOCOCCUS AUREUS SUSCEPTIBILITIES PERFORMED ON PREVIOUS CULTURE WITHIN THE LAST 5 DAYS. Performed at [facility], [place]  [address] [place], [place] [address]    Report Status 10/08/2021 FINAL  Final  Resp Panel by RT-PCR (Flu A&B, Covid) Nasopharyngeal Swab     Status: None   Collection Time: 10/05/21 12:52 PM   Specimen: Nasopharyngeal Swab; Nasopharyngeal(NP) swabs in vial transport medium  Result Value Ref Range Status   SARS Coronavirus 2 by RT PCR NEGATIVE NEGATIVE Final    Comment: (NOTE) SARS-CoV-2 target nucleic acids are NOT DETECTED.  The SARS-CoV-2 RNA is generally detectable in upper respiratory specimens during the acute phase of infection. The lowest concentration of SARS-CoV-2 viral copies this assay can detect is 138 copies/mL. A negative result does not preclude SARS-Cov-2 infection and should not be used as the sole basis for treatment or other patient management decisions. A negative result may occur with  improper specimen collection/handling, submission of specimen other than nasopharyngeal swab, presence of viral mutation(s) within the areas targeted by this assay, and inadequate number of viral copies(<138 copies/mL). A negative result must be combined with clinical observations, patient history, and epidemiological information. The expected result is Negative.  Fact Sheet for Patients:  EntrepreneurPulse.com.au  Fact Sheet for Healthcare Providers:  IncredibleEmployment.be  This test is no t yet approved or cleared by the  [place] FDA and  has been authorized for detection and/or diagnosis of SARS-CoV-2 by FDA under an Emergency Use Authorization (EUA). This EUA will remain  in effect (meaning this test can be used) for the duration of the COVID-19 declaration under Section 564(b)(1) of the Act, 21 U.S.C.section 360bbb-3(b)(1), unless the authorization is terminated  or revoked sooner.       Influenza A by PCR NEGATIVE NEGATIVE Final   Influenza B by PCR NEGATIVE NEGATIVE Final    Comment: (NOTE) The Xpert Xpress SARS-CoV-2/FLU/RSV plus assay is intended as an aid in the diagnosis of influenza from Nasopharyngeal swab specimens and should not be used as a sole basis for treatment. Nasal washings and aspirates are unacceptable for Xpert Xpress SARS-CoV-2/FLU/RSV testing.  Fact Sheet for Patients: EntrepreneurPulse.com.au  Fact Sheet for Healthcare Providers: IncredibleEmployment.be  This test is not yet approved or cleared by the [place] FDA and has been authorized for detection and/or diagnosis of SARS-CoV-2 by FDA under an Emergency Use Authorization (EUA). This EUA will remain in effect (meaning this test can be used) for the duration of the COVID-19 declaration under Section 564(b)(1) of the Act, 21 U.S.C. section 360bbb-3(b)(1), unless the authorization is terminated or revoked.  Performed at [facility], [address] [place], [place] [address]   Blood Culture ID Panel (Reflexed)     Status: Abnormal   Collection Time: 10/05/21 12:52 PM  Result Value Ref Range Status   Enterococcus faecalis NOT DETECTED NOT DETECTED Final   Enterococcus Faecium NOT DETECTED NOT DETECTED Final   Listeria monocytogenes NOT DETECTED NOT DETECTED Final   Staphylococcus species DETECTED (A) NOT DETECTED Final    Comment: CRITICAL RESULT CALLED TO, READ BACK BY AND VERIFIED WITH: NATHAN BELUE @ 1497 10/06/21 LFD    Staphylococcus aureus (BCID)  DETECTED (A) NOT DETECTED Final    Comment: Methicillin (oxacillin)-resistant Staphylococcus aureus (MRSA). MRSA is predictably resistant to beta-lactam antibiotics (except ceftaroline). Preferred therapy is vancomycin unless clinically contraindicated. Patient requires contact precautions if  hospitalized. CRITICAL RESULT CALLED TO, READ BACK BY AND VERIFIED WITH: NATHAN BELUE @ 0263 10/06/21 LFD    Staphylococcus epidermidis NOT DETECTED NOT DETECTED Final   Staphylococcus lugdunensis NOT DETECTED NOT DETECTED Final  Streptococcus species NOT DETECTED NOT DETECTED Final   Streptococcus agalactiae NOT DETECTED NOT DETECTED Final   Streptococcus pneumoniae NOT DETECTED NOT DETECTED Final   Streptococcus pyogenes NOT DETECTED NOT DETECTED Final   A.calcoaceticus-baumannii NOT DETECTED NOT DETECTED Final   Bacteroides fragilis NOT DETECTED NOT DETECTED Final   Enterobacterales NOT DETECTED NOT DETECTED Final   Enterobacter cloacae complex NOT DETECTED NOT DETECTED Final   Escherichia coli NOT DETECTED NOT DETECTED Final   Klebsiella aerogenes NOT DETECTED NOT DETECTED Final   Klebsiella oxytoca NOT DETECTED NOT DETECTED Final   Klebsiella pneumoniae NOT DETECTED NOT DETECTED Final   Proteus species NOT DETECTED NOT DETECTED Final   Salmonella species NOT DETECTED NOT DETECTED Final   Serratia marcescens NOT DETECTED NOT DETECTED Final   Haemophilus influenzae NOT DETECTED NOT DETECTED Final   Neisseria meningitidis NOT DETECTED NOT DETECTED Final   Pseudomonas aeruginosa NOT DETECTED NOT DETECTED Final   Stenotrophomonas maltophilia NOT DETECTED NOT DETECTED Final   Candida albicans NOT DETECTED NOT DETECTED Final   Candida auris NOT DETECTED NOT DETECTED Final   Candida glabrata NOT DETECTED NOT DETECTED Final   Candida krusei NOT DETECTED NOT DETECTED Final   Candida parapsilosis NOT DETECTED NOT DETECTED Final   Candida tropicalis NOT DETECTED NOT DETECTED Final   Cryptococcus  neoformans/gattii NOT DETECTED NOT DETECTED Final   Meth resistant mecA/C and MREJ DETECTED (A) NOT DETECTED Final    Comment: CRITICAL RESULT CALLED TO, READ BACK BY AND VERIFIED WITHLloyd Huger @ 7106 10/06/21 LFD Performed at [facility], [address] [place], [place] [address]   MRSA Next Gen by PCR, Nasal     Status: Abnormal   Collection Time: 10/05/21  3:53 PM   Specimen: Nasal Mucosa; Nasal Swab  Result Value Ref Range Status   MRSA by PCR Next Gen DETECTED (A) NOT DETECTED Final    Comment: CRITICAL RESULT CALLED TO, READ BACK BY AND VERIFIED WITH: MYRA FLOWERS 10/05/21 @ 1735 BY SB (NOTE) The GeneXpert MRSA Assay (FDA approved for NASAL specimens only), is one component of a comprehensive MRSA colonization surveillance program. It is not intended to diagnose MRSA infection nor to guide or monitor treatment for MRSA infections. Test performance is not FDA approved in patients less than 27 years old. Performed at [facility], [address] [place], [place] [address]   CULTURE, BLOOD (ROUTINE X 2) w Reflex to ID Panel     Status: None (Preliminary result)   Collection Time: 10/07/21 12:23 AM   Specimen: BLOOD  Result Value Ref Range Status   Specimen Description BLOOD LEFT HAND  Final   Special Requests   Final    BOTTLES DRAWN AEROBIC AND ANAEROBIC Blood Culture results may not be optimal due to an excessive volume of blood received in culture bottles   Culture   Final    NO GROWTH 4 DAYS Performed at [facility], [address], [place],  [address]    Report Status PENDING  Incomplete  CULTURE, BLOOD (ROUTINE X 2) w Reflex to ID Panel     Status: None (Preliminary result)   Collection Time: 10/07/21 12:28 AM   Specimen: BLOOD  Result Value Ref Range Status   Specimen Description BLOOD LEFT FOREARM  Final   Special Requests   Final    BOTTLES DRAWN AEROBIC AND ANAEROBIC Blood Culture adequate volume   Culture   Final     NO GROWTH 4 DAYS Performed at [facility], [address], [place], [place] [address]    Report Status PENDING  Incomplete  CULTURE, BLOOD (ROUTINE X 2) w Reflex to ID Panel     Status: None (Preliminary result)   Collection Time: 10/07/21  7:00 AM   Specimen: BLOOD  Result Value Ref Range Status   Specimen Description   Final    BLOOD [facility]H Performed at [facility], [address], [place], [place] [address]    Special Requests   Final    BOTTLES DRAWN AEROBIC AND ANAEROBIC  BCAV Performed at [facility], [address] [place], [place] [address]    Culture  Setup Time   Final    GRAM POSITIVE COCCI ANAEROBIC BOTTLE ONLY CRITICAL VALUE NOTED.  VALUE IS CONSISTENT WITH PREVIOUSLY REPORTED AND CALLED VALUE.    Culture GRAM POSITIVE COCCI  Final   Report Status PENDING  Incomplete  CULTURE, BLOOD (ROUTINE X 2) w Reflex to ID Panel     Status: Abnormal   Collection Time: 10/07/21  7:05 AM   Specimen: BLOOD  Result Value Ref Range Status   Specimen Description   Final    BLOOD  [facility] Performed at [facility], [address] [place], [place] [address]    Special Requests   Final    BOTTLES DRAWN AEROBIC AND ANAEROBIC  BCAV Performed at [facility], [address], [place], [place] [address]    Culture  Setup Time   Final    GRAM POSITIVE COCCI ANAEROBIC BOTTLE ONLY CRITICAL VALUE NOTED.  VALUE IS CONSISTENT WITH PREVIOUSLY REPORTED AND CALLED VALUE. Performed at [facility], [address] [place], [place] [address]    Culture (A)  Final    STAPHYLOCOCCUS AUREUS SUSCEPTIBILITIES PERFORMED ON PREVIOUS CULTURE WITHIN THE LAST 5 DAYS. Performed at [facility], [place] [address] [place], [place] [address]    Report Status 10/10/2021 FINAL  Final  Culture, blood (single) w Reflex to ID Panel     Status: None (Preliminary result)   Collection Time: 10/08/21  4:14 PM   Specimen: BLOOD  Result Value Ref Range  Status   Specimen Description BLOOD RIGHT ANTECUBITAL  Final   Special Requests   Final    BOTTLES DRAWN AEROBIC AND ANAEROBIC Blood Culture adequate volume   Culture   Final    NO GROWTH 3 DAYS Performed at [facility], [address] [place], [place] [address]    Report Status PENDING  Incomplete    Signed:  Murray Hodgkins MD.  [facility] 10/11/2021, 3:11 PM

## 2021-10-11 NOTE — Progress Notes (Addendum)
Travis [name]. to be D/C'd  per MD order.  Discussed with the patient and all questions fully answered.  VSS, Skin clean, dry and intact without evidence of skin break down, no evidence of skin tears noted.  PICC line left intact per order. Site without signs and symptoms of complications. Urinary catheter left intact per order with leg bag attached.  An After Visit Summary was printed and given to the patient. Patient received prescription. Home Meds returned from pharmacy and given back to the patient.  D/c education completed with patient/family including follow up instructions, medication list, d/c activities limitations if indicated, with other d/c instructions as indicated by MD - patient able to verbalize understanding, all questions fully answered.   Patient instructed to return to ED, call 911, or call MD for any changes in condition.   Patient to be escorted via [place], and D/C home via private auto.

## 2021-10-11 NOTE — TOC Transition Note (Signed)
Transition of Care [facility]) - CM/SW Discharge Note   Patient Details  Name: Travis [name]. MRN: [id number] Date of Birth: 29-Feb-1951  [facility] CM/SW Contact:  Elliot Gurney [place], [place] Phone [place] 10/11/2021, 8:46 AM   Clinical Narrative:    Per [place] infusions-patient medications to be delivered at  3 pm for home dose at 5-6 pm. [facility] is nursing and will see patient on Monday. Teaching has been done.  [place], LCSW Transition of Care [phone]    Final next level of care: [place] Barriers to Discharge: Continued Medical Work up   Patient Goals and CMS Choice        Discharge Placement                       Discharge Plan and Services   Discharge Planning Services: CM Consult Post Acute Care Choice: Durable Medical Equipment, Home Health (IV antibiotics)                    HH Arranged: RN [facility] Agency: [place] Date [place]: 10/10/21   Representative spoke with at [place]: [facility] contacted by Carolynn Sayers  Social Determinants of Health (SDOH) Interventions     Readmission Risk Interventions No flowsheet data found.

## 2021-10-12 LAB — CULTURE, BLOOD (ROUTINE X 2)
Culture: NO GROWTH
Culture: NO GROWTH
Special Requests: ADEQUATE

## 2021-10-13 ENCOUNTER — Telehealth: Payer: Self-pay

## 2021-10-13 LAB — CULTURE, BLOOD (ROUTINE X 2)

## 2021-10-13 LAB — CULTURE, BLOOD (SINGLE)
Culture: NO GROWTH
Special Requests: ADEQUATE

## 2021-10-13 NOTE — Telephone Encounter (Signed)
-----   Message from Virgel Manifold, MD sent at 10/10/2021  2:00 PM EDT ----- Please make hospital follow-up with Dr. Vicente Males for dysphagia

## 2021-10-13 NOTE — Telephone Encounter (Signed)
Patient was contacted and left him a voicemail to call me back to schedule him an appointment to come I and talk to Dr. Vicente Males.

## 2021-10-14 ENCOUNTER — Encounter: Payer: Self-pay | Admitting: Gastroenterology

## 2021-10-15 ENCOUNTER — Telehealth: Payer: Self-pay

## 2021-10-15 NOTE — Telephone Encounter (Signed)
Patient has an appointment with Dr.Anna on 11/12/2021.

## 2021-10-15 NOTE — Telephone Encounter (Signed)
Being on abx is actually the safest time to perform cysto.  Yes, lets continue as scheduled.  Hollice Espy, MD

## 2021-10-15 NOTE — Telephone Encounter (Signed)
Patient called to update [place] that he was recently in the hospital for Sepsis/MERSA, PICC line placed discharge to home health for IV antibiotic course of Daptomycin for 26 days.  Course should be completed on 11/06/21. Patient is scheduled for a cysto/TRUS in our office on 10/21/21. Patient would like to know if it is ok for him to proceed with the procedure or if he should defer at this time? Please advise

## 2021-10-16 ENCOUNTER — Ambulatory Visit: Payer: BC Managed Care – PPO | Admitting: Urology

## 2021-10-16 NOTE — Telephone Encounter (Signed)
Left patient a VM asked to return call

## 2021-10-16 NOTE — Telephone Encounter (Signed)
Patient returned call. I informed him that it is best to have Cysto while on ABX. Patient voiced understanding and will be here Tuesday.

## 2021-10-17 ENCOUNTER — Other Ambulatory Visit: Payer: Self-pay | Admitting: Physician Assistant

## 2021-10-17 DIAGNOSIS — R338 Other retention of urine: Secondary | ICD-10-CM

## 2021-10-20 NOTE — Progress Notes (Signed)
10/21/2021  Chief Complaint  Patient presents with   Trus    HPI: Travis [name]. is a 70 y.o. male with a personal history of acute urinary retention, acute kidney insufficieny, gross hematuria, and overflow incontinence, who presents today for a cystoscopy.   He was seen in the ED with lower abdominal pain and urinary leakage and bladder was found to be distended. A foley catheter was placed and 17000 cc of urine was drained. His urine became bloody with foley catheter placement it was exchanged for 20 three-way hematuria catheter. He ended up having CBI, 3 L which cleared his urine and they sent him home.  He was recently in the ED for sepsis/MERSA, a PICC line was placed and he was discharged home for IV antibiotic course of Daptomycin.  He was accompanied today by a male family member.   NED. A&Ox3.   No respiratory distress   Abd soft, NT, ND Normal phallus with bilateral descended testicles    Cystoscopy Procedure Note  Patient identification was confirmed, informed consent was obtained, and patient was prepped using Betadine solution.  Lidocaine jelly was administered per urethral meatus.    Preoperative abx where received prior to procedure.     Pre-Procedure: - Inspection reveals a normal caliber ureteral meatus.  Procedure: The flexible cystoscope was introduced without difficulty - No urethral strictures/lesions are present. - Normal prostate  - Minimally Elevated bladder neck - Bilateral ureteral orifices identified - Bladder mucosa  reveals no ulcers, tumors, or lesions - No bladder stones - Mild trabeculation - Catheter cystitis  - trilobar coaptation minimally elevated bladder neck  Retroflexion shows slight intravesical protrusion with mild trabeculated bladder.    Post-Procedure: - Patient tolerated the procedure well   Prostate transrectal ultrasound sizing   Informed consent was obtained after discussing risks/benefits of the procedure.  A  time out was performed to ensure correct patient identity.   Pre-Procedure: -Transrectal probe was placed without difficulty -Transrectal Ultrasound performed revealing a 105.2 gm prostate measuring 5.02 x 6 x 6.07 cm (length) -No significant hypoechoic or median lobe noted      Assessment/ Plan:  BPH with urinary retention  - given gland size would recommend HoLEP -  discussed the role of urodynamics he ultimately declined.  - We discussed alternatives including TURP vs. holmium laser enucleation of the prostate vs. greenlight laser ablation. Differences between the surgical procedures were discussed as well as the risks and benefits of each.  He is most interested in HoLEP.  - We discussed the common postoperative course following holep including need for overnight Foley catheter, temporary worsening of irritative voiding symptoms, and occasional stress incontinence which typically lasts up to 6 months but can persist.  We discussed retrograde ejaculation and damage to surrounding structures including the urinary sphincter. Other uncommon complications including hematuria and urinary tract infection.  -He understands all of the above and is willing to proceed as planned.He would like to wait a month or two before pursuing due to acute medical illness his foley was replaced he can remove and resume CIC when he feels prepared  Follow-up for HoLEP   Conley Rolls as a scribe for Hollice Espy, MD.,have documented all relevant documentation on the behalf of Hollice Espy, MD,as directed by  Hollice Espy, MD while in the presence of Hollice Espy, MD.  I have reviewed the above documentation for accuracy and completeness, and I agree with the above.   Hollice Espy, MD

## 2021-10-21 ENCOUNTER — Other Ambulatory Visit: Payer: Self-pay

## 2021-10-21 ENCOUNTER — Other Ambulatory Visit (HOSPITAL_BASED_OUTPATIENT_CLINIC_OR_DEPARTMENT_OTHER): Payer: Self-pay | Admitting: Internal Medicine

## 2021-10-21 ENCOUNTER — Other Ambulatory Visit: Payer: Self-pay | Admitting: Internal Medicine

## 2021-10-21 ENCOUNTER — Ambulatory Visit (INDEPENDENT_AMBULATORY_CARE_PROVIDER_SITE_OTHER): Payer: BC Managed Care – PPO | Admitting: Urology

## 2021-10-21 DIAGNOSIS — R338 Other retention of urine: Secondary | ICD-10-CM | POA: Diagnosis not present

## 2021-10-21 DIAGNOSIS — R918 Other nonspecific abnormal finding of lung field: Secondary | ICD-10-CM

## 2021-10-21 NOTE — Patient Instructions (Signed)

## 2021-10-21 NOTE — Progress Notes (Signed)
Simple Catheter Placement  Due to urinary retention patient is present today for a foley cath placement.  Patient was cleaned and prepped in a sterile fashion with betadine. A 16 FR foley catheter was inserted, urine return was noted  378ml, urine was yellow in color.  The balloon was filled with 10cc of sterile water.  A leg bag was attached for drainage. Patient was also given a night bag to take home and was given instruction on how to change from one bag to another.  Patient was given instruction on proper catheter care.  Patient tolerated well, no complications were noted   Performed by: Verlene Mayer, CMA

## 2021-10-22 ENCOUNTER — Other Ambulatory Visit: Payer: Self-pay | Admitting: Urology

## 2021-10-22 DIAGNOSIS — N138 Other obstructive and reflux uropathy: Secondary | ICD-10-CM

## 2021-10-22 DIAGNOSIS — N401 Enlarged prostate with lower urinary tract symptoms: Secondary | ICD-10-CM

## 2021-10-22 NOTE — Progress Notes (Signed)
Surgical Physician Order Form  * Scheduling expectation : Next Available  *Length of Case:   *Clearance needed: no  *Anticoagulation Instructions: Hold all anticoagulants  *Aspirin Instructions: Hold Aspirin  *Post-op visit Date/Instructions:  1 week voiding trial  *Diagnosis: BPH w/urinary obstruction  *Procedure: HOLEP (41991)  -Admit type: OUTpatient  -Anesthesia: General  -VTE Prophylaxis Standing Order SCD's       Other:   -Standing Lab Orders Per Anesthesia    Lab other: UA&Urine Culture  -Standing Test orders EKG/Chest x-ray per Anesthesia       Test other:   - Medications:     Ancef 2gm IV   Other Instructions: Patient has a Foley.  He may end up choosing to remove this and going back to the CIC prior to surgery.  He would prefer to wait about a month or so before scheduling this procedure due to acute medical illness and recent hospitalization.

## 2021-10-23 DIAGNOSIS — Q2112 Patent foramen ovale: Secondary | ICD-10-CM | POA: Insufficient documentation

## 2021-10-23 DIAGNOSIS — I77810 Thoracic aortic ectasia: Secondary | ICD-10-CM | POA: Insufficient documentation

## 2021-10-30 ENCOUNTER — Other Ambulatory Visit: Payer: Self-pay

## 2021-10-30 ENCOUNTER — Inpatient Hospital Stay: Payer: BC Managed Care – PPO | Attending: Oncology

## 2021-10-30 DIAGNOSIS — C9110 Chronic lymphocytic leukemia of B-cell type not having achieved remission: Secondary | ICD-10-CM | POA: Insufficient documentation

## 2021-10-30 DIAGNOSIS — Z7901 Long term (current) use of anticoagulants: Secondary | ICD-10-CM | POA: Insufficient documentation

## 2021-10-30 DIAGNOSIS — Z79899 Other long term (current) drug therapy: Secondary | ICD-10-CM | POA: Insufficient documentation

## 2021-10-30 DIAGNOSIS — N401 Enlarged prostate with lower urinary tract symptoms: Secondary | ICD-10-CM | POA: Diagnosis not present

## 2021-10-30 DIAGNOSIS — G629 Polyneuropathy, unspecified: Secondary | ICD-10-CM | POA: Diagnosis not present

## 2021-10-30 DIAGNOSIS — R918 Other nonspecific abnormal finding of lung field: Secondary | ICD-10-CM | POA: Diagnosis present

## 2021-10-30 DIAGNOSIS — Z794 Long term (current) use of insulin: Secondary | ICD-10-CM | POA: Diagnosis not present

## 2021-10-30 DIAGNOSIS — E119 Type 2 diabetes mellitus without complications: Secondary | ICD-10-CM | POA: Insufficient documentation

## 2021-10-30 LAB — COMPREHENSIVE METABOLIC PANEL
ALT: 13 U/L (ref 0–44)
AST: 12 U/L — ABNORMAL LOW (ref 15–41)
Albumin: 3.2 g/dL — ABNORMAL LOW (ref 3.5–5.0)
Alkaline Phosphatase: 71 U/L (ref 38–126)
Anion gap: 7 (ref 5–15)
BUN: 21 mg/dL (ref 8–23)
CO2: 29 mmol/L (ref 22–32)
Calcium: 8.6 mg/dL — ABNORMAL LOW (ref 8.9–10.3)
Chloride: 102 mmol/L (ref 98–111)
Creatinine, Ser: 1 mg/dL (ref 0.61–1.24)
GFR, Estimated: >60 mL/min (ref >60)
Glucose, Bld: 192 mg/dL — ABNORMAL HIGH (ref 70–99)
Potassium: 4.7 mmol/L (ref 3.5–5.1)
Sodium: 138 mmol/L (ref 135–145)
Total Bilirubin: 0.7 mg/dL (ref 0.3–1.2)
Total Protein: 6 g/dL — ABNORMAL LOW (ref 6.5–8.1)

## 2021-10-30 LAB — CBC
HCT: 38.8 % — ABNORMAL LOW (ref 39.0–52.0)
Hemoglobin: 12.5 g/dL — ABNORMAL LOW (ref 13.0–17.0)
MCH: 27.8 pg (ref 26.0–34.0)
MCHC: 32.2 g/dL (ref 30.0–36.0)
MCV: 86.2 fL (ref 80.0–100.0)
Platelets: 224 10*3/uL (ref 150–400)
RBC: 4.5 MIL/uL (ref 4.22–5.81)
RDW: 14.3 % (ref 11.5–15.5)
WBC: 6.4 10*3/uL (ref 4.0–10.5)
nRBC: 0 % (ref 0.0–0.2)

## 2021-10-30 NOTE — Progress Notes (Signed)
[place]  Telephone:([phone] [phone]  ID: Travis [name]. OB: [birth year]  MR#: [id number]  WLN#:[id number]  Patient Care Team: Adin Hector, MD as PCP - General (Internal Medicine) Lucilla Lame, MD as Consulting Physician (Gastroenterology) Anabel Bene, MD as Referring Physician (Neurology)  CHIEF COMPLAINT: CLL.  INTERVAL HISTORY: Patient returns to clinic today for repeat laboratory work and routine 14-monthevaluation.  He was recently admitted to the hospital with MRSA bacteremia.  He also has a urinary catheter in place secondary to outlet obstruction from BPH.  He continues to have weakness and fatigue, but is improving since his hospital admission.  He is tolerating Imbruvica without significant side effects.  He denies any fevers.  He has a good appetite and denies weight loss.  He denies any chest pain, shortness of breath, cough, or hemoptysis.  He denies any nausea, vomiting, constipation, or diarrhea. He has no urinary complaints.  Patient offers no further specific complaints today.  REVIEW OF SYSTEMS:   Review of Systems  Constitutional:  Positive for malaise/fatigue. Negative for diaphoresis, fever and weight loss.  Respiratory: Negative.  Negative for cough and shortness of breath.   Cardiovascular: Negative.  Negative for chest pain and leg swelling.  Gastrointestinal:  Negative for abdominal pain, blood in stool, melena, nausea and vomiting.  Genitourinary: Negative.  Negative for dysuria.  Musculoskeletal: Negative.  Negative for back pain.  Skin: Negative.  Negative for rash.  Neurological:  Positive for focal weakness and weakness. Negative for dizziness, tingling, sensory change and headaches.  Psychiatric/Behavioral: Negative.  Negative for depression. The patient is not nervous/anxious and does not have insomnia.    As per HPI. Otherwise, a complete review of systems is negative.  PAST MEDICAL HISTORY: Past  Medical History:  Diagnosis Date   Diabetes mellitus without complication ([place]    Leukemia ([place]     PAST SURGICAL HISTORY: Past Surgical History:  Procedure Laterality Date   COLONOSCOPY     ESOPHAGOGASTRODUODENOSCOPY (EGD) WITH PROPOFOL N/A 03/14/2018   Procedure: ESOPHAGOGASTRODUODENOSCOPY (EGD) WITH PROPOFOL;  Surgeon: AJonathon Bellows MD;  [facility];  Service: Gastroenterology;  Laterality: N/A;   ESOPHAGOGASTRODUODENOSCOPY (EGD) WITH PROPOFOL N/A 06/13/2018   Procedure: ESOPHAGOGASTRODUODENOSCOPY (EGD) WITH PROPOFOL;  Surgeon: AJonathon Bellows MD;  [facility];  Service: Gastroenterology;  Laterality: N/A;   ESOPHAGOGASTRODUODENOSCOPY (EGD) WITH PROPOFOL N/A 10/09/2021   Procedure: ESOPHAGOGASTRODUODENOSCOPY (EGD) WITH PROPOFOL;  Surgeon: TVirgel Manifold MD;  Location: [facility] ENDOSCOPY;  Service: Endoscopy;  Laterality: N/A;   TEE WITHOUT CARDIOVERSION N/A 10/09/2021   Procedure: TRANSESOPHAGEAL ECHOCARDIOGRAM (TEE);  Surgeon: TVirgel Manifold MD;  [facility];  Service: Endoscopy;  Laterality: N/A;   WISDOM TOOTH EXTRACTION      FAMILY HISTORY: Family History  Problem Relation Age of Onset   Leukemia Mother    Colon cancer Father    Diabetes Father    Heart disease Father    Diabetes Brother     ADVANCED DIRECTIVES (Y/N):  N  HEALTH MAINTENANCE: Social History   Tobacco Use   Smoking status: Never   Smokeless tobacco: Never  Vaping Use   Vaping Use: Never used  Substance Use Topics   Alcohol use: Yes    Comment: rarely   Drug use: No     Colonoscopy:  PAP:  Bone density:  Lipid panel:  No Known Allergies  Current Outpatient Medications  Medication Sig Dispense Refill   acetaminophen (TYLENOL) 500 MG tablet Take 500-1,000 mg  by mouth every 6 (six) hours as needed for mild pain.     Blood Glucose Monitoring Suppl ([place]) w/Device KIT See admin instructions.     Cholecalciferol (VITAMIN D3)  1000 units CAPS Take 1,000 Int'l Units by mouth daily.     citalopram (CELEXA) 20 MG tablet Take 40 mg by mouth daily.     daptomycin (CUBICIN) IVPB Inject 650 mg into the vein daily for 26 days. Indication:  MRSA bacteremia First Dose: Yes Last Day of Therapy:  11/06/2021 Labs - Once weekly:  CBC/D, SCR, CRP and CPK FAX weekly labs to [phone] Method of administration: IV Push Method of administration may be changed at the discretion of home infusion pharmacist based upon assessment of the patient and/or caregiver's ability to self-administer the medication ordered. 17 Units 0   ibrutinib (IMBRUVICA) 420 MG TABS TAKE 1 TABLET BY MOUTH DAILY. 28 tablet 5   insulin glargine (LANTUS) 100 UNIT/ML Solostar Pen Inject 15 Units into the skin 2 (two) times daily. 15 mL 1   Insulin Pen Needle 31G X 5 MM MISC Use as directed 60 each 2   Lancets (FREESTYLE) lancets Use as instructed 100 each 2   metFORMIN (GLUCOPHAGE) 1000 MG tablet Take by mouth.     Multiple Vitamin (MULTIVITAMIN WITH MINERALS) TABS tablet Take 1 tablet by mouth daily.     ONETOUCH VERIO test strip 2 (two) times daily. use for testing     pantoprazole (PROTONIX) 40 MG tablet Take 1 tablet (40 mg total) by mouth 2 (two) times daily. 60 tablet 2   No current facility-administered medications for this visit.    OBJECTIVE: Vitals:   11/06/21 0942  BP: 133/79  Pulse: 66  Resp: 18  Temp: (!) 97.2 F (36.2 C)  SpO2: 100%     Body mass index is 25.86 kg/m.    ECOG FS:1 - Symptomatic but completely ambulatory  General: Well-developed, well-nourished, no acute distress. Eyes: Pink conjunctiva, anicteric sclera. HEENT: Normocephalic, moist mucous membranes. Lungs: No audible wheezing or coughing. Heart: Regular rate and rhythm. Abdomen: Soft, nontender, no obvious distention. Musculoskeletal: No edema, cyanosis, or clubbing. Neuro: Alert, answering all questions appropriately. Cranial nerves grossly intact. Skin: No rashes  or petechiae noted. Psych: Normal affect.   LAB RESULTS:  Lab Results  Component Value Date   NA 138 10/30/2021   K 4.7 10/30/2021   CL 102 10/30/2021   CO2 29 10/30/2021   GLUCOSE 192 (H) 10/30/2021   BUN 21 10/30/2021   CREATININE 1.00 10/30/2021   CALCIUM 8.6 (L) 10/30/2021   PROT 6.0 (L) 10/30/2021   ALBUMIN 3.2 (L) 10/30/2021   AST 12 (L) 10/30/2021   ALT 13 10/30/2021   ALKPHOS 71 10/30/2021   BILITOT 0.7 10/30/2021   GFRNONAA >60 10/30/2021   GFRAA >60 07/05/2020    Lab Results  Component Value Date   WBC 6.4 10/30/2021   NEUTROABS 27.5 (H) 10/05/2021   HGB 12.5 (L) 10/30/2021   HCT 38.8 (L) 10/30/2021   MCV 86.2 10/30/2021   PLT 224 10/30/2021     STUDIES: ECHO TEE  Result Date: 10/09/2021    TRANSESOPHOGEAL ECHO REPORT   Patient Name:   Travis [name]. Date of Exam: 10/09/2021 Medical Rec #:  [id number]           Height:       71.0 in Accession #:    [id number]          Weight:  181.0 lb Date of Birth:  [birth year]           BSA:          2.021 m Patient Age:    15 years            BP:           82/72 mmHg Patient Gender: M                   HR:           100 bpm. Exam Location:  [facility] Procedure: Transesophageal Echo, Color Doppler, Cardiac Doppler and Saline            Contrast Bubble Study Indications:     R78.81 Bacteremia  History:         Patient has prior history of Echocardiogram examinations, most                  recent 10/06/2021. Risk Factors:Diabetes. Leukemia.  Sonographer:     Charmayne Sheer Referring Phys:  [id number] Robin Searing VISSER Diagnosing Phys: Ida Rogue MD PROCEDURE: The transesophogeal probe was passed without difficulty through the esophogus of the patient. Sedation performed by different physician. Image quality was excellent. The patient's vital signs; including heart rate, blood pressure, and oxygen saturation; remained stable throughout the procedure. The patient developed no complications during the procedure. IMPRESSIONS  1. No  valve vegetation noted/no endocarditis  2. Left ventricular ejection fraction, by estimation, is 60 to 65%. The left ventricle has normal function. The left ventricle has no regional wall motion abnormalities.  3. Right ventricular systolic function is normal. The right ventricular size is normal.  4. The mitral valve is normal in structure. Mild mitral valve regurgitation. No evidence of mitral stenosis.  5. The aortic valve is normal in structure. Aortic valve regurgitation is trivial. No aortic stenosis is present.  6. No left atrial/left atrial appendage thrombus was detected.  7. There is mild dilatation of the ascending aorta, measuring 42 mm. There is mild dilatation of the aortic root, measuring 42 mm.  8. Evidence of atrial level shunting from PFO detected by color flow Doppler. Agitated saline contrast bubble study was positive with shunting observed within 3-6 cardiac cycles suggestive of interatrial shunt. There is a small patent foramen ovale. Conclusion(s)/Recommendation(s): Normal biventricular function without evidence of hemodynamically significant valvular heart disease. FINDINGS  Left Ventricle: Left ventricular ejection fraction, by estimation, is 60 to 65%. The left ventricle has normal function. The left ventricle has no regional wall motion abnormalities. The left ventricular internal cavity size was normal in size. There is  no left ventricular hypertrophy. Right Ventricle: The right ventricular size is normal. No increase in right ventricular wall thickness. Right ventricular systolic function is normal. Left Atrium: Left atrial size was normal in size. No left atrial/left atrial appendage thrombus was detected. Right Atrium: Right atrial size was normal in size. Pericardium: There is no evidence of pericardial effusion. Mitral Valve: The mitral valve is normal in structure. Mild mitral valve regurgitation. No evidence of mitral valve stenosis. Tricuspid Valve: The tricuspid valve is normal  in structure. Tricuspid valve regurgitation is trivial. No evidence of tricuspid stenosis. Aortic Valve: The aortic valve is normal in structure. Aortic valve regurgitation is trivial. No aortic stenosis is present. Pulmonic Valve: The pulmonic valve was normal in structure. Pulmonic valve regurgitation is not visualized. No evidence of pulmonic stenosis. Aorta: The aortic root is normal in size and structure. There is mild  dilatation of the ascending aorta, measuring 42 mm. There is mild dilatation of the aortic root, measuring 42 mm. Venous: The inferior vena cava was not well visualized. The inferior vena cava is normal in size with greater than 50% respiratory variability, suggesting right atrial pressure of 3 mmHg. IAS/Shunts: Evidence of atrial level shunting detected by color flow Doppler. Agitated saline contrast was given intravenously to evaluate for intracardiac shunting. Agitated saline contrast bubble study was positive with shunting observed within 3-6 cardiac cycles suggestive of interatrial shunt. A small patent foramen ovale is detected.   AORTA Ao Root diam: 4.20 cm Ida Rogue MD Electronically signed by Ida Rogue MD Signature Date/Time: 10/09/2021/6:43:51 PM    Final    [place] EKG SITE RITE  Result Date: 10/10/2021 If Site Rite image not attached, placement could not be confirmed due to current cardiac rhythm.   ASSESSMENT: CLL.  PLAN:    1. CLL: No evidence of disease.  Patient's white blood cell count continues to be within normal limits.  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 PET scan also revealed diffuse bilateral cervical, axillary, retroperitoneal, iliac, and inguinal lymphadenopathy with a low level of metabolic uptake consistent with CLL. 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 Patient initiated Dolores Lory in approximately November 2017 with significant improvement of his B  symptoms including his unusual neuropathy symptoms.  Patient has now been on Imbruvica for approximately 5 years and has agreed to discontinue treatment.   No further imaging is necessary unless there is suspicion of progression of disease.  Return to clinic in 3 months for repeat laboratory work only and then in 6 months for laboratory work and further evaluation.  If patient had any evidence of recurrence, would reinitiate Imbruvica at which point he would require treatment lifelong. 2. Neuropathy, foot drop: Chronic and unchanged.  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  Patient was previously given a referral to Occupational Therapy to help improve balance. 3. Difficulty swallowing: Patient does not complain of this today.  Appreciate GI input.  Patient had esophageal dilation on June 13, 2018. 4.  Hyperglycemia: Patient has improved blood glucose control.  Historically, patient's blood glucose is in 300 range.  Continue follow-up and treatment per primary care  5.  Thrombocytopenia: Patient's platelet count has been within the normal range since November 2021. 6.  MRSA bacteremia: Resolved. 7.  Bladder outlet obstruction secondary to BPH: Continue follow-up with urology as scheduled.   Patient expressed understanding and was in agreement with this plan. He also understands that He can call clinic at any time with any questions, concerns, or complaints.    Lloyd Huger, MD   11/06/2021 10:47 AM

## 2021-11-03 ENCOUNTER — Other Ambulatory Visit (HOSPITAL_COMMUNITY): Payer: Self-pay

## 2021-11-04 ENCOUNTER — Other Ambulatory Visit (HOSPITAL_COMMUNITY): Payer: Self-pay

## 2021-11-06 ENCOUNTER — Encounter: Payer: Self-pay | Admitting: Oncology

## 2021-11-06 ENCOUNTER — Other Ambulatory Visit: Payer: Self-pay

## 2021-11-06 ENCOUNTER — Other Ambulatory Visit: Payer: Self-pay | Admitting: Physician Assistant

## 2021-11-06 ENCOUNTER — Inpatient Hospital Stay (HOSPITAL_BASED_OUTPATIENT_CLINIC_OR_DEPARTMENT_OTHER): Payer: BC Managed Care – PPO | Admitting: Oncology

## 2021-11-06 VITALS — BP 133/79 | HR 66 | Temp 97.2°F | Resp 18 | Wt 185.4 lb

## 2021-11-06 DIAGNOSIS — C9110 Chronic lymphocytic leukemia of B-cell type not having achieved remission: Secondary | ICD-10-CM

## 2021-11-06 DIAGNOSIS — R918 Other nonspecific abnormal finding of lung field: Secondary | ICD-10-CM | POA: Diagnosis not present

## 2021-11-06 DIAGNOSIS — R338 Other retention of urine: Secondary | ICD-10-CM

## 2021-11-06 NOTE — Progress Notes (Signed)
Pt states that he has noticed his shakes becoming more frequent. As well as, having difficulty staying asleep throughout the night at times, every night he wakes up at 3am with or without melatonin.

## 2021-11-12 ENCOUNTER — Ambulatory Visit: Payer: BC Managed Care – PPO | Admitting: Gastroenterology

## 2021-11-13 ENCOUNTER — Ambulatory Visit: Payer: BC Managed Care – PPO | Admitting: Urology

## 2021-11-14 ENCOUNTER — Other Ambulatory Visit: Payer: Self-pay

## 2021-11-14 ENCOUNTER — Telehealth: Payer: Self-pay | Admitting: *Deleted

## 2021-11-14 ENCOUNTER — Ambulatory Visit
Admission: RE | Admit: 2021-11-14 | Discharge: 2021-11-14 | Disposition: A | Discharge status: Home / Self Care | Payer: BC Managed Care – PPO | Source: Ambulatory Visit | Attending: Internal Medicine | Admitting: Internal Medicine

## 2021-11-14 DIAGNOSIS — R918 Other nonspecific abnormal finding of lung field: Secondary | ICD-10-CM | POA: Insufficient documentation

## 2021-11-14 IMAGING — CT CT CHEST W/ CM
2 of 4 series · 15 of 36 positions shown, 18 images · IV contrast (omnipaque)
Comparison: [DATE] [DATE], [DATE].  [DATE] [DATE], [DATE].

CLINICAL DATA: Lung nodules.

EXAM:
CT CHEST WITH CONTRAST
TECHNIQUE: Multidetector CT imaging of the chest was performed during
intravenous contrast administration.
CONTRAST:  75mL OMNIPAQUE IOHEXOL 300 MG/ML  SOLN

[Series 2: axial chest 2.00 · axial · 0.70mm/px · z∈[-1217,-931]mm · 12 of 169 slices shown, 15 images]
[im 13/169  mediastinal]
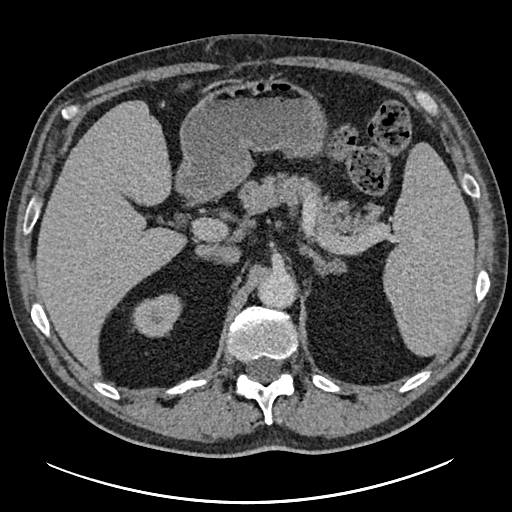
[im 13/169  lung]
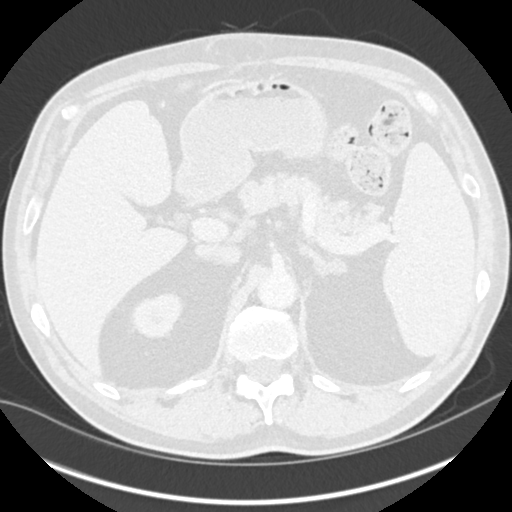
[im 26/169  lung]
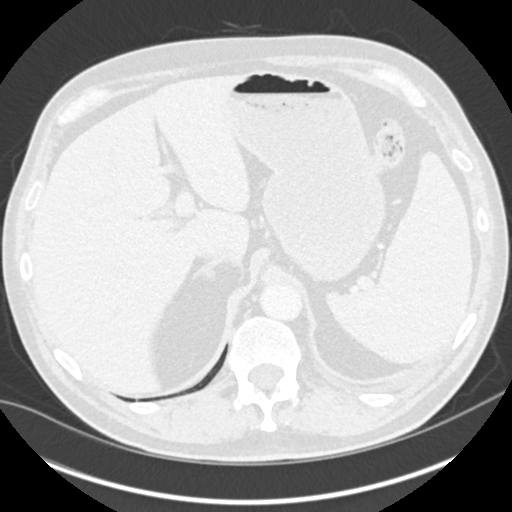
[im 39/169  lung]
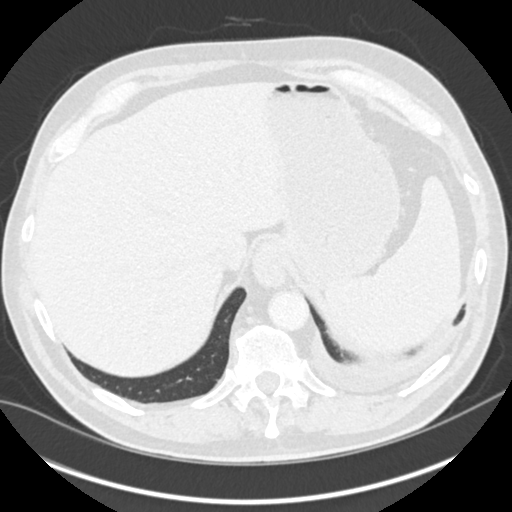
[im 52/169  lung]
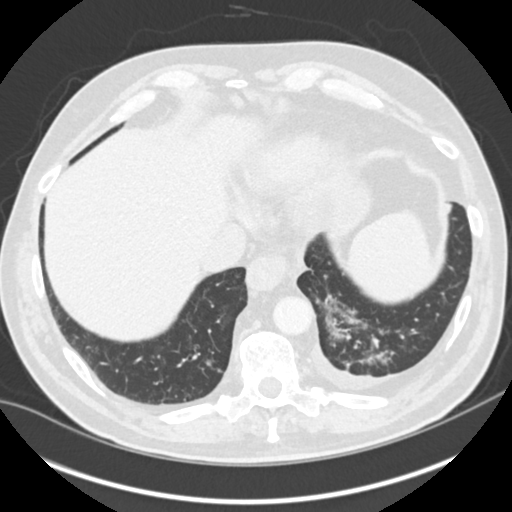
[im 65/169  mediastinal]
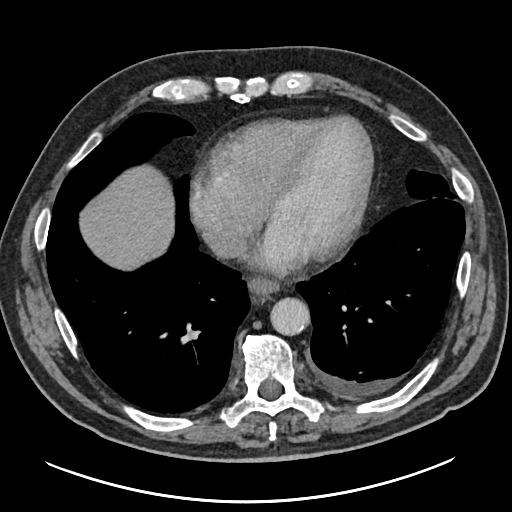
[im 65/169  lung]
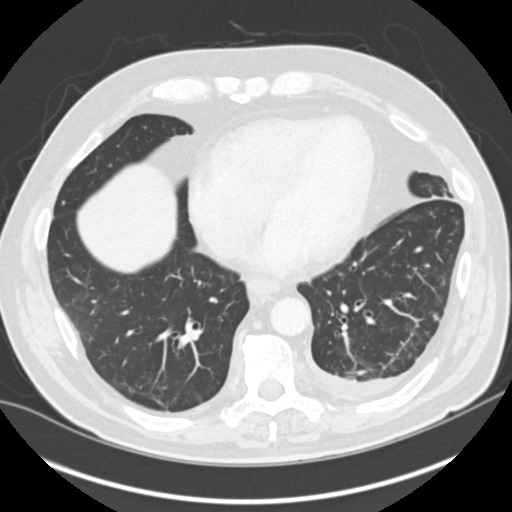
[im 78/169  lung]
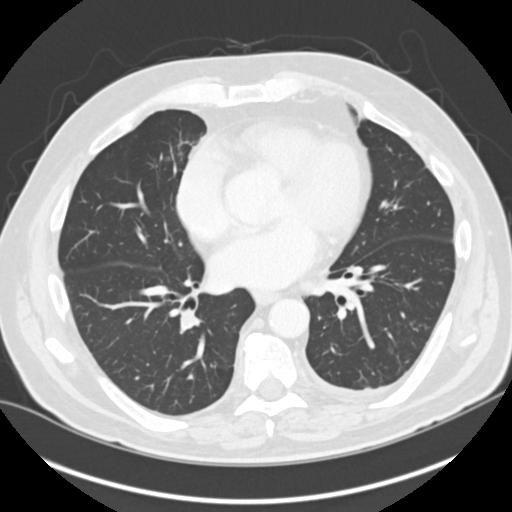
[im 91/169  lung]
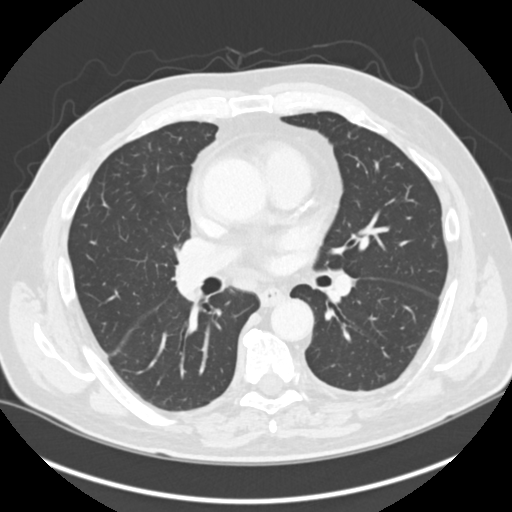
[im 104/169  lung]
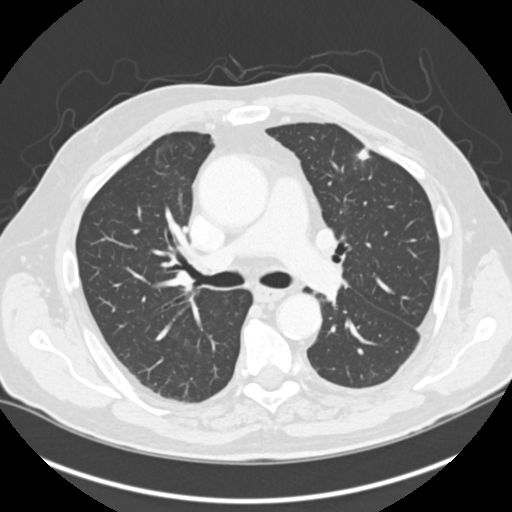
[im 117/169  mediastinal]
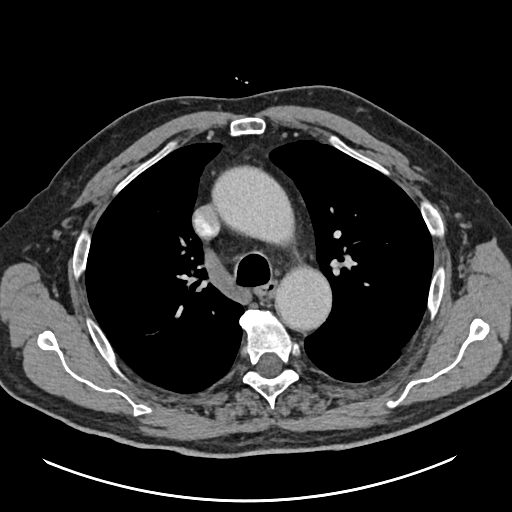
[im 117/169  lung]
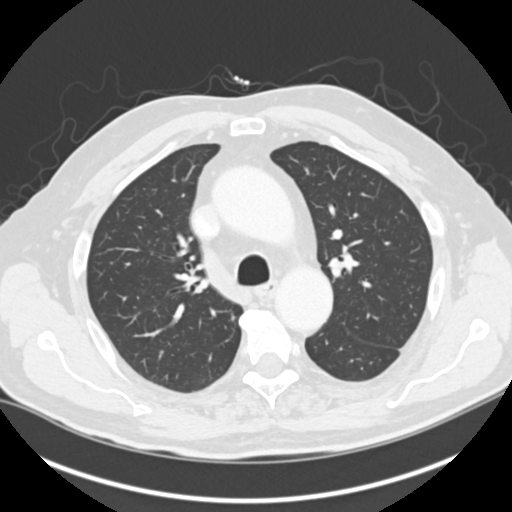
[im 130/169  lung]
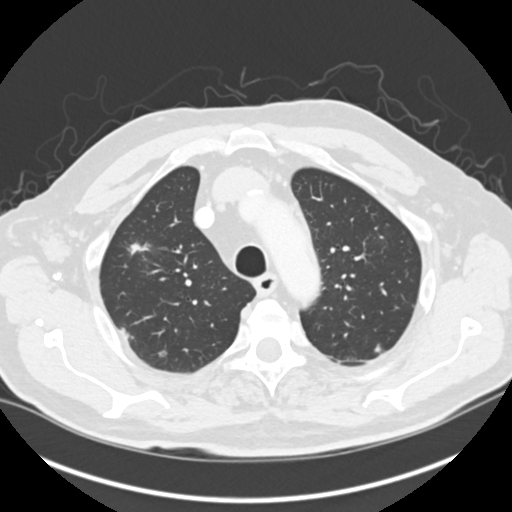
[im 143/169  lung]
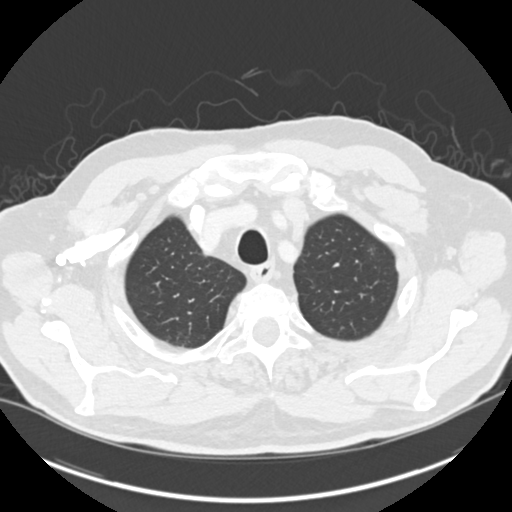
[im 156/169  lung]
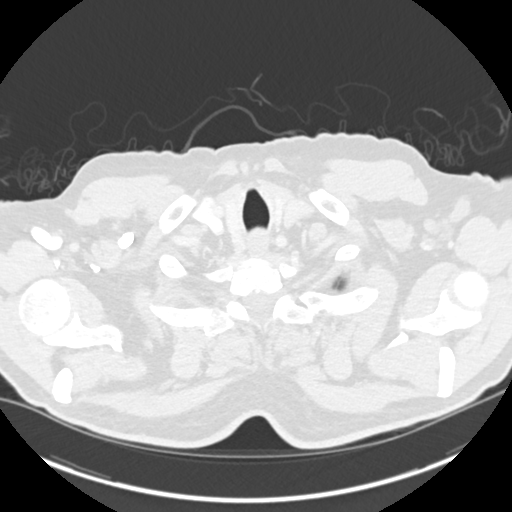

[Series 4: coronal chest 2.00 cor · coronal · 0.66mm/px · 3 of 147 slices shown]
[im 30/147  lung]
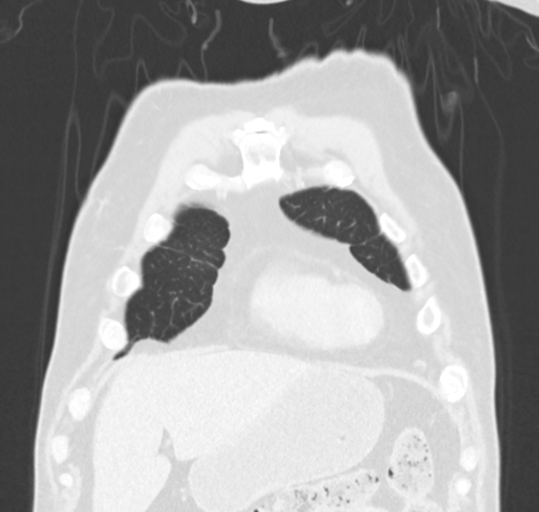
[im 59/147  lung]
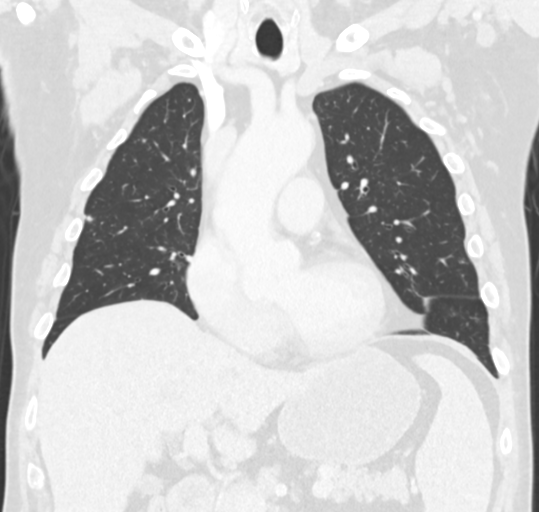
[im 88/147  lung]
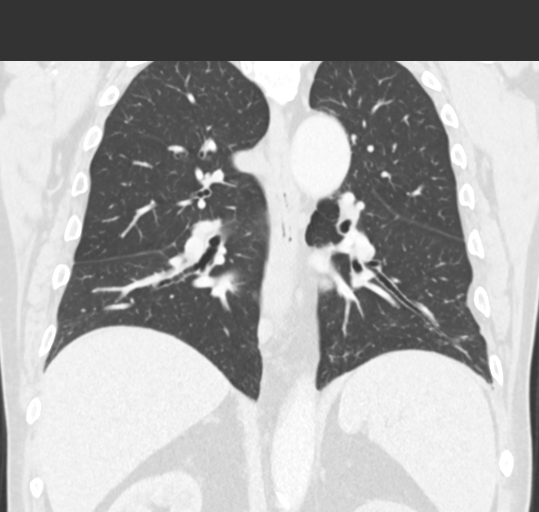

[15 of 36 positions shown; findings below may reference images not displayed]

FINDINGS: Cardiovascular: Normal cardiac size. No pericardial effusion. 4.9 cm
ascending thoracic aortic aneurysm is noted. Atherosclerosis of
thoracic aorta is noted. Mild coronary artery calcifications are
noted. Note is made of pulmonary embolus involving lower lobe branch
of right pulmonary artery.

Mediastinum/Nodes: Thyroid gland is unremarkable. Small sliding-type
hiatal hernia is noted. 3.0 x 1.6 cm right axillary lymph node is
noted. 8 mm subcarinal lymph node is noted.

Lungs/Pleura: Small left pleural effusion is noted. No pneumothorax
is noted. 2.2 x 1.4 cm spiculated mass with pleural tail is noted in
right lower lobe laterally best seen on image number 83 of series 3.
This is highly concerning for malignancy. 11 x 10 mm cavitating
spiculated nodular density is noted in right upper lobe best seen on
image number 37 of series 3, concerning for possible metastatic
disease. 6 mm nodule is noted in left upper lobe best seen on image
number 37 of series 3. 10 mm subpleural nodule is noted in right
upper lobe best seen on image number 43 of series 3. Left lower lobe
airspace opacity is noted concerning for possible pneumonia.

Upper Abdomen: No acute abnormality.

Musculoskeletal: No chest wall abnormality. No acute or significant
osseous findings.
IMPRESSION: Acute pulmonary embolus is noted in lower lobe branch of right
pulmonary artery. Critical Value/emergent results were called by
telephone at the time of interpretation on [DATE] at [DATE] to
provider OLISHARMA , who verbally acknowledged these results.

[DATE] x 1.4 cm spiculated mass with pleural tail is noted in right
lower lobe concerning for malignancy. Also noted is 11 mm cavitating
spiculated nodule in right upper lobe concerning for metastatic
disease. Multiple other nodules are noted bilaterally concerning for
metastatic disease. PET scan is recommended for further evaluation.

3 cm right axillary lymph node is noted concerning for possible
metastatic disease. 8 mm subcarinal lymph node is noted which may
represent metastatic disease.

4.9 cm ascending thoracic aortic aneurysm. Ascending thoracic aortic
aneurysm. Recommend semi-annual imaging followup by CTA or MRA and
referral to cardiothoracic surgery if not already obtained. This
recommendation follows [7X]
ACCF/AHA/AATS/ACR/ASA/SCA/OLISHARMA/OLISHARMA/OLISHARMA/OLISHARMA Guidelines for the
Diagnosis and Management of Patients With Thoracic Aortic Disease.
Circulation. [7X]; 121: [id number]. Aortic aneurysm NOS
([7X]-[7X]).

Small sliding-type hiatal hernia.

Mild coronary artery calcifications are noted.

Aortic Atherosclerosis ([7X]-[7X]).

## 2021-11-14 MED ORDER — IOHEXOL 300 MG/ML  SOLN
75.0000 mL | Freq: Once | INTRAMUSCULAR | Status: AC | PRN
  Administered 2021-11-14: 75 mL via INTRAVENOUS

## 2021-11-14 NOTE — Telephone Encounter (Signed)
Patient received  CT results today from Dr. Caryl Comes who advised him to contact Dr. Grayland Ormond immediately to review results. He wouldl ike a call back.

## 2021-11-14 NOTE — Telephone Encounter (Signed)
Pt scheduled 11/19/21 at 9am with Dr. Grayland Ormond regarding CT results.

## 2021-11-17 ENCOUNTER — Ambulatory Visit (INDEPENDENT_AMBULATORY_CARE_PROVIDER_SITE_OTHER): Payer: BC Managed Care – PPO | Admitting: Gastroenterology

## 2021-11-17 ENCOUNTER — Encounter: Payer: Self-pay | Admitting: Gastroenterology

## 2021-11-17 ENCOUNTER — Other Ambulatory Visit: Payer: Self-pay

## 2021-11-17 VITALS — BP 157/96 | HR 92 | Temp 97.7°F | Ht 71.0 in | Wt 190.6 lb

## 2021-11-17 DIAGNOSIS — R1319 Other dysphagia: Secondary | ICD-10-CM | POA: Diagnosis not present

## 2021-11-17 DIAGNOSIS — R918 Other nonspecific abnormal finding of lung field: Secondary | ICD-10-CM | POA: Insufficient documentation

## 2021-11-17 DIAGNOSIS — K3189 Other diseases of stomach and duodenum: Secondary | ICD-10-CM | POA: Diagnosis not present

## 2021-11-17 DIAGNOSIS — I2699 Other pulmonary embolism without acute cor pulmonale: Secondary | ICD-10-CM | POA: Insufficient documentation

## 2021-11-17 DIAGNOSIS — Z86711 Personal history of pulmonary embolism: Secondary | ICD-10-CM | POA: Insufficient documentation

## 2021-11-17 MED ORDER — PANTOPRAZOLE SODIUM 40 MG PO TBEC: DELAYED_RELEASE_TABLET | ORAL | 0 refills | Status: DC

## 2021-11-17 NOTE — Progress Notes (Signed)
[place]  [phone] [phone]  ID: Travis [name]. 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  INTERVAL HISTORY: Patient returns to clinic today as an add-on after recent CT scan revealed a right lower lobe lung mass.  He is anxious, but otherwise feels well.  He continues to have weakness and fatigue.  He has no new neurologic complaints.  He denies any fevers.  He has a good appetite and denies weight loss.  He denies any chest pain, shortness of breath, cough, or hemoptysis.  He denies any nausea, vomiting, constipation, or diarrhea. He has no urinary complaints.  Patient offers no further specific complaints today.  REVIEW OF SYSTEMS:   Review of Systems  Constitutional:  Positive for malaise/fatigue. Negative for diaphoresis, fever and weight loss.  Respiratory: Negative.  Negative for cough and shortness of breath.   Cardiovascular: Negative.  Negative for chest pain and leg swelling.  Gastrointestinal:  Negative for abdominal pain, blood in stool, melena, nausea and vomiting.  Genitourinary: Negative.  Negative for dysuria.  Musculoskeletal: Negative.  Negative for back pain.  Skin: Negative.  Negative for rash.  Neurological:  Positive for focal weakness and weakness. Negative for dizziness, tingling, sensory change and headaches.  Psychiatric/Behavioral:  Negative for depression. The patient is nervous/anxious. The patient does not have insomnia.    As per HPI. Otherwise, a complete review of systems is negative.  PAST MEDICAL HISTORY: Past Medical History:  Diagnosis Date   Diabetes mellitus without complication ([place])    Leukemia ([place])     PAST SURGICAL  HISTORY: Past Surgical History:  Procedure Laterality Date   COLONOSCOPY     ESOPHAGOGASTRODUODENOSCOPY (EGD) WITH PROPOFOL N/A 03/14/2018   Procedure: ESOPHAGOGASTRODUODENOSCOPY (EGD) WITH PROPOFOL;  Surgeon: [name], MD;  Location: [facility] ENDOSCOPY;  Service: Gastroenterology;  Laterality: N/A;   ESOPHAGOGASTRODUODENOSCOPY (EGD) WITH PROPOFOL N/A 06/13/2018   Procedure: ESOPHAGOGASTRODUODENOSCOPY (EGD) WITH PROPOFOL;  Surgeon: [name], MD;  [facility]COPY;  Service: Gastroenterology;  Laterality: N/A;   ESOPHAGOGASTRODUODENOSCOPY (EGD) WITH PROPOFOL N/A 10/09/2021   Procedure: ESOPHAGOGASTRODUODENOSCOPY (EGD) WITH PROPOFOL;  Surgeon: Virgel Manifold, MD;  Location: [facility] ENDOSCOPY;  Service: Endoscopy;  Laterality: N/A;   TEE WITHOUT CARDIOVERSION N/A 10/09/2021   Procedure: TRANSESOPHAGEAL ECHOCARDIOGRAM (TEE);  Surgeon: Virgel Manifold, MD;  Location: [facility] ENDOSCOPY;  Service: Endoscopy;  Laterality: N/A;   WISDOM TOOTH EXTRACTION      FAMILY HISTORY: Family History  Problem Relation Age of Onset   Leukemia Mother    Colon cancer Father    Diabetes Father    Heart disease Father    Diabetes Brother     ADVANCED DIRECTIVES (Y/N):  N  HEALTH MAINTENANCE: Social History   Tobacco Use   Smoking status: Never   Smokeless tobacco: Never  Vaping Use   Vaping Use: Never used  Substance Use Topics   Alcohol use: Yes    Comment: rarely   Drug use: No     Colonoscopy:  PAP:  Bone density:  Lipid panel:  No Known Allergies  Current Outpatient Medications  Medication Sig Dispense Refill   acetaminophen (TYLENOL) 500 MG tablet Take 500-1,000 mg by mouth every 6 (six) hours as needed for mild pain.  amoxicillin (AMOXIL) 500 MG capsule Take 500 mg by mouth 3 (three) times daily.     Blood Glucose Monitoring Suppl ([place]) w/Device KIT See admin instructions.     Cholecalciferol (VITAMIN D3) 1000 units CAPS Take 1,000 Int'l Units  by mouth daily.     citalopram (CELEXA) 20 MG tablet Take 40 mg by mouth daily.     ELIQUIS 5 MG TABS tablet Take by mouth.     ibrutinib (IMBRUVICA) 420 MG TABS TAKE 1 TABLET BY MOUTH DAILY. 28 tablet 5   insulin glargine (LANTUS) 100 UNIT/ML Solostar Pen Inject 15 Units into the skin 2 (two) times daily. 15 mL 1   Insulin Pen Needle 31G X 5 MM MISC Use as directed 60 each 2   Lancets (FREESTYLE) lancets Use as instructed 100 each 2   metFORMIN (GLUCOPHAGE) 1000 MG tablet Take by mouth.     Multiple Vitamin (MULTIVITAMIN WITH MINERALS) TABS tablet Take 1 tablet by mouth daily.     ONETOUCH VERIO test strip 2 (two) times daily. use for testing     pantoprazole (PROTONIX) 40 MG tablet Take 1 tablet 2 times daily for 30 days, then 1 tablet once daily thereafter 120 tablet 0   No current facility-administered medications for this visit.    OBJECTIVE: Vitals:   11/19/21 0856  BP: 127/78  Pulse: 72  Resp: 16  Temp: 97.6 F (36.4 C)  SpO2: 100%     Body mass index is 26.71 kg/m.    ECOG FS:1 - Symptomatic but completely ambulatory  General: Well-developed, well-nourished, no acute distress. Eyes: Pink conjunctiva, anicteric sclera. HEENT: Normocephalic, moist mucous membranes. Lungs: No audible wheezing or coughing. Heart: Regular rate and rhythm. Abdomen: Soft, nontender, no obvious distention. Musculoskeletal: No edema, cyanosis, or clubbing. Neuro: Alert, answering all questions appropriately. Cranial nerves grossly intact. Skin: No rashes or petechiae noted. Psych: Normal affect.   LAB RESULTS:  Lab Results  Component Value Date   NA 138 10/30/2021   K 4.7 10/30/2021   CL 102 10/30/2021   CO2 29 10/30/2021   GLUCOSE 192 (H) 10/30/2021   BUN 21 10/30/2021   CREATININE 1.00 10/30/2021   CALCIUM 8.6 (L) 10/30/2021   PROT 6.0 (L) 10/30/2021   ALBUMIN 3.2 (L) 10/30/2021   AST 12 (L) 10/30/2021   ALT 13 10/30/2021   ALKPHOS 71 10/30/2021   BILITOT 0.7 10/30/2021    GFRNONAA >60 10/30/2021   GFRAA >60 07/05/2020    Lab Results  Component Value Date   WBC 6.4 10/30/2021   NEUTROABS 27.5 (H) 10/05/2021   HGB 12.5 (L) 10/30/2021   HCT 38.8 (L) 10/30/2021   MCV 86.2 10/30/2021   PLT 224 10/30/2021     STUDIES: CT CHEST W CONTRAST  Result Date: 11/14/2021 CLINICAL DATA:  Lung nodules. EXAM: CT CHEST WITH CONTRAST TECHNIQUE: Multidetector CT imaging of the chest was performed during intravenous contrast administration. CONTRAST:  42m OMNIPAQUE IOHEXOL 300 MG/ML  SOLN COMPARISON:  October 06, 2021.  October 05, 2021. FINDINGS: Cardiovascular: Normal cardiac size. No pericardial effusion. 4.9 cm ascending thoracic aortic aneurysm is noted. Atherosclerosis of thoracic aorta is noted. Mild coronary artery calcifications are noted. Note is made of pulmonary embolus involving lower lobe branch of right pulmonary artery. Mediastinum/Nodes: Thyroid gland is unremarkable. Small sliding-type hiatal hernia is noted. 3.0 x 1.6 cm right axillary lymph node is noted. 8 mm subcarinal lymph node is noted. Lungs/Pleura: Small left pleural effusion is noted. No pneumothorax is  noted. 2.2 x 1.4 cm spiculated mass with pleural tail is noted in right lower lobe laterally best seen on image number 83 of series 3. This is highly concerning for malignancy. 11 x 10 mm cavitating spiculated nodular density is noted in right upper lobe best seen on image number 37 of series 3, concerning for possible metastatic disease. 6 mm nodule is noted in left upper lobe best seen on image number 37 of series 3. 10 mm subpleural nodule is noted in right upper lobe best seen on image number 43 of series 3. Left lower lobe airspace opacity is noted concerning for possible pneumonia. Upper Abdomen: No acute abnormality. Musculoskeletal: No chest wall abnormality. No acute or significant osseous findings. IMPRESSION: Acute pulmonary embolus is noted in lower lobe branch of right pulmonary artery. Critical  Value/emergent results were called by telephone at the time of interpretation on 11/14/2021 at 11:39 am to provider Ramonita Lab , who verbally acknowledged these results. 2.2 x 1.4 cm spiculated mass with pleural tail is noted in right lower lobe concerning for malignancy. Also noted is 11 mm cavitating spiculated nodule in right upper lobe concerning for metastatic disease. Multiple other nodules are noted bilaterally concerning for metastatic disease. PET scan is recommended for further evaluation. 3 cm right axillary lymph node is noted concerning for possible metastatic disease. 8 mm subcarinal lymph node is noted which may represent metastatic disease. 4.9 cm ascending thoracic aortic aneurysm. Ascending thoracic aortic aneurysm. Recommend semi-annual imaging followup by CTA or MRA and referral to cardiothoracic surgery if not already obtained. This recommendation follows 2010 ACCF/AHA/AATS/ACR/ASA/SCA/SCAI/SIR/STS/SVM Guidelines for the Diagnosis and Management of Patients With Thoracic Aortic Disease. Circulation. 2010; 121: W295-A213. Aortic aneurysm NOS (ICD10-I71.9). Small sliding-type hiatal hernia. Mild coronary artery calcifications are noted. Aortic Atherosclerosis (ICD10-I70.0). Electronically Signed   By: Marijo Conception M.D.   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  PLAN:    Right lower lobe lung mass: CT scan results from November 14, 2021 reviewed independently and reported as above with a 2.2 right lower lobe spiculated mass highly suspicious for underlying malignancy.  MRI of the brain on October 06, 2021 was negative for any significant pathology.  Patient will require a PET scan for staging purposes and a referral was also given to pulmonology for consideration of bronchoscopy/EBUS for diagnosis.  Patient will return to clinic in 1 to 2 weeks after his biopsy to discuss the results and treatment planning if necessary. CLL: No evidence of disease.   Patient's white blood cell count continues to be within normal limits.  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 PET scan also revealed diffuse bilateral cervical, axillary, retroperitoneal, iliac, and inguinal lymphadenopathy with a low level of metabolic uptake consistent with CLL. 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 Patient initiated Dolores Lory in approximately November 2017 with significant improvement of his B symptoms including his unusual neuropathy symptoms.  Patient has now been on Imbruvica for approximately 5 years and has agreed to discontinue treatment.  If patient had any evidence of recurrence, would reinitiate Imbruvica at which point he would require treatment lifelong. Neuropathy, foot drop: Chronic and unchanged.  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  Patient was previously given a referral to Occupational Therapy to help improve balance. Difficulty swallowing: Patient does not complain of this today.  Appreciate  GI input.  Patient had esophageal dilation on June 13, 2018. Hyperglycemia: Patient has improved blood glucose control. Historically, patient's blood glucose is in 300 range.  Continue follow-up and treatment per primary care. Thrombocytopenia: Patient's platelet count has been within the normal range since November 2021. MRSA bacteremia: Resolved. Bladder outlet obstruction secondary to BPH: Continue follow-up with urology as scheduled.   Patient expressed understanding and was in agreement with this plan. He also understands that He can call clinic at any time with any questions, concerns, or complaints.    Lloyd Huger, MD   11/19/2021 12:28 PM

## 2021-11-17 NOTE — Progress Notes (Signed)
Travis Antigua, MD [address]  [place]  [place], [place] [address]  Main: [phone]  Fax: [phone]   Primary Care Physician: Adin Hector, MD   Chief Complaint  Patient presents with   New Patient (Initial Visit)   Dysphagia    HPI: Kay Ricciuti. is a 70 y.o. male here for posthospitalization follow-up. Patient's appt was scheduled with Dr. Vicente Males, as pt has seen him before, but had to be cancelled as Dr. Vicente Males had to go out of town and was switched to me.   Patient reports that since starting his Protonix, he has had no further heartburn, or dysphagia.  States he used to have to stop what he is eating at [employer] and go to the bathroom, and this has not occurred since he has been taking PPI twice daily since hospital discharge.  However, since being discharged, he has had other new medical findings including a recent CT chest done as an outpatient by PCP showing acute PE and patient is now on Eliquis for this.  A spiculated pleural tail mass was also noted along with other metastatic lung disease.  Patient has an upcoming appointment with Dr. Grayland Ormond to discuss these findings further.  Patient completed his antibiotics for MRSA bacteremia and PICC line has been removed   ROS: All ROS reviewed and negative except as per HPI   Past Medical History:  Diagnosis Date   Diabetes mellitus without complication ([place])    Leukemia ([place])     Past Surgical History:  Procedure Laterality Date   COLONOSCOPY     ESOPHAGOGASTRODUODENOSCOPY (EGD) WITH PROPOFOL N/A 03/14/2018   Procedure: ESOPHAGOGASTRODUODENOSCOPY (EGD) WITH PROPOFOL;  Surgeon: [name], MD;  Location: [facility] ENDOSCOPY;  Service: Gastroenterology;  Laterality: N/A;   ESOPHAGOGASTRODUODENOSCOPY (EGD) WITH PROPOFOL N/A 06/13/2018   Procedure: ESOPHAGOGASTRODUODENOSCOPY (EGD) WITH PROPOFOL;  Surgeon: [name], MD;  Location: [facility] ENDOSCOPY;  Service: Gastroenterology;  Laterality: N/A;    ESOPHAGOGASTRODUODENOSCOPY (EGD) WITH PROPOFOL N/A 10/09/2021   Procedure: ESOPHAGOGASTRODUODENOSCOPY (EGD) WITH PROPOFOL;  Surgeon: Virgel Manifold, MD;  Location: [facility] ENDOSCOPY;  Service: Endoscopy;  Laterality: N/A;   TEE WITHOUT CARDIOVERSION N/A 10/09/2021   Procedure: TRANSESOPHAGEAL ECHOCARDIOGRAM (TEE);  Surgeon: Virgel Manifold, MD;  Location: [facility] ENDOSCOPY;  Service: Endoscopy;  Laterality: N/A;   WISDOM TOOTH EXTRACTION      Prior to Admission medications   Medication Sig Start Date End Date Taking? Authorizing Provider  acetaminophen (TYLENOL) 500 MG tablet Take 500-1,000 mg by mouth every 6 (six) hours as needed for mild pain.   Yes [provider]  amoxicillin (AMOXIL) 500 MG capsule Take 500 mg by mouth 3 (three) times daily.   Yes [provider]  Blood Glucose Monitoring Suppl ([place]) w/Device KIT See admin instructions. 10/23/21  Yes [provider]  Cholecalciferol (VITAMIN D3) 1000 units CAPS Take 1,000 Int'l Units by mouth daily.   Yes [provider]  citalopram (CELEXA) 20 MG tablet Take 40 mg by mouth daily.   Yes [provider]  ELIQUIS 5 MG TABS tablet Take by mouth. 11/14/21  Yes [provider]  ibrutinib (IMBRUVICA) 420 MG TABS TAKE 1 TABLET BY MOUTH DAILY. 07/10/21 07/10/22 Yes Finnegan, Kathlene November, MD  insulin glargine (LANTUS) 100 UNIT/ML Solostar Pen Inject 15 Units into the skin 2 (two) times daily. 10/11/21  Yes Samuella Cota, MD  Insulin Pen Needle 31G X 5 MM MISC Use as directed 10/11/21  Yes Samuella Cota, MD  Lancets (FREESTYLE) lancets Use as instructed 10/11/21  Yes Samuella Cota, MD  metFORMIN (GLUCOPHAGE) 1000 MG tablet Take by mouth. 10/16/21  Yes [provider]  Multiple Vitamin (MULTIVITAMIN WITH MINERALS) TABS tablet Take 1 tablet by mouth daily. 10/11/21  Yes Samuella Cota, MD  [facility] VERIO test strip 2 (two) times daily. use for  testing 10/24/21  Yes [provider]  pantoprazole (PROTONIX) 40 MG tablet Take 1 tablet 2 times daily for 30 days, then 1 tablet once daily thereafter 11/17/21  Yes Virgel Manifold, MD    Family History  Problem Relation Age of Onset   Leukemia Mother    Colon cancer Father    Diabetes Father    Heart disease Father    Diabetes Brother      Social History   Tobacco Use   Smoking status: Never   Smokeless tobacco: Never  Vaping Use   Vaping Use: Never used  Substance Use Topics   Alcohol use: Yes    Comment: rarely   Drug use: No    Allergies as of 11/17/2021   (No Known Allergies)    Physical Examination:  Constitutional: General:   Alert,  Well-developed, well-nourished, pleasant and cooperative in NAD BP (!) 157/96   Pulse 92   Temp 97.7 F (36.5 C) (Oral)   Ht _0  (1.803 m)   Wt 190 lb 9.6 oz (86.5 kg)   BMI 26.58 kg/m   Respiratory: Normal respiratory effort  Gastrointestinal:  Soft, non-tender and non-distended without masses, hepatosplenomegaly or hernias noted.  No guarding or rebound tenderness.     Cardiac: No clubbing or edema.  No cyanosis. Normal posterior tibial pedal pulses noted.  Psych:  Alert and cooperative. Normal mood and affect.  Musculoskeletal:  Normal gait. Head normocephalic, atraumatic. Symmetrical without gross deformities. 5/5 Lower extremity strength bilaterally.  Skin: Warm. Intact without significant lesions or rashes. No jaundice.  Neck: Supple, trachea midline  Lymph: No cervical lymphadenopathy  Psych:  Alert and oriented x3, Alert and cooperative. Normal mood and affect.  Labs: CMP     Component Value Date/Time   NA 138 10/30/2021 1345   K 4.7 10/30/2021 1345   CL 102 10/30/2021 1345   CO2 29 10/30/2021 1345   GLUCOSE 192 (H) 10/30/2021 1345   BUN 21 10/30/2021 1345   CREATININE 1.00 10/30/2021 1345   CALCIUM 8.6 (L) 10/30/2021 1345   PROT 6.0 (L) 10/30/2021 1345   ALBUMIN 3.2 (L)  10/30/2021 1345   AST 12 (L) 10/30/2021 1345   ALT 13 10/30/2021 1345   ALKPHOS 71 10/30/2021 1345   BILITOT 0.7 10/30/2021 1345   GFRNONAA >60 10/30/2021 1345   GFRAA >60 07/05/2020 0822   Lab Results  Component Value Date   WBC 6.4 10/30/2021   HGB 12.5 (L) 10/30/2021   HCT 38.8 (L) 10/30/2021   MCV 86.2 10/30/2021   PLT 224 10/30/2021    Imaging Studies:   Assessment and Plan:   Braxon Suder. is a 70 y.o. y/o male who was recently in the hospital with MRSA bacteremia, and had previous history of dysphagia and EGD was requested by cardiology prior to TEE during that admission  Dysphagia has completely resolved.  EGD had shown esophagitis and therefore PPI was started during that admission  Given significant improvement in symptoms with twice daily PPI, will complete resolution of dysphagia and reflux symptoms, continue PPI at this time  Continue twice  daily PPI therapy for 1 more month and if symptoms remain well controlled, decrease to once daily dosing.  Patient may need indefinite PPI therapy, at least once daily given his significant symptoms without medications leading to esophagitis and need for dilations in the past.  This can be determined on future follow-ups  (Risks of PPI use were discussed with patient including bone loss, C. Diff diarrhea, pneumonia, infections, CKD, electrolyte abnormalities.  Pt. 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  Follow-up EGD can be considered in the future and this area can be reevaluated if needed.  However, given other acute medical issues including recent diagnosis of new PE, lung mass with metastatic lesions, with resolution of dysphagia, would not  recommend repeat EGD at this time.  Avoid NSAID use such as Ibuprofen, Aleeve, advil, motrin, BC and Goodie powder, Naproxen, Meloxicam and others.   Follow-up in clinic with Dr. Vicente Males in 8 to 12 weeks, or earlier if symptoms occur  Patient advised to call the clinic if heartburn or dysphagia reoccurs and he verbalized understanding of the plan  Dr Travis [name]

## 2021-11-19 ENCOUNTER — Other Ambulatory Visit: Payer: Self-pay

## 2021-11-19 ENCOUNTER — Encounter: Payer: Self-pay | Admitting: Oncology

## 2021-11-19 ENCOUNTER — Inpatient Hospital Stay (HOSPITAL_BASED_OUTPATIENT_CLINIC_OR_DEPARTMENT_OTHER): Payer: BC Managed Care – PPO | Admitting: Oncology

## 2021-11-19 VITALS — BP 127/78 | HR 72 | Temp 97.6°F | Resp 16 | Wt 191.5 lb

## 2021-11-19 DIAGNOSIS — R918 Other nonspecific abnormal finding of lung field: Secondary | ICD-10-CM

## 2021-11-19 DIAGNOSIS — C9110 Chronic lymphocytic leukemia of B-cell type not having achieved remission: Secondary | ICD-10-CM

## 2021-11-19 NOTE — Progress Notes (Signed)
Pt reports difficulty sleeping and requesting prescription to help him sleep. Other than wanting to discuss CT results, no concerns at this time.

## 2021-11-24 ENCOUNTER — Ambulatory Visit: Payer: BC Managed Care – PPO | Admitting: Gastroenterology

## 2021-11-25 NOTE — Progress Notes (Signed)
  11/26/2021 11:26 AM   Travis G Jarecki Jr. 11/19/1951 [id number]  Referring provider: Klein, Bert J III, MD [address] [facility] [place],   [address]  Chief Complaint  Patient presents with   Urinary Retention   Urological history 1. Urinary retention -subacute retention in 08/2021 -managed with indwelling Foley and CIC -scheduled for HoLEP in the future  2. High risk hematuria -non-smoker -likely secondary to bladder stretch injury with rapid urinary compression -cysto 09/2021 - NED -PET scan scheduled for 11/2021  3. 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 Trilobar coaptation minimally elevated bladder neck  Retroflexion shows slight intravesical protrusion with mild trabeculated bladder -TRUS 09/2021 - 105.2 gm prostate measuring 5.02 x 6 x 6.07 cm (length)  HPI: Travis G Cooley Jr. Is a 70 y.o. who presents today for catheter removal.  He has been dealing with urinary retention since September and it has been recommended that he undergo HoLEP for his bladder outlet procedure.    He is willing to move forward with this procedure at this time.  Foley has been draining clear yellow urine.  Patient denies any modifying or aggravating factors.  Patient denies any gross hematuria, dysuria or suprapubic/flank pain.  Patient denies any fevers, chills, nausea or vomiting.    UA > 30 WBC's and few bacteria.    PMH: Past Medical History:  Diagnosis Date   Diabetes mellitus without complication (HCC)    Leukemia (HCC)     Surgical History: Past Surgical History:  Procedure Laterality Date   COLONOSCOPY     ESOPHAGOGASTRODUODENOSCOPY (EGD) WITH PROPOFOL N/A 03/14/2018   Procedure: ESOPHAGOGASTRODUODENOSCOPY (EGD) WITH PROPOFOL;  Surgeon: Anna, Kiran, MD;  Location: [facility] ENDOSCOPY;  Service: Gastroenterology;  Laterality: N/A;   ESOPHAGOGASTRODUODENOSCOPY (EGD) WITH PROPOFOL N/A 06/13/2018   Procedure:  ESOPHAGOGASTRODUODENOSCOPY (EGD) WITH PROPOFOL;  Surgeon: Anna, Kiran, MD;  Location: [facility] ENDOSCOPY;  Service: Gastroenterology;  Laterality: N/A;   ESOPHAGOGASTRODUODENOSCOPY (EGD) WITH PROPOFOL N/A 10/09/2021   Procedure: ESOPHAGOGASTRODUODENOSCOPY (EGD) WITH PROPOFOL;  Surgeon: Tahiliani, Varnita B, MD;  Location: [facility] ENDOSCOPY;  Service: Endoscopy;  Laterality: N/A;   TEE WITHOUT CARDIOVERSION N/A 10/09/2021   Procedure: TRANSESOPHAGEAL ECHOCARDIOGRAM (TEE);  Surgeon: Tahiliani, Varnita B, MD;  Location: [facility] ENDOSCOPY;  Service: Endoscopy;  Laterality: N/A;   WISDOM TOOTH EXTRACTION      Home Medications:  Allergies as of 11/26/2021   No Known Allergies      Medication List        Accurate as of November 26, 2021 11:26 AM. If you have any questions, ask your nurse or doctor.          acetaminophen 500 MG tablet Commonly known as: TYLENOL Take 500-1,000 mg by mouth every 6 (six) hours as needed for mild pain.   amoxicillin 500 MG capsule Commonly known as: AMOXIL Take 500 mg by mouth 3 (three) times daily.   citalopram 20 MG tablet Commonly known as: CELEXA Take 40 mg by mouth daily.   Eliquis 5 MG Tabs tablet Generic drug: apixaban Take by mouth.   freestyle lancets Use as instructed   Imbruvica 420 MG Tabs Generic drug: ibrutinib TAKE 1 TABLET BY MOUTH DAILY.   insulin glargine 100 UNIT/ML Solostar Pen Commonly known as: LANTUS Inject 15 Units into the skin 2 (two) times daily.   Insulin Pen Needle 31G X 5 MM Misc Use as directed   metFORMIN 1000 MG tablet Commonly known as: GLUCOPHAGE Take by   mouth.   multivitamin with minerals Tabs tablet Take 1 tablet by mouth daily.   OneTouch Verio Flex System w/Device Kit See admin instructions.   OneTouch Verio test strip Generic drug: glucose blood 2 (two) times daily. use for testing   pantoprazole 40 MG tablet Commonly known as: PROTONIX Take 1 tablet 2 times daily for 30 days, then 1 tablet once  daily thereafter   Vitamin D3 25 MCG (1000 UT) Caps Take 1,000 Int'l Units by mouth daily.        Allergies: No Known Allergies  Family History: Family History  Problem Relation Age of Onset   Leukemia Mother    Colon cancer Father    Diabetes Father    Heart disease Father    Diabetes Brother     Social History:  reports that he has never smoked. He has never used smokeless tobacco. He reports current alcohol use. He reports that he does not use drugs.  ROS: Pertinent ROS in HPI  Physical Exam: Constitutional:  Well nourished. Alert and oriented, No acute distress. HEENT: [place] AT, mask in place.  Trachea midline Cardiovascular: No clubbing, cyanosis, or edema. Respiratory: Normal respiratory effort, no increased work of breathing. Neurologic: Grossly intact, no focal deficits, moving all 4 extremities. Psychiatric: Normal mood and affect.  Laboratory Data: Lab Results  Component Value Date   WBC 6.4 10/30/2021   HGB 12.5 (L) 10/30/2021   HCT 38.8 (L) 10/30/2021   MCV 86.2 10/30/2021   PLT 224 10/30/2021    Lab Results  Component Value Date   CREATININE 1.00 10/30/2021    Lab Results  Component Value Date   HGBA1C 11.4 (H) 10/06/2021    Lab Results  Component Value Date   AST 12 (L) 10/30/2021   Lab Results  Component Value Date   ALT 13 10/30/2021    Urinalysis > 30 WBC's and few bacteria.  I have reviewed the labs.   Pertinent Imaging: N/A  Catheter Removal Patient is present today for a catheter removal.  10 ml of water was drained from the balloon.  A 16 FR foley cath was removed from the bladder no complications were noted . Patient tolerated well.  Assessment & Plan:    1. Acute urinary retention -Foley removed  -resume CIC -scheduled for HoLEP  -UA -Urine culture   Return for schedule for HoLEP .  These notes generated with voice recognition software. I apologize for typographical errors.  Jadarious Dobbins,  PA-C  [facility] [address]  [address] [place], [place] [address] [phone]   

## 2021-11-25 NOTE — H&P (View-Only) (Signed)
11/26/2021 11:26 AM   [name]. 1951/04/04 [id number]  Referring provider: Adin Hector, MD [facility] [place],  [place] [address]  Chief Complaint  Patient presents with   Urinary Retention   Urological history 1. Urinary retention -subacute retention in 08/2021 -managed with indwelling Foley and CIC -scheduled for HoLEP in the future  2. High risk hematuria -non-smoker -likely secondary to bladder stretch injury with rapid urinary compression -cysto 09/2021 - NED -PET scan scheduled for 11/2021  3. 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 Trilobar coaptation minimally elevated bladder neck  Retroflexion shows slight intravesical protrusion with mild trabeculated bladder -TRUS 09/2021 - 105.2 gm prostate measuring 5.02 x 6 x 6.07 cm (length)  HPI: Travis [name]. Is a 70 y.o. who presents today for catheter removal.  He has been dealing with urinary retention since September and it has been recommended that he undergo HoLEP for his bladder outlet procedure.    He is willing to move forward with this procedure at this time.  Foley has been draining clear yellow urine.  Patient denies any modifying or aggravating factors.  Patient denies any gross hematuria, dysuria or suprapubic/flank pain.  Patient denies any fevers, chills, nausea or vomiting.    UA > 30 WBC's and few bacteria.    PMH: Past Medical History:  Diagnosis Date   Diabetes mellitus without complication ([place])    Leukemia ([place])     Surgical History: Past Surgical History:  Procedure Laterality Date   COLONOSCOPY     ESOPHAGOGASTRODUODENOSCOPY (EGD) WITH PROPOFOL N/A 03/14/2018   Procedure: ESOPHAGOGASTRODUODENOSCOPY (EGD) WITH PROPOFOL;  Surgeon: [name], MD;  Location: [facility] ENDOSCOPY;  Service: Gastroenterology;  Laterality: N/A;   ESOPHAGOGASTRODUODENOSCOPY (EGD) WITH PROPOFOL N/A 06/13/2018   Procedure:  ESOPHAGOGASTRODUODENOSCOPY (EGD) WITH PROPOFOL;  Surgeon: [name], MD;  Location[facility] ENDOSCOPY;  Service: Gastroenterology;  Laterality: N/A;   ESOPHAGOGASTRODUODENOSCOPY (EGD) WITH PROPOFOL N/A 10/09/2021   Procedure: ESOPHAGOGASTRODUODENOSCOPY (EGD) WITH PROPOFOL;  Surgeon: Virgel Manifold, MD;  Location: [facility] ENDOSCOPY;  Service: Endoscopy;  Laterality: N/A;   TEE WITHOUT CARDIOVERSION N/A 10/09/2021   Procedure: TRANSESOPHAGEAL ECHOCARDIOGRAM (TEE);  Surgeon: Virgel Manifold, MD;  Location: [facility] ENDOSCOPY;  Service: Endoscopy;  Laterality: N/A;   WISDOM TOOTH EXTRACTION      Home Medications:  Allergies as of 11/26/2021   No Known Allergies      Medication List        Accurate as of November 26, 2021 11:26 AM. If you have any questions, ask your nurse or doctor.          acetaminophen 500 MG tablet Commonly known as: TYLENOL Take 500-1,000 mg by mouth every 6 (six) hours as needed for mild pain.   amoxicillin 500 MG capsule Commonly known as: AMOXIL Take 500 mg by mouth 3 (three) times daily.   citalopram 20 MG tablet Commonly known as: CELEXA Take 40 mg by mouth daily.   Eliquis 5 MG Tabs tablet Generic drug: apixaban Take by mouth.   freestyle lancets Use as instructed   Imbruvica 420 MG Tabs Generic drug: ibrutinib TAKE 1 TABLET BY MOUTH DAILY.   insulin glargine 100 UNIT/ML Solostar Pen Commonly known as: LANTUS Inject 15 Units into the skin 2 (two) times daily.   Insulin Pen Needle 31G X 5 MM Misc Use as directed   metFORMIN 1000 MG tablet Commonly known as: GLUCOPHAGE Take by  mouth.   multivitamin with minerals Tabs tablet Take 1 tablet by mouth daily.   OneTouch Verio Flex System w/Device Kit See admin instructions.   OneTouch Verio test strip Generic drug: glucose blood 2 (two) times daily. use for testing   pantoprazole 40 MG tablet Commonly known as: PROTONIX Take 1 tablet 2 times daily for 30 days, then 1 tablet once  daily thereafter   Vitamin D3 25 MCG (1000 UT) Caps Take 1,000 Int'l Units by mouth daily.        Allergies: No Known Allergies  Family History: Family History  Problem Relation Age of Onset   Leukemia Mother    Colon cancer Father    Diabetes Father    Heart disease Father    Diabetes Brother     Social History:  reports that he has never smoked. He has never used smokeless tobacco. He reports current alcohol use. He reports that he does not use drugs.  ROS: Pertinent ROS in HPI  Physical Exam: Constitutional:  Well nourished. Alert and oriented, No acute distress. HEENT: [place] AT, mask in place.  Trachea midline Cardiovascular: No clubbing, cyanosis, or edema. Respiratory: Normal respiratory effort, no increased work of breathing. Neurologic: Grossly intact, no focal deficits, moving all 4 extremities. Psychiatric: Normal mood and affect.  Laboratory Data: Lab Results  Component Value Date   WBC 6.4 10/30/2021   HGB 12.5 (L) 10/30/2021   HCT 38.8 (L) 10/30/2021   MCV 86.2 10/30/2021   PLT 224 10/30/2021    Lab Results  Component Value Date   CREATININE 1.00 10/30/2021    Lab Results  Component Value Date   HGBA1C 11.4 (H) 10/06/2021    Lab Results  Component Value Date   AST 12 (L) 10/30/2021   Lab Results  Component Value Date   ALT 13 10/30/2021    Urinalysis > 30 WBC's and few bacteria.  I have reviewed the labs.   Pertinent Imaging: N/A  Catheter Removal Patient is present today for a catheter removal.  10 ml of water was drained from the balloon.  A 16 FR foley cath was removed from the bladder no complications were noted . Patient tolerated well.  Assessment & Plan:    1. Acute urinary retention -Foley removed  -resume CIC -scheduled for HoLEP  -UA -Urine culture   Return for schedule for HoLEP .  These notes generated with voice recognition software. I apologize for typographical errors.  Zara Council,  PA-C  [facility] [address]  [place], [place] [address] [phone]

## 2021-11-26 ENCOUNTER — Encounter: Payer: Self-pay | Admitting: Urology

## 2021-11-26 ENCOUNTER — Ambulatory Visit (INDEPENDENT_AMBULATORY_CARE_PROVIDER_SITE_OTHER): Payer: BC Managed Care – PPO | Admitting: Urology

## 2021-11-26 ENCOUNTER — Other Ambulatory Visit: Payer: Self-pay

## 2021-11-26 DIAGNOSIS — N138 Other obstructive and reflux uropathy: Secondary | ICD-10-CM | POA: Diagnosis not present

## 2021-11-26 DIAGNOSIS — N401 Enlarged prostate with lower urinary tract symptoms: Secondary | ICD-10-CM | POA: Diagnosis not present

## 2021-11-26 DIAGNOSIS — R338 Other retention of urine: Secondary | ICD-10-CM

## 2021-11-26 LAB — MICROSCOPIC EXAMINATION
Epithelial Cells (non renal): NONE SEEN /hpf (ref 0–10)
WBC, UA: >30 /hpf — AB (ref 0–5)

## 2021-11-26 LAB — URINALYSIS, COMPLETE
Bilirubin, UA: NEGATIVE
Ketones, UA: NEGATIVE
Nitrite, UA: NEGATIVE
Protein,UA: NEGATIVE
RBC, UA: NEGATIVE
Specific Gravity, UA: 1.02 (ref 1.005–1.030)
Urobilinogen, Ur: 0.2 mg/dL (ref 0.2–1.0)
pH, UA: 6 (ref 5.0–7.5)

## 2021-11-28 NOTE — Progress Notes (Signed)
[facility] Posting Form   Surgery Date/Time: Date: 12/08/2021  Surgeon: Dr. Hollice Espy, MD  Surgery Location: Day Surgery  Inpt ( No  )   Outpt (Yes)   Obs ( No  )   Diagnosis: BPH w/ Urinary Obstruction  -CPT: 09381  Surgery: HOLEP  Stop Anticoagulations: Yes  Cardiac/Medical/Pulmonary Clearance needed: no  *Orders entered into EPIC  Date: 11/28/21   *Case booked in [place]  Date: 11/26/2021  *Notified pt of Surgery: Date: 11/26/2021  PRE-OP UA & CX: Yes, collected on 11/26/2021   *Placed into Prior Authorization Work Fabio Bering Date: 11/28/21   Assistant/laser/rep:No

## 2021-12-02 ENCOUNTER — Encounter
Admission: RE | Admit: 2021-12-02 | Discharge: 2021-12-02 | Disposition: A | Discharge status: Home / Self Care | Payer: BC Managed Care – PPO | Source: Ambulatory Visit | Attending: Urology | Admitting: Urology

## 2021-12-02 ENCOUNTER — Other Ambulatory Visit: Payer: Self-pay

## 2021-12-02 DIAGNOSIS — Z01818 Encounter for other preprocedural examination: Secondary | ICD-10-CM | POA: Insufficient documentation

## 2021-12-02 HISTORY — DX: Pneumonia, unspecified organism: J18.9

## 2021-12-02 HISTORY — DX: Anxiety disorder, unspecified: F41.9

## 2021-12-02 HISTORY — DX: Depression, unspecified: F32.A

## 2021-12-02 NOTE — Patient Instructions (Addendum)
Your procedure is scheduled on: 12/08/21 - Monday Report to the Registration Desk on the 1st floor of the [facility]. To find out your arrival time, please call [phone] between 1PM - 3PM on: 12/05/21 - Friday  REMEMBER: Instructions that are not followed completely may result in serious medical risk, up to and including death; or upon the discretion of your surgeon and anesthesiologist your surgery may need to be rescheduled.  Do not eat food or drink any fluids after midnight the night before surgery.  No gum chewing, lozengers or hard candies.   TAKE THESE MEDICATIONS THE MORNING OF SURGERY WITH A SIP OF WATER:  - citalopram (CELEXA) 20 MG tablet  - pantoprazole (PROTONIX) 40 MG tablet- (take one the night before and one on the morning of surgery - helps to prevent nausea after surgery.)   - insulin detemir (LEVEMIR) 100 UNIT/ML injection, the night before your procedure only take 1/2 of your prescribed Insulin, and the morning of you will not inject any.  - metFORMIN (GLUCOPHAGE) 1000 MG tablet do not take 12/10, 12/11, and do not take the day of surgery.   Follow recommendations from Cardiologist, Pulmonologist or PCP regarding stopping Aspirin, Coumadin, Plavix, Eliquis, Pradaxa, or Pletal.  One week prior to surgery: Stop Anti-inflammatories (NSAIDS) such as Advil, Aleve, Ibuprofen, Motrin, Naproxen, Naprosyn and Aspirin based products such as Excedrin, Goodys Powder, BC Powder.  Stop ANY OVER THE COUNTER supplements until after surgery.  You may however, continue to take Tylenol if needed for pain up until the day of surgery.  No Alcohol for 24 hours before or after surgery.  No Smoking including e-cigarettes for 24 hours prior to surgery.  No chewable tobacco products for at least 6 hours prior to surgery.  No nicotine patches on the day of surgery.  Do not use any "recreational" drugs for at least a week prior to your surgery.  Please be advised that the  combination of cocaine and anesthesia may have negative outcomes, up to and including death. If you test positive for cocaine, your surgery will be cancelled.  On the morning of surgery brush your teeth with toothpaste and water, you may rinse your mouth with mouthwash if you wish. Do not swallow any toothpaste or mouthwash.  Take shower/bath the morning of surgery, you may apply deodorant the morning of surgery.  Do not wear jewelry, make-up, hairpins, clips or nail polish.  Do not wear lotions, powders, or perfumes.   Do not shave body from the neck down 48 hours prior to surgery just in case you cut yourself which could leave a site for infection.  Also, freshly shaved skin may become irritated if using the CHG soap.  Contact lenses, hearing aids and dentures may not be worn into surgery.  Do not bring valuables to the hospital. [facility] is not responsible for any missing/lost belongings or valuables.   Notify your doctor if there is any change in your medical condition (cold, fever, infection).  Wear comfortable clothing (specific to your surgery type) to the hospital.  After surgery, you can help prevent lung complications by doing breathing exercises.  Take deep breaths and cough every 1-2 hours. Your doctor may order a device called an Incentive Spirometer to help you take deep breaths. When coughing or sneezing, hold a pillow firmly against your incision with both hands. This is called "splinting." Doing this helps protect your incision. It also decreases belly discomfort.  If you are being admitted to the  hospital overnight, leave your suitcase in the car. After surgery it may be brought to your room.  If you are being discharged the day of surgery, you will not be allowed to drive home. You will need a responsible adult (18 years or older) to drive you home and stay with you that night.   If you are taking public transportation, you will need to have a responsible adult  (18 years or older) with you. Please confirm with your physician that it is acceptable to use public transportation.   Please call the [facility] at [phone] if you have any questions about these instructions.  Surgery Visitation Policy:  Patients undergoing a surgery or procedure may have one family member or support person with them as long as that person is not COVID-19 positive or experiencing its symptoms.  That person may remain in the waiting area during the procedure and may rotate out with other people.  Inpatient Visitation:    Visiting hours are 7 a.m. to 8 p.m. Up to two visitors ages 16+ are allowed at one time in a patient room. The visitors may rotate out with other people during the day. Visitors must check out when they leave, or other visitors will not be allowed. One designated support person may remain overnight. The visitor must pass COVID-19 screenings, use hand sanitizer when entering and exiting the patient's room and wear a mask at all times, including in the patient's room. Patients must also wear a mask when staff or their visitor are in the room. Masking is required regardless of vaccination status.

## 2021-12-02 NOTE — Progress Notes (Signed)
Patient: Travis [name]. Howell Pringle.  DOB 02-09-1951 MRN [id number]   Dr. Caryl Comes,  The above stated patient is having HOLEP procedure performed at [facility] on 11/28/2021. Dr. Erlene Quan will perform this procedure using General anesthesia . Dr.Brandon  is asking for clearance to hold Eliquis before the scheduled procedure.  Please send back supporting documentation of this approval request.  Thank you, Gerald Leitz, CMA Deborra Medina) [facility] Dr. Hollice Espy, MD

## 2021-12-03 ENCOUNTER — Ambulatory Visit
Admission: RE | Admit: 2021-12-03 | Discharge: 2021-12-03 | Disposition: A | Discharge status: Home / Self Care | Payer: BC Managed Care – PPO | Source: Ambulatory Visit | Attending: Oncology | Admitting: Oncology

## 2021-12-03 DIAGNOSIS — R161 Splenomegaly, not elsewhere classified: Secondary | ICD-10-CM | POA: Diagnosis not present

## 2021-12-03 DIAGNOSIS — K449 Diaphragmatic hernia without obstruction or gangrene: Secondary | ICD-10-CM | POA: Insufficient documentation

## 2021-12-03 DIAGNOSIS — R590 Localized enlarged lymph nodes: Secondary | ICD-10-CM | POA: Diagnosis not present

## 2021-12-03 DIAGNOSIS — I7121 Aneurysm of the ascending aorta, without rupture: Secondary | ICD-10-CM | POA: Diagnosis not present

## 2021-12-03 DIAGNOSIS — C9110 Chronic lymphocytic leukemia of B-cell type not having achieved remission: Secondary | ICD-10-CM | POA: Diagnosis present

## 2021-12-03 DIAGNOSIS — R918 Other nonspecific abnormal finding of lung field: Secondary | ICD-10-CM | POA: Insufficient documentation

## 2021-12-03 DIAGNOSIS — I2510 Atherosclerotic heart disease of native coronary artery without angina pectoris: Secondary | ICD-10-CM | POA: Diagnosis not present

## 2021-12-03 DIAGNOSIS — N400 Enlarged prostate without lower urinary tract symptoms: Secondary | ICD-10-CM | POA: Insufficient documentation

## 2021-12-03 DIAGNOSIS — I700 Atherosclerosis of aorta: Secondary | ICD-10-CM | POA: Diagnosis not present

## 2021-12-03 LAB — GLUCOSE, CAPILLARY: Glucose-Capillary: 156 mg/dL — ABNORMAL HIGH (ref 70–99)

## 2021-12-03 IMAGING — CT NM PET TUM IMG INITIAL (PI) SKULL BASE T - THIGH
10 series · 20 of 25 positions shown · non-contrast
Comparison: CT chest [DATE]

CLINICAL DATA: Initial treatment strategy for leukemia, right lower
lobe nodule.

EXAM:
NUCLEAR MEDICINE PET SKULL BASE TO THIGH
TECHNIQUE: 10.4 mCi F-18 FDG was injected intravenously. Full-ring PET imaging
was performed from the skull base to thigh after the radiotracer. CT
data was obtained and used for attenuation correction and anatomic
localization.
Fasting blood glucose: 156 mg/dl

[Series 3: ct wb 5.0 b30f · axial · 5.0mm · 0.98mm/px · z∈[-1309,-817]mm · 2 of 329 slices shown]
[im 1/329]
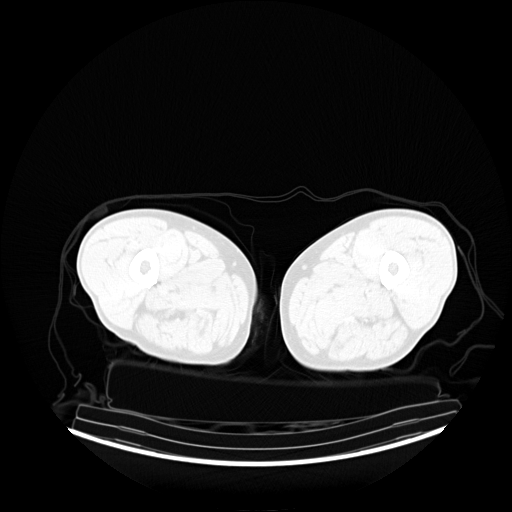
[im 165/329]
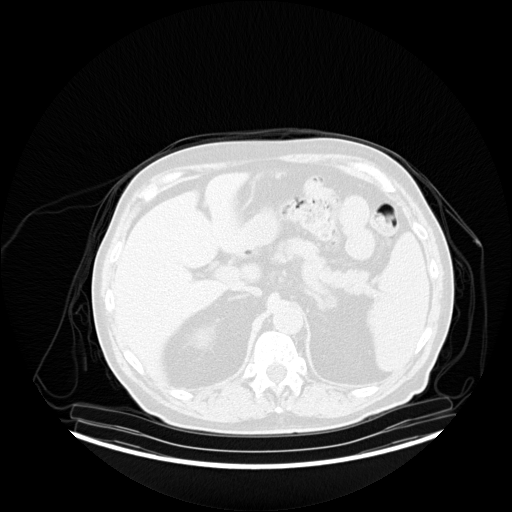

[Series 5: pet wb uncorrected (nac) · axial · 5.0mm · 4.07mm/px · z∈[-1309,-325]mm · 3 of 329 slices shown]
[im 1/329]
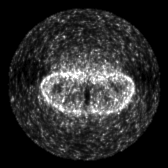
[im 165/329]
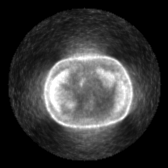
[im 329/329]
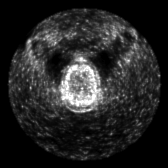

[Series 6: pet wb (ac) · axial · 5.0mm · 3.13mm/px · z∈[-1309,-325]mm · 2 of 329 slices shown]
[im 1/329]
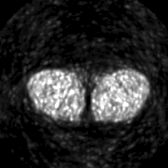
[im 329/329]
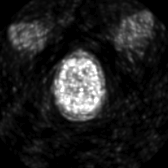

[Series 603: fused axial · 3 of 327 slices shown]
[im 1/327]
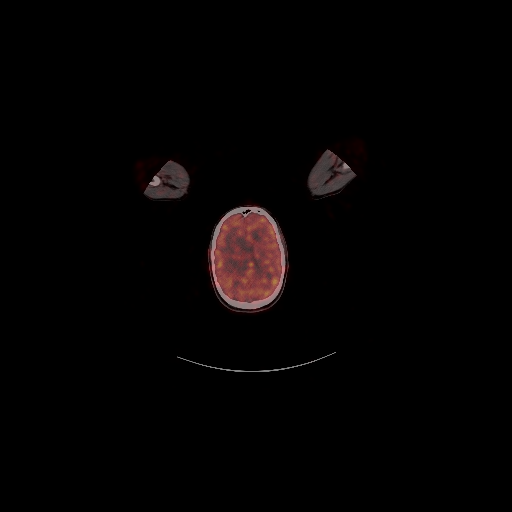
[im 109/327]
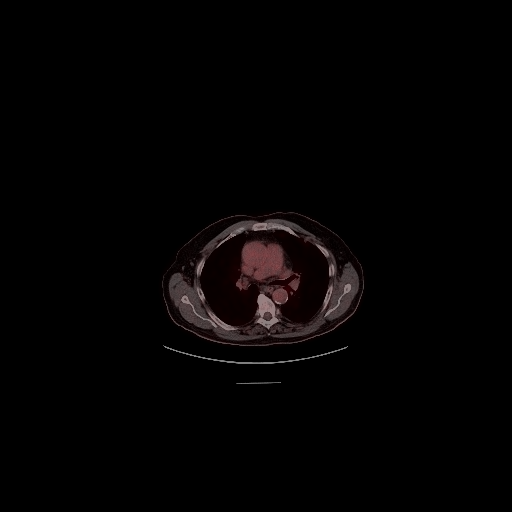
[im 218/327]
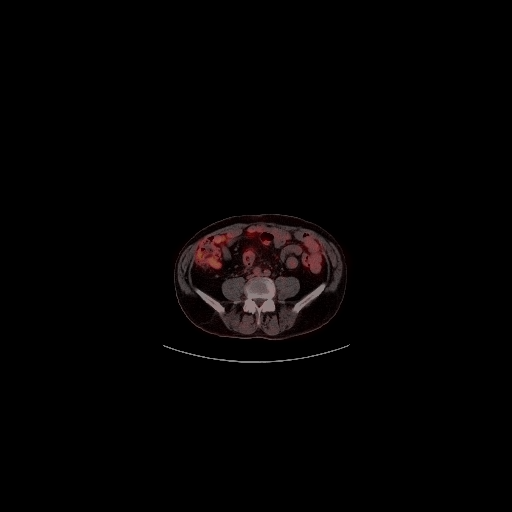

[Series 604: fused coronal · 1 of 111 slices shown]
[im 1/111]
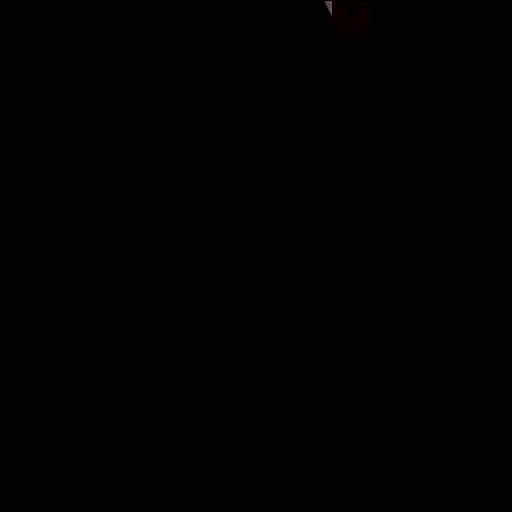

[Series 605: fused sagittal · 2 of 153 slices shown]
[im 1/153]
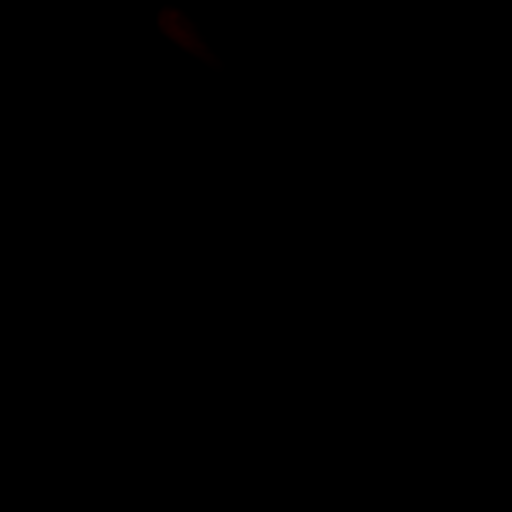
[im 153/153]
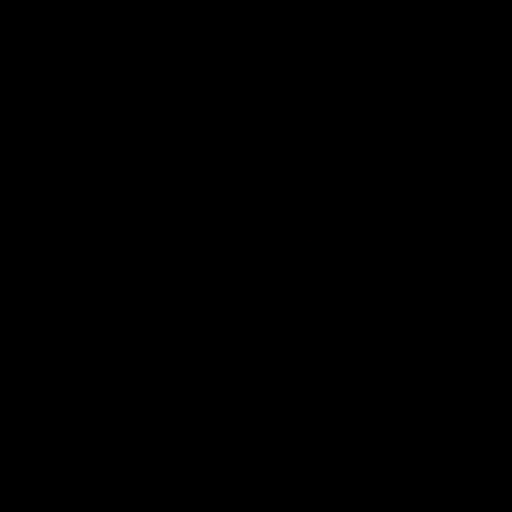

[Series 606: pet axial · 3 of 328 slices shown]
[im 1/328]
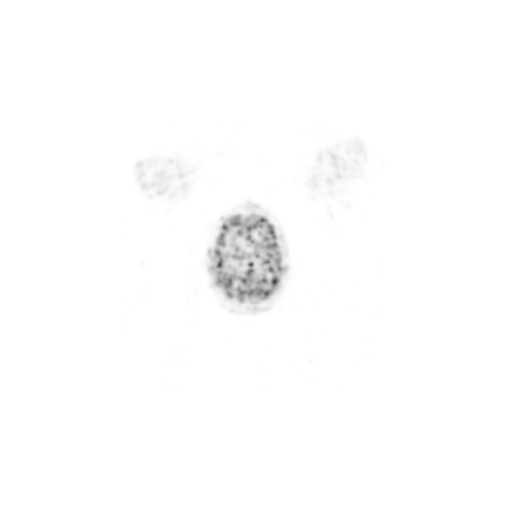
[im 219/328]
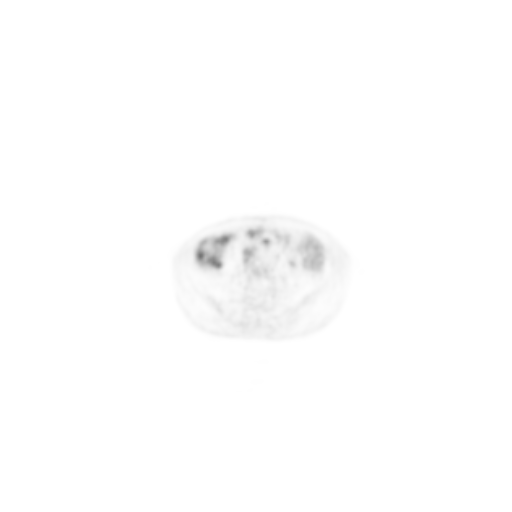
[im 328/328]
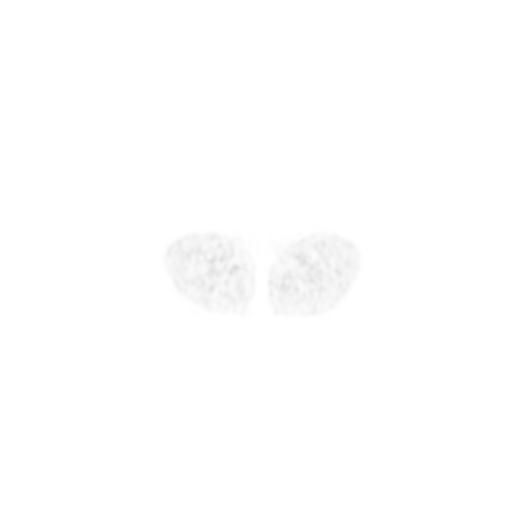

[Series 607: pet coronal · 2 of 134 slices shown]
[im 1/134]
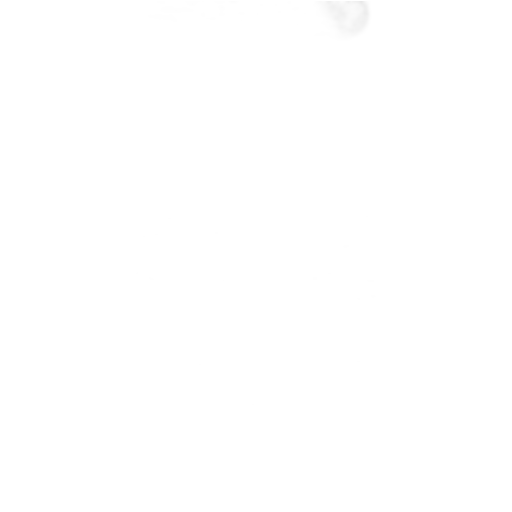
[im 134/134]
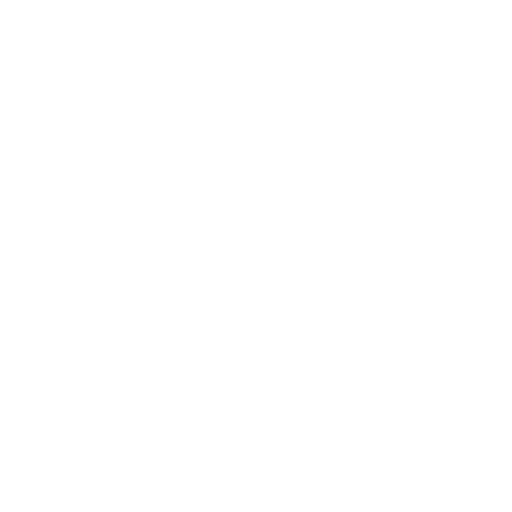

[Series 608: pet sagittal · 1 of 158 slices shown]
[im 158/158]
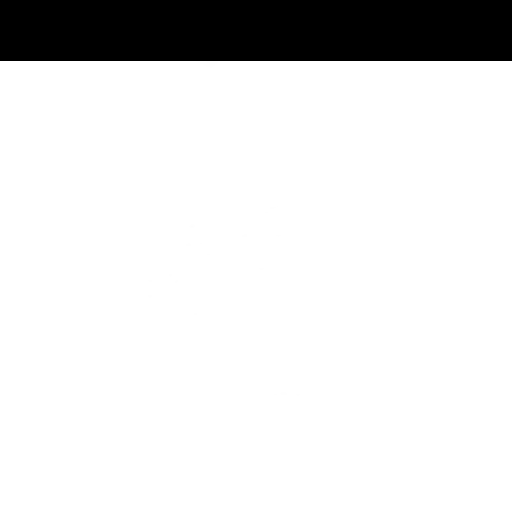

[Series 1121: results mm oncology reading · 5.0mm · 0.82mm/px · 1 of 18 slices shown]
[im 1/18]
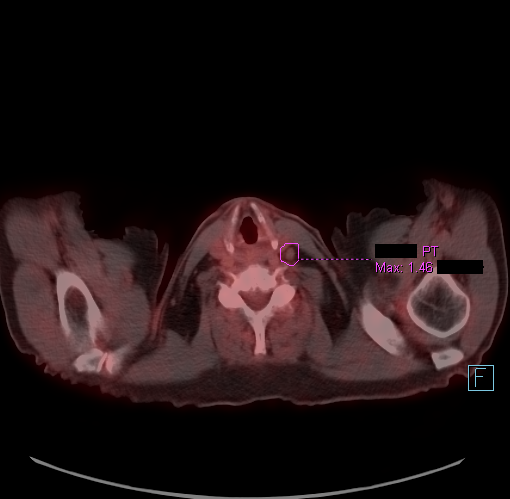

[20 of 25 positions shown; findings below may reference images not displayed]

FINDINGS: Mediastinal blood pool activity: SUV max

Liver activity: SUV max

NECK: Small lymph nodes in the neck are not substantially
hypermetabolic. A left level III lymph node measuring 0.6 cm in
short axis on image 65 of series 3 has maximum SUV of 1.5, [HOSPITAL]
2.

Incidental CT findings: none

CHEST: Somewhat indistinctly marginated right axillary lymph node
1.2 cm in short axis on image 95 series 3 (formerly measured at
cm), maximum SUV 1.8, [HOSPITAL] 2. The subcarinal node mentioned on
the prior exam currently measures 0.8 cm in short axis on image 114
series 3 and has a maximum SUV of 1.7, [HOSPITAL] 2.

Bilateral pulmonary nodules are generally reduced in size from
prior. Bilobed right lower lobe nodule 1.8 by 1.2 cm on image 114
series 3 with maximum SUV 1.3 ([HOSPITAL] 2, formerly 2.2 by 1.5 cm
on [DATE]. A right upper lobe ill-defined pulmonary nodule
measuring 0.9 by 0.9 cm on image 92 series 3 has maximum SUV of
([HOSPITAL] 2) and previously measured 1.0 by 1.1 cm with previous
internal cavitation which is no longer present.

The other various pulmonary nodules in general appear smaller and
have less sharply defined margins. None of these are substantially
more hypermetabolic than the index nodules measured above.

Incidental CT findings: The trace left pleural effusion has
resolved. Coronary, aortic arch, and branch vessel atherosclerotic
vascular disease. Ascending thoracic aortic aneurysm 4.9 cm in
diameter as measured on prior exam. Upper normal heart size. Small
type 1 hiatal hernia.

ABDOMEN/PELVIS: Activity in the stomach antrum without CT correlate,
maximum SUV 5.0, [HOSPITAL] 4. 0.8 cm in short axis left common iliac
node on image 220 series 3 with maximum SUV 1.1, [HOSPITAL] 2. Left
external iliac node 1.1 cm in short axis on image 265 series 3 with
maximum SUV 1.3, [HOSPITAL] 2. The spleen measures 12.2 by 6.3 by
15.0 cm (volume = 600 cm^3), compatible with mild splenomegaly,
but no focal abnormal splenic activity is identified.

There is abnormal focal wall thickening in the sigmoid colon
associated with stranding in the mesentery and what appears to be an
inflamed diverticulum on image 208 of series 3, compatible with mild
to moderate active diverticulitis. Maximum SUV in this vicinity is
6.2, probably inflammatory, although correlation with the patient's
colon cancer screening history is recommended.

Incidental CT findings: Atherosclerosis is present, including
aortoiliac atherosclerotic disease. Prostatomegaly.

SKELETON: No significant abnormal hypermetabolic activity in this
region.

Incidental CT findings: Thoracic spondylosis.
IMPRESSION: 1. The lung nodules have reduced in size compared to [DATE], and
generally have mildly reduced conspicuity of margins and low
([HOSPITAL] 2) activity. One of the nodules was previously cavitary
but no longer. Possibilities include infectious/inflammatory nodules
or malignant involvement with response to interval therapy.
2. The right axillary lymph node has reduced in size, currently
cm in short axis with [HOSPITAL] 2 activity.
3. There is mild splenomegaly but no focal abnormal splenic lesion
identified. Be physiologic.
4. Suspected sigmoid colon diverticulitis with inflamed
diverticulum, stranding in the mesentery, and maximum SUV in the
region of 6.2. This is likely all inflammatory. Correlation with the
patient's colon cancer screening history is recommended. If
screening is not up-to-date, appropriate screening should be
considered following resolution of the patient's acute process.
5. Accentuated activity in the stomach antrum without CT correlate,
[HOSPITAL] 4. This is nonspecific but could well be physiologic.
6. Mildly enlarged left external iliac lymph node 1.1 cm in short
axis, [HOSPITAL] 2.
7. 4.9 cm ascending thoracic aortic aneurysm. Ascending thoracic
aortic aneurysm. Recommend semi-annual imaging followup by CTA or
MRA and referral to cardiothoracic surgery if not already obtained.
This recommendation follows [BS]
ACCF/AHA/AATS/ACR/ASA/SCA/VU/VU/VU/VU Guidelines for the
Diagnosis and Management of Patients With Thoracic Aortic Disease.
Circulation. [BS]; 121: E266-e369. Aortic aneurysm NOS ([BS]-[BS])
8. Other imaging findings of potential clinical significance:
Interval resolution of prior trace left pleural effusion. Aortic
Atherosclerosis ([BS]-[BS]). Coronary atherosclerosis. Systemic
atherosclerosis. Prostatomegaly. Small type 1 hiatal hernia.

## 2021-12-03 MED ORDER — FLUDEOXYGLUCOSE F - 18 (FDG) INJECTION
9.9000 | Freq: Once | INTRAVENOUS | Status: AC | PRN
  Administered 2021-12-03: 10.4 via INTRAVENOUS

## 2021-12-05 ENCOUNTER — Other Ambulatory Visit: Payer: Self-pay | Admitting: *Deleted

## 2021-12-05 LAB — CULTURE, URINE COMPREHENSIVE

## 2021-12-05 MED ORDER — SULFAMETHOXAZOLE-TRIMETHOPRIM 800-160 MG PO TABS: 1.0000 | ORAL_TABLET | Freq: Two times a day (BID) | ORAL | 0 refills | Status: AC

## 2021-12-07 MED ORDER — SODIUM CHLORIDE 0.9 % IV SOLN
INTRAVENOUS | Status: DC
  Administered 2021-12-08: 100 mL/h via INTRAVENOUS

## 2021-12-07 MED ORDER — LACTATED RINGERS IV SOLN: INTRAVENOUS | Status: DC

## 2021-12-07 MED ORDER — CHLORHEXIDINE GLUCONATE 0.12 % MT SOLN: 15.0000 mL | Freq: Once | OROMUCOSAL | Status: AC

## 2021-12-07 MED ORDER — ORAL CARE MOUTH RINSE: 15.0000 mL | Freq: Once | OROMUCOSAL | Status: AC

## 2021-12-07 MED ORDER — CEFAZOLIN SODIUM-DEXTROSE 2-4 GM/100ML-% IV SOLN
2.0000 g | INTRAVENOUS | Status: AC
  Administered 2021-12-08: 2 g via INTRAVENOUS

## 2021-12-08 ENCOUNTER — Other Ambulatory Visit: Payer: Self-pay

## 2021-12-08 ENCOUNTER — Ambulatory Visit: Payer: BC Managed Care – PPO | Admitting: Urgent Care

## 2021-12-08 ENCOUNTER — Ambulatory Visit
Admission: RE | Admit: 2021-12-08 | Discharge: 2021-12-08 | Disposition: A | Discharge status: Home / Self Care | Payer: BC Managed Care – PPO | Attending: Urology | Admitting: Urology

## 2021-12-08 ENCOUNTER — Encounter
Admission: RE | Disposition: A | Discharge status: Home / Self Care | Payer: Self-pay | Source: Home / Self Care | Attending: Urology

## 2021-12-08 ENCOUNTER — Encounter: Payer: Self-pay | Admitting: Urology

## 2021-12-08 DIAGNOSIS — Z856 Personal history of leukemia: Secondary | ICD-10-CM | POA: Insufficient documentation

## 2021-12-08 DIAGNOSIS — Z7984 Long term (current) use of oral hypoglycemic drugs: Secondary | ICD-10-CM | POA: Insufficient documentation

## 2021-12-08 DIAGNOSIS — E1165 Type 2 diabetes mellitus with hyperglycemia: Secondary | ICD-10-CM | POA: Diagnosis not present

## 2021-12-08 DIAGNOSIS — N4289 Other specified disorders of prostate: Secondary | ICD-10-CM | POA: Insufficient documentation

## 2021-12-08 DIAGNOSIS — R319 Hematuria, unspecified: Secondary | ICD-10-CM | POA: Diagnosis not present

## 2021-12-08 DIAGNOSIS — K219 Gastro-esophageal reflux disease without esophagitis: Secondary | ICD-10-CM | POA: Diagnosis not present

## 2021-12-08 DIAGNOSIS — N138 Other obstructive and reflux uropathy: Secondary | ICD-10-CM | POA: Diagnosis not present

## 2021-12-08 DIAGNOSIS — Z86711 Personal history of pulmonary embolism: Secondary | ICD-10-CM | POA: Insufficient documentation

## 2021-12-08 DIAGNOSIS — Z794 Long term (current) use of insulin: Secondary | ICD-10-CM | POA: Diagnosis not present

## 2021-12-08 DIAGNOSIS — Z7901 Long term (current) use of anticoagulants: Secondary | ICD-10-CM | POA: Insufficient documentation

## 2021-12-08 DIAGNOSIS — R338 Other retention of urine: Secondary | ICD-10-CM | POA: Insufficient documentation

## 2021-12-08 DIAGNOSIS — N401 Enlarged prostate with lower urinary tract symptoms: Secondary | ICD-10-CM | POA: Insufficient documentation

## 2021-12-08 HISTORY — DX: Gastro-esophageal reflux disease without esophagitis: K21.9

## 2021-12-08 HISTORY — PX: HOLEP-LASER ENUCLEATION OF THE PROSTATE WITH MORCELLATION: SHX6641

## 2021-12-08 LAB — GLUCOSE, CAPILLARY
Glucose-Capillary: 244 mg/dL — ABNORMAL HIGH (ref 70–99)
Glucose-Capillary: 239 mg/dL — ABNORMAL HIGH (ref 70–99)

## 2021-12-08 SURGERY — ENUCLEATION, PROSTATE, USING LASER, WITH MORCELLATION
Anesthesia: General

## 2021-12-08 MED ORDER — ONDANSETRON HCL 4 MG/2ML IJ SOLN
INTRAMUSCULAR | Status: AC
  Filled 2021-12-08: qty 2

## 2021-12-08 MED ORDER — CEFAZOLIN SODIUM-DEXTROSE 2-4 GM/100ML-% IV SOLN
INTRAVENOUS | Status: AC
  Filled 2021-12-08: qty 100

## 2021-12-08 MED ORDER — OXYCODONE HCL 5 MG/5ML PO SOLN: 5.0000 mg | Freq: Once | ORAL | Status: AC | PRN

## 2021-12-08 MED ORDER — PROPOFOL 10 MG/ML IV BOLUS
INTRAVENOUS | Status: AC
  Filled 2021-12-08: qty 20

## 2021-12-08 MED ORDER — PROPOFOL 10 MG/ML IV BOLUS
INTRAVENOUS | Status: DC | PRN
  Administered 2021-12-08: 200 mg via INTRAVENOUS

## 2021-12-08 MED ORDER — PROMETHAZINE HCL 25 MG/ML IJ SOLN: 6.2500 mg | INTRAMUSCULAR | Status: DC | PRN

## 2021-12-08 MED ORDER — PHENYLEPHRINE HCL (PRESSORS) 10 MG/ML IV SOLN
INTRAVENOUS | Status: DC | PRN
Start: 2021-12-08 — End: 2021-12-08
  Administered 2021-12-08 (×3): 100 ug via INTRAVENOUS
  Administered 2021-12-08: 200 ug via INTRAVENOUS

## 2021-12-08 MED ORDER — ACETAMINOPHEN 10 MG/ML IV SOLN
INTRAVENOUS | Status: DC | PRN
  Administered 2021-12-08: 1000 mg via INTRAVENOUS

## 2021-12-08 MED ORDER — SODIUM CHLORIDE 0.9 % IR SOLN
Status: DC | PRN
  Administered 2021-12-08: 9000 mL via INTRAVESICAL
  Administered 2021-12-08: 12000 mL via INTRAVESICAL
  Administered 2021-12-08: 3000 mL

## 2021-12-08 MED ORDER — FENTANYL CITRATE (PF) 100 MCG/2ML IJ SOLN
INTRAMUSCULAR | Status: DC | PRN
  Administered 2021-12-08 (×2): 50 ug via INTRAVENOUS

## 2021-12-08 MED ORDER — ACETAMINOPHEN 10 MG/ML IV SOLN: 1000.0000 mg | Freq: Once | INTRAVENOUS | Status: DC | PRN

## 2021-12-08 MED ORDER — OXYBUTYNIN CHLORIDE 5 MG PO TABS: 5.0000 mg | ORAL_TABLET | Freq: Three times a day (TID) | ORAL | 0 refills | Status: DC | PRN

## 2021-12-08 MED ORDER — LIDOCAINE HCL (PF) 2 % IJ SOLN
INTRAMUSCULAR | Status: AC
  Filled 2021-12-08: qty 5

## 2021-12-08 MED ORDER — FENTANYL CITRATE (PF) 100 MCG/2ML IJ SOLN
INTRAMUSCULAR | Status: AC
  Filled 2021-12-08: qty 2

## 2021-12-08 MED ORDER — FUROSEMIDE 10 MG/ML IJ SOLN
INTRAMUSCULAR | Status: DC | PRN
  Administered 2021-12-08: 10 mg via INTRAMUSCULAR

## 2021-12-08 MED ORDER — SUGAMMADEX SODIUM 200 MG/2ML IV SOLN
INTRAVENOUS | Status: DC | PRN
  Administered 2021-12-08: 200 mg via INTRAVENOUS

## 2021-12-08 MED ORDER — HYDROCODONE-ACETAMINOPHEN 5-325 MG PO TABS
1.0000 | ORAL_TABLET | Freq: Four times a day (QID) | ORAL | 0 refills | Status: DC | PRN
Start: 2021-12-08 — End: 2022-01-12

## 2021-12-08 MED ORDER — OXYCODONE HCL 5 MG PO TABS
5.0000 mg | ORAL_TABLET | Freq: Once | ORAL | Status: AC | PRN
  Administered 2021-12-08: 5 mg via ORAL

## 2021-12-08 MED ORDER — PHENYLEPHRINE HCL-NACL 20-0.9 MG/250ML-% IV SOLN
INTRAVENOUS | Status: DC | PRN
  Administered 2021-12-08: 25 ug/min via INTRAVENOUS

## 2021-12-08 MED ORDER — OXYCODONE HCL 5 MG PO TABS
ORAL_TABLET | ORAL | Status: AC
  Filled 2021-12-08: qty 1

## 2021-12-08 MED ORDER — ONDANSETRON HCL 4 MG/2ML IJ SOLN
INTRAMUSCULAR | Status: DC | PRN
  Administered 2021-12-08: 4 mg via INTRAVENOUS

## 2021-12-08 MED ORDER — ROCURONIUM BROMIDE 100 MG/10ML IV SOLN
INTRAVENOUS | Status: DC | PRN
Start: 2021-12-08 — End: 2021-12-08
  Administered 2021-12-08: 40 mg via INTRAVENOUS
  Administered 2021-12-08 (×2): 10 mg via INTRAVENOUS

## 2021-12-08 MED ORDER — FENTANYL CITRATE (PF) 100 MCG/2ML IJ SOLN: 25.0000 ug | INTRAMUSCULAR | Status: DC | PRN

## 2021-12-08 MED ORDER — ROCURONIUM BROMIDE 10 MG/ML (PF) SYRINGE
PREFILLED_SYRINGE | INTRAVENOUS | Status: AC
  Filled 2021-12-08: qty 10

## 2021-12-08 MED ORDER — ACETAMINOPHEN 10 MG/ML IV SOLN
INTRAVENOUS | Status: AC
  Filled 2021-12-08: qty 100

## 2021-12-08 MED ORDER — LIDOCAINE HCL (CARDIAC) PF 100 MG/5ML IV SOSY
PREFILLED_SYRINGE | INTRAVENOUS | Status: DC | PRN
  Administered 2021-12-08: 80 mg via INTRAVENOUS

## 2021-12-08 MED ORDER — CHLORHEXIDINE GLUCONATE 0.12 % MT SOLN
OROMUCOSAL | Status: AC
  Administered 2021-12-08: 15 mL via OROMUCOSAL
  Filled 2021-12-08: qty 15

## 2021-12-08 SURGICAL SUPPLY — 36 items
ADAPTER IRRIG TUBE 2 SPIKE SOL (ADAPTER) ×3 IMPLANT
ADPR TBG 2 SPK PMP STRL ASCP (ADAPTER) ×1
BAG DRN LRG CPC RND TRDRP CNTR (MISCELLANEOUS)
BAG DRN RND TRDRP ANRFLXCHMBR (UROLOGICAL SUPPLIES) ×1
BAG URINE DRAIN 2000ML AR STRL (UROLOGICAL SUPPLIES) ×1 IMPLANT
BAG URO DRAIN 4000ML (MISCELLANEOUS) IMPLANT
CATH FOL 2WAY LX 20X30 (CATHETERS) ×1 IMPLANT
CATH FOL 2WAY LX 22X30 (CATHETERS) IMPLANT
CATH FOLEY 3WAY 30CC 22FR (CATHETERS) IMPLANT
CATH URETL OPEN 5X70 (CATHETERS) ×2 IMPLANT
CONTAINER COLLECT MORCELLATR (MISCELLANEOUS) ×1 IMPLANT
DRAPE 3/4 80X56 (DRAPES) ×2 IMPLANT
DRAPE UTILITY 15X26 TOWEL STRL (DRAPES) IMPLANT
FIBER LASER FLEXIVA PULSE 550 (Laser) ×2 IMPLANT
FILTER OVERFLOW MORCELLATOR (FILTER) ×1 IMPLANT
GAUZE 4X4 16PLY ~~LOC~~+RFID DBL (SPONGE) ×4 IMPLANT
GLOVE SURG ENC MOIS LTX SZ6.5 (GLOVE) ×4 IMPLANT
GOWN STRL REUS W/ TWL LRG LVL3 (GOWN DISPOSABLE) ×2 IMPLANT
GOWN STRL REUS W/TWL LRG LVL3 (GOWN DISPOSABLE) ×4
HOLDER FOLEY CATH W/STRAP (MISCELLANEOUS) ×2 IMPLANT
IV NS IRRIG 3000ML ARTHROMATIC (IV SOLUTION) ×13 IMPLANT
KIT TURNOVER CYSTO (KITS) ×2 IMPLANT
MBRN O SEALING YLW 17 FOR INST (MISCELLANEOUS) ×2
MEMBRANE SLNG YLW 17 FOR INST (MISCELLANEOUS) ×1 IMPLANT
MORCELLATOR COLLECT CONTAINER (MISCELLANEOUS) ×2
MORCELLATOR OVERFLOW FILTER (FILTER) ×2
MORCELLATOR ROTATION 4.75 335 (MISCELLANEOUS) ×2 IMPLANT
PACK CYSTO AR (MISCELLANEOUS) ×2 IMPLANT
SET CYSTO W/LG BORE CLAMP LF (SET/KITS/TRAYS/PACK) IMPLANT
SET IRRIG Y TYPE TUR BLADDER L (SET/KITS/TRAYS/PACK) ×2 IMPLANT
SLEEVE PROTECTION STRL DISP (MISCELLANEOUS) ×4 IMPLANT
SYR TOOMEY IRRIG 70ML (MISCELLANEOUS) ×2
SYRINGE TOOMEY IRRIG 70ML (MISCELLANEOUS) ×1 IMPLANT
TUBE PUMP MORCELLATOR PIRANHA (TUBING) ×2 IMPLANT
WATER STERILE IRR 1000ML POUR (IV SOLUTION) ×2 IMPLANT
WATER STERILE IRR 500ML POUR (IV SOLUTION) ×2 IMPLANT

## 2021-12-08 NOTE — Transfer of Care (Signed)
Immediate Anesthesia Transfer of Care Note  Patient: Travis [name].  Procedure(s) Performed: HOLEP-LASER ENUCLEATION OF THE PROSTATE WITH MORCELLATION  Patient Location: PACU  Anesthesia Type:General  Level of Consciousness: awake, alert  and oriented  Airway & Oxygen Therapy: Patient Spontanous Breathing and Patient connected to nasal cannula oxygen  Post-op Assessment: Report given to RN and Post -op Vital signs reviewed and stable  Post vital signs: stable  Last Vitals:  Vitals Value Taken Time  BP 137/88 12/08/21 1400  Temp    Pulse 72 12/08/21 1401  Resp 15 12/08/21 1401  SpO2 99 % 12/08/21 1401  Vitals shown include unvalidated device data.  Last Pain:  Vitals:   12/08/21 1104  TempSrc: Tympanic  PainSc: 0-No pain      Patients Stated Pain Goal: 0 (57/50/51 [id number])  Complications: No notable events documented.

## 2021-12-08 NOTE — Anesthesia Procedure Notes (Signed)
Procedure Name: Intubation Date/Time: 12/08/2021 12:13 PM Performed by: Hedda Slade, CRNA Pre-anesthesia Checklist: Patient identified, Patient being monitored, Timeout performed, Emergency Drugs available and Suction available Patient Re-evaluated:Patient Re-evaluated prior to induction Oxygen Delivery Method: Circle system utilized Preoxygenation: Pre-oxygenation with 100% oxygen Induction Type: IV induction Ventilation: Mask ventilation without difficulty and Oral airway inserted - appropriate to patient size Laryngoscope Size: McGraph and 4 Grade View: Grade I Tube type: Oral Tube size: 7.0 mm Number of attempts: 1 Airway Equipment and Method: Stylet Placement Confirmation: ETT inserted through vocal cords under direct vision, positive ETCO2 and breath sounds checked- equal and bilateral Secured at: 22 cm Tube secured with: Tape Dental Injury: Teeth and Oropharynx as per pre-operative assessment

## 2021-12-08 NOTE — Anesthesia Preprocedure Evaluation (Addendum)
Anesthesia Evaluation  Patient identified by MRN, date of birth, ID band Patient awake    Reviewed: Allergy & Precautions, NPO status , Patient's Chart, lab work & pertinent test results  History of Anesthesia Complications Negative for: history of anesthetic complications  Airway Mallampati: II  TM Distance: >3 FB Neck ROM: Full    Dental no notable dental hx.    Pulmonary shortness of breath and with exertion, neg sleep apnea, neg COPD,  Recent pulmonary embolism. On anticoagulation. Follows with pulmonologist   breath sounds clear to auscultation- rhonchi (-) wheezing      Cardiovascular Exercise Tolerance: Good (-) hypertension+ DOE  (-) CAD, (-) Past MI, (-) Cardiac Stents and (-) CABG  Rhythm:Regular Rate:Normal - Systolic murmurs and - Diastolic murmurs    Neuro/Psych negative neurological ROS  negative psych ROS   GI/Hepatic Neg liver ROS, hiatal hernia, GERD  Controlled and Medicated,  Endo/Other  diabetes (A1c 10 in 09/2021), Poorly Controlled, Type 2, Oral Hypoglycemic Agents, Insulin Dependent  Renal/GU Renal disease   BPH    Musculoskeletal negative musculoskeletal ROS (+)   Abdominal Normal abdominal exam  (+) - obese,   Peds  Hematology CLL   Anesthesia Other Findings DM CLL Left ventricular ejection fraction, by estimation, is 60 to 65%   10/9 admitted for lethargy, DKA, metabolic encephalopathy, sepsis  Reproductive/Obstetrics                            Anesthesia Physical  Anesthesia Plan  ASA: 3  Anesthesia Plan: General   Post-op Pain Management:    Induction: Intravenous  PONV Risk Score and Plan: 2 and Ondansetron, Dexamethasone and Treatment may vary due to age or medical condition  Airway Management Planned: Oral ETT  Additional Equipment:   Intra-op Plan:   Post-operative Plan: Extubation in OR  Informed Consent: I have reviewed the patients  History and Physical, chart, labs and discussed the procedure including the risks, benefits and alternatives for the proposed anesthesia with the patient or authorized representative who has indicated his/her understanding and acceptance.     Dental advisory given  Plan Discussed with: CRNA and Anesthesiologist  Anesthesia Plan Comments:        Anesthesia Quick Evaluation

## 2021-12-08 NOTE — Anesthesia Postprocedure Evaluation (Signed)
Anesthesia Post Note  Patient: Travis [name].  Procedure(s) Performed: HOLEP-LASER ENUCLEATION OF THE PROSTATE WITH MORCELLATION  Patient location during evaluation: PACU Anesthesia Type: General Level of consciousness: awake and alert Pain management: pain level controlled Vital Signs Assessment: post-procedure vital signs reviewed and stable Respiratory status: spontaneous breathing, nonlabored ventilation and respiratory function stable Cardiovascular status: blood pressure returned to baseline and stable Postop Assessment: no apparent nausea or vomiting Anesthetic complications: no   No notable events documented.   Last Vitals:  Vitals:   12/08/21 1423 12/08/21 1441  BP: 131/84 (!) 146/81  Pulse: 67 73  Resp: 13 16  Temp: (!) 36.1 C 36.9 C  SpO2: 100% 97%    Last Pain:  Vitals:   12/08/21 1441  TempSrc: Temporal  PainSc: [place]

## 2021-12-08 NOTE — Op Note (Signed)
Date of procedure: 12/08/21  Preoperative diagnosis:  BPH with BOO  Postoperative diagnosis:  same   Procedure: HoLEP with morcellation  Surgeon: Hollice Espy, MD  Anesthesia: General  Complications: None  Intraoperative findings: bilobar coaptaion of prostate with elevated bladder neck  EBL: 100 cc  Specimens: prostate chips  Drains: 20 Fr 2 way foley with 60 cc in balloon  Indication: Travis [name]. is a 70 y.o. patient with BPH with obstruction/ chronic retention.  After reviewing the management options for treatment, he elected to proceed with the above surgical procedure(s). We have discussed the potential benefits and risks of the procedure, side effects of the proposed treatment, the likelihood of the patient achieving the goals of the procedure, and any potential problems that might occur during the procedure or recuperation. Informed consent has been obtained.  Description of procedure:  The patient was taken to the operating room and general anesthesia was induced.  The patient was placed in the dorsal lithotomy position, prepped and draped in the usual sterile fashion, and preoperative antibiotics were administered. A preoperative time-out was performed.     A 26 French resectoscope sheath using a blunt angled obturator was introduced without difficulty into the bladder.  The bladder was carefully inspected and noted to be moderately trabeculated.  There is an elevated bladder neck with a very small intravesical component.  The trigone was able to be visualized with some manipulation and the UOs were a good distance from the bladder neck itself.  The prostatic fossa had significant trilobar coaptation with greater than 5 cm prostatic length.  A 550 m laser fiber was then brought in and using settings of 1 J's and 50 Hz, an incision was created at the 6:00 positions of the bladder neck down to the level of the bladder neck/capsular fibers.  The incision was carried  down caudally just above the verumontanum.     Next, a semilunar incision was created at the prostatic apex on the left side again freeing up the adenoma from the underlying capsule.  Care was taken to avoid any resection past the verumontanum.  This incision was carried around laterally and cranially towards the bladder neck.  Ultimately, I was able to complete the anterior commissure mucosa and the adenoma into the bladder creating a widely patent prostatic fossa.     Next, the same similar incision was created at the right prostatic apex.  This adenoma however ended up being enucleated and [name] of a piece wise fashion freeing up a large BPH nodules from the capsular fibers.  Once this was completed and cleared from the bladder neck, the prostatic fossa was noted to be widely patent.  Hemostasis was achieved using hemostatic fiber settings.  Bilateral UOs were visualized and free of any injury.  Finally, the 54 French resectoscope was exchanged for nephroscope and using the Piranha handpiece morcellator, the bladder was distended in each of the prostate chips were evacuated.  The bladder was irrigated several times and smaller joules were clear for the bladder.  This point time, there were no residual fibers appreciated in the bladder.  Hemostasis was adequate.  10 mg of IV Lasix was administered to help with postoperative diuresis.  A 20 French two-way Foley catheter was then inserted over a catheter guide with 60 cc in the balloon.  The catheter irrigated easily and well.  Patient was then clean and dry, repositioned supine position, reversed from anesthesia, taken to PACU in stable condition.   Plan:  Patient will return to the office in 2 days for foley removal and then 6 weeks for IPSS/ PVR     Hollice Espy, M.D.

## 2021-12-08 NOTE — Interval H&P Note (Signed)
History and Physical Interval Note:  12/08/2021 11:51 AM  Onyx Veto Kemps.  has presented today for surgery, with the diagnosis of BPH with Urinary Obstruction.  The various methods of treatment have been discussed with the patient and family. After consideration of risks, benefits and other options for treatment, the patient has consented to  Procedure(s): [place] WITH MORCELLATION (N/A) as a surgical intervention.  The patient's history has been reviewed, patient examined, no change in status, stable for surgery.  I have reviewed the patient's chart and labs.  Questions were answered to the patient's satisfaction.    RRR CTAB   Hollice Espy

## 2021-12-08 NOTE — Discharge Instructions (Addendum)
We discussed alternatives including TURP vs. holmium laser enucleation of the prostate vs. greenlight laser ablation. Differences between the surgical procedures were discussed as well as the risks and benefits of each.  He is most interested in HoLEP.  We discussed the common postoperative course following holep including need for overnight Foley catheter, temporary worsening of irritative voiding symptoms, and occasional stress incontinence which typically lasts up to 6 months but can persist.  We discussed retrograde ejaculation and damage to surrounding structures including the urinary sphincter. Other uncommon complications including hematuria and urinary tract infection.  He understands all of the above and is willing to proceed as planned.   AMBULATORY SURGERY  DISCHARGE INSTRUCTIONS   The drugs that you were given will stay in your system until tomorrow so for the next 24 hours you should not:  Drive an automobile Make any legal decisions Drink any alcoholic beverage   You may resume regular meals tomorrow.  Today it is better to start with liquids and gradually work up to solid foods.  You may eat anything you prefer, but it is better to start with liquids, then soup and crackers, and gradually work up to solid foods.   Please notify your doctor immediately if you have any unusual bleeding, trouble breathing, redness and pain at the surgery site, drainage, fever, or pain not relieved by medication.    Additional Instructions: Please contact your physician with any problems or Same Day Surgery at [phone], Monday through Friday 6 am to 4 pm, or  at [facility] number at [phone].

## 2021-12-09 ENCOUNTER — Encounter: Payer: Self-pay | Admitting: Urology

## 2021-12-10 LAB — SURGICAL PATHOLOGY

## 2021-12-12 ENCOUNTER — Other Ambulatory Visit: Payer: Self-pay | Admitting: Pulmonary Disease

## 2021-12-12 DIAGNOSIS — R911 Solitary pulmonary nodule: Secondary | ICD-10-CM

## 2021-12-15 ENCOUNTER — Other Ambulatory Visit: Payer: Self-pay

## 2021-12-15 ENCOUNTER — Ambulatory Visit: Payer: BC Managed Care – PPO | Admitting: Physician Assistant

## 2021-12-15 ENCOUNTER — Ambulatory Visit (INDEPENDENT_AMBULATORY_CARE_PROVIDER_SITE_OTHER): Payer: BC Managed Care – PPO | Admitting: Physician Assistant

## 2021-12-15 DIAGNOSIS — N138 Other obstructive and reflux uropathy: Secondary | ICD-10-CM

## 2021-12-15 DIAGNOSIS — N401 Enlarged prostate with lower urinary tract symptoms: Secondary | ICD-10-CM

## 2021-12-15 LAB — BLADDER SCAN AMB NON-IMAGING

## 2021-12-15 NOTE — Progress Notes (Signed)
Catheter Removal  Patient is present today for a catheter removal.  58ml of water was drained from the balloon. A 20FR foley cath was removed from the bladder no complications were noted . Patient tolerated well.  Performed by: Debroah Loop, PA-C   Follow up/ Additional notes: RTC this afternoon for PVR.  Afternoon follow-up  Patient returned to clinic this afternoon for repeat PVR. He reports drinking approximately 36oz of fluid. He has been able to urinate but notes some intermittency. He has not had urinary leakage and is already doing Kegel exercises. PVR 170mL.  Results for orders placed or performed in visit on 12/15/21  Bladder Scan (Post Void Residual) in office  Result Value Ref Range   Scan Result 143mL    Voiding trial passed. Counseled patient on normal postoperative findings including dysuria, gross hematuria, and urinary urgency/leakage. Counseled him to continue Kegel exercises 3x10 sets daily as needed to increase urinary control; verbal and written instructions provided today. Shared negative surgical pathology results with patient; he expressed understanding.  Follow up: 6 week postop follow-up with Dr. Erlene Quan with PVR, IPSS

## 2021-12-15 NOTE — Patient Instructions (Signed)

## 2021-12-16 ENCOUNTER — Ambulatory Visit
Admission: RE | Admit: 2021-12-16 | Discharge: 2021-12-16 | Disposition: A | Discharge status: Home / Self Care | Payer: BC Managed Care – PPO | Source: Ambulatory Visit | Attending: Pulmonary Disease | Admitting: Pulmonary Disease

## 2021-12-16 DIAGNOSIS — R911 Solitary pulmonary nodule: Secondary | ICD-10-CM | POA: Diagnosis present

## 2021-12-16 DIAGNOSIS — C9110 Chronic lymphocytic leukemia of B-cell type not having achieved remission: Secondary | ICD-10-CM | POA: Insufficient documentation

## 2021-12-16 IMAGING — US US BIOPSY LYMPH NODE
1 series · 13 of 17 positions shown · non-contrast
Comparison: none

INDICATION: History of CLL and enlarged right axillary lymph node.

[Series 1: us biopsy lymph node · 0.06mm/px · 13 of 17 slices shown]
[im 1/17]
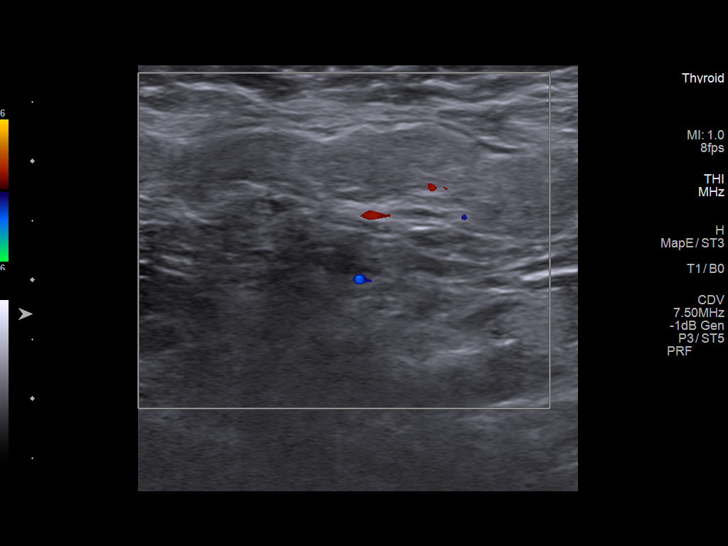
[im 2/17]
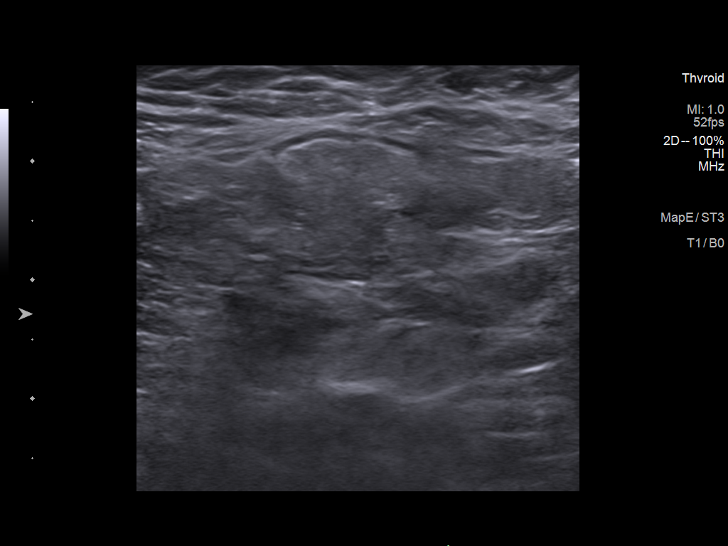
[im 4/17]
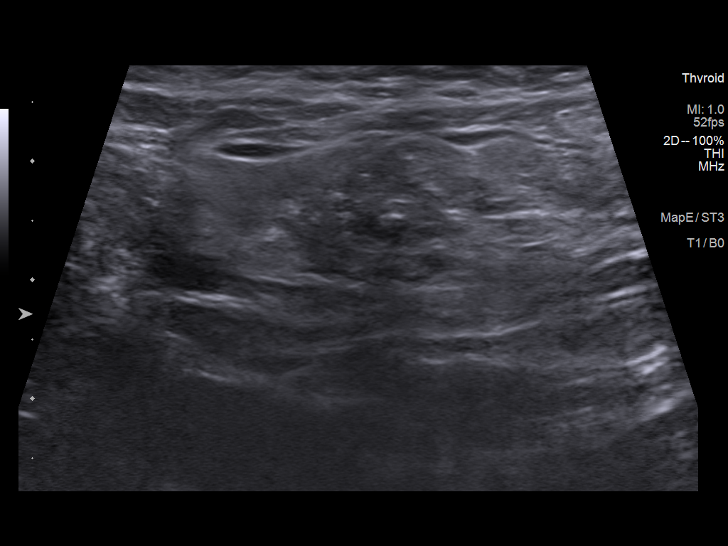
[im 5/17]
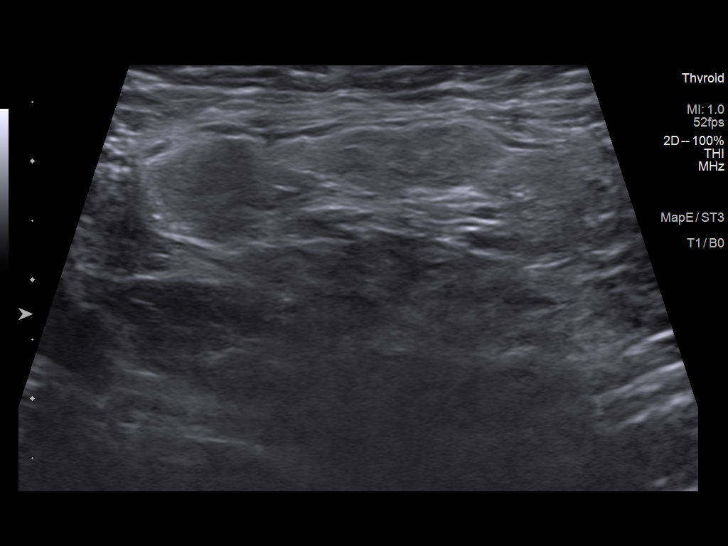
[im 6/17]
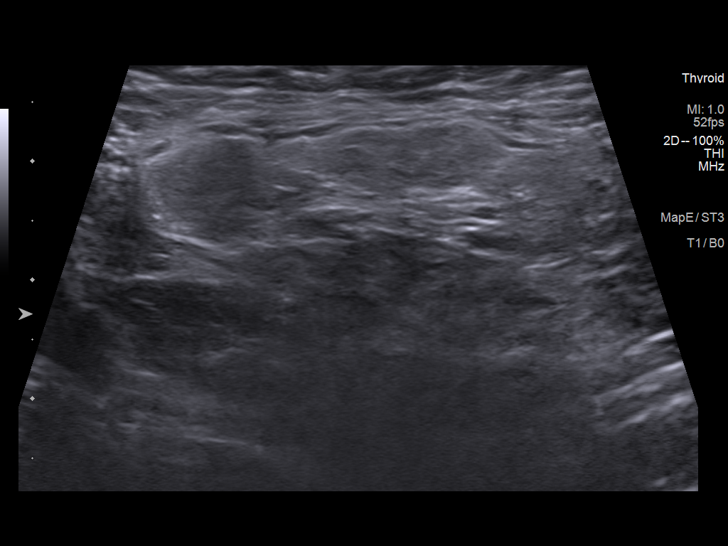
[im 8/17]
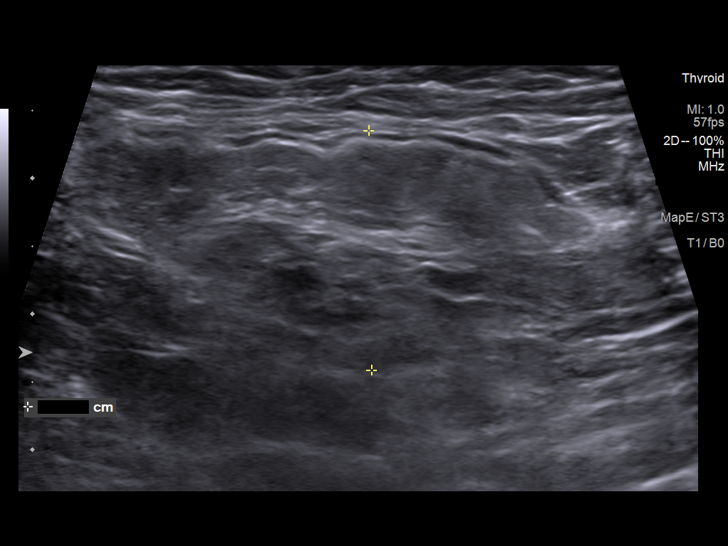
[im 9/17]
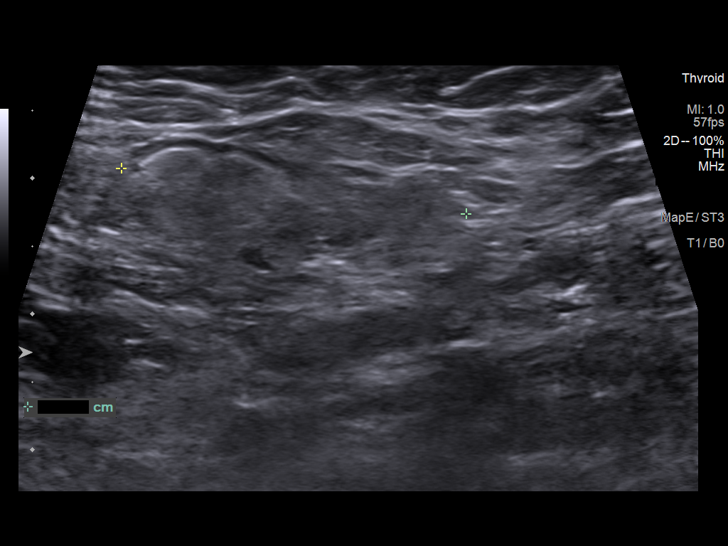
[im 10/17]
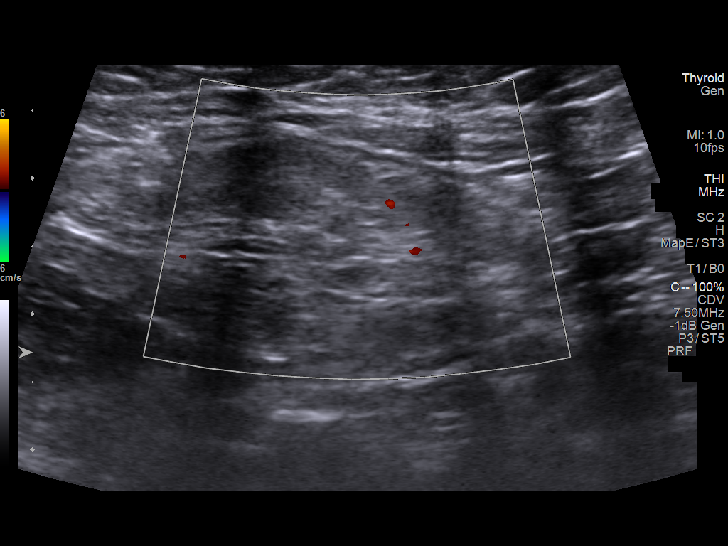
[im 12/17]
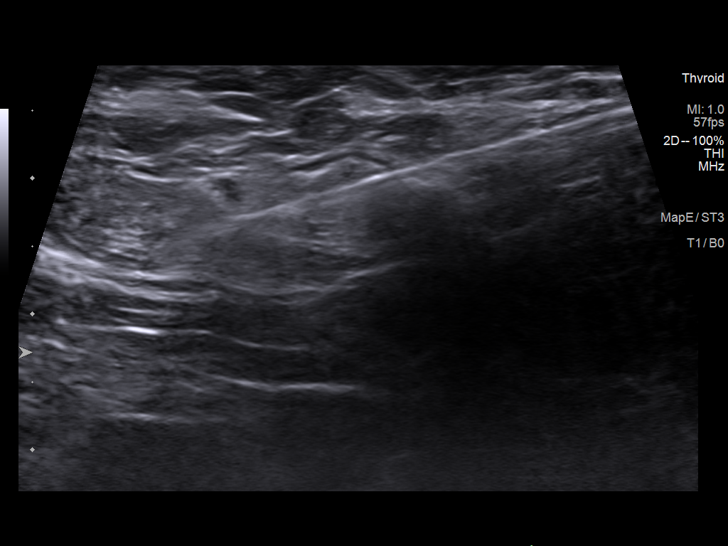
[im 13/17]
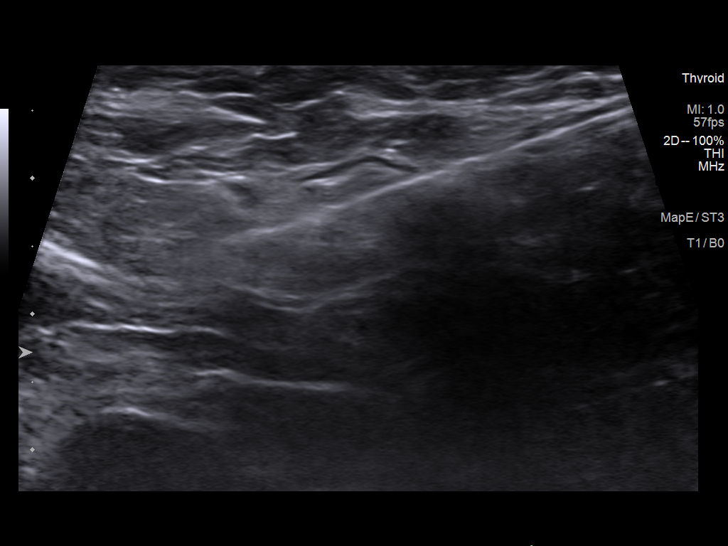
[im 14/17]
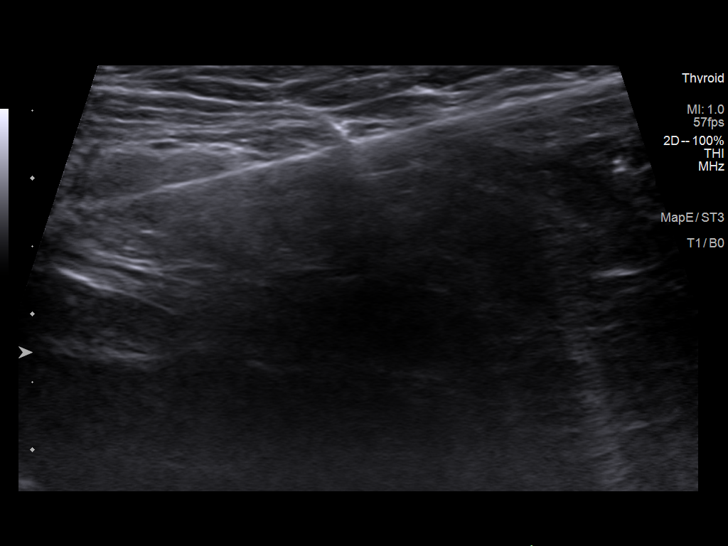
[im 16/17]
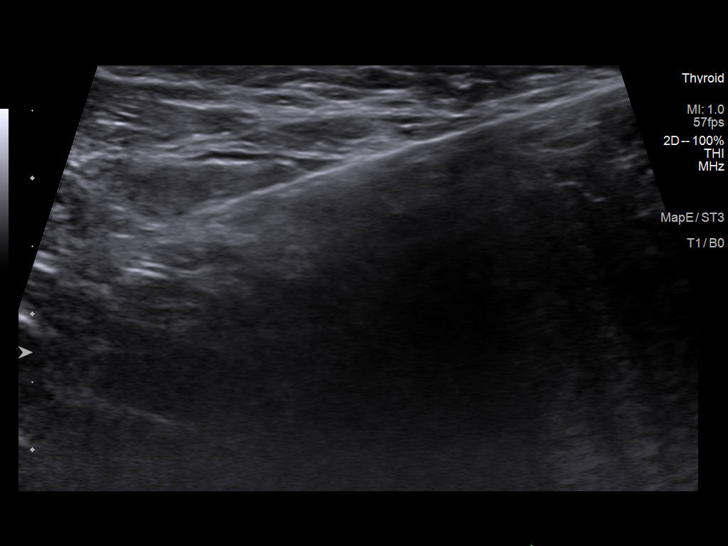
[im 17/17]
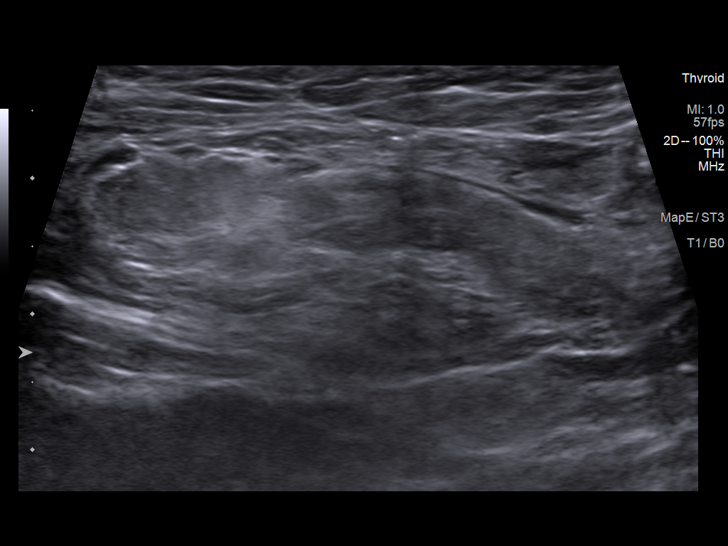

[13 of 17 positions shown; findings below may reference images not displayed]

EXAM:
ULTRASOUND GUIDED CORE BIOPSY OF RIGHT AXILLARY LYMPH NODE

MEDICATIONS:
None.

ANESTHESIA/SEDATION:
None

PROCEDURE:
The procedure, risks, benefits, and alternatives were explained to
the patient. Questions regarding the procedure were encouraged and
answered. The patient understands and consents to the procedure. A
time-out was performed prior to initiating the procedure.

The right axilla was prepped with chlorhexidine in a sterile
fashion, and a sterile drape was applied covering the operative
field. A sterile gown and sterile gloves were used for the
procedure. Local anesthesia was provided with 1% Lidocaine.

A 16 gauge core biopsy device was utilized in obtaining 5 different
samples at the level of an enlarged right axillary lymph node. Core
biopsy samples were submitted on saline soaked Telfa gauze.
Additional ultrasound was performed.

COMPLICATIONS:
None immediate.
FINDINGS: [HOSPITAL] the level of the right axilla demonstrates in ill-defined
enlarged lymph node measuring roughly 1.8 x 3.9 x 2.6 cm and
corresponding to the lymph node seen by recent PET scan and CT of
the chest. Solid tissue was obtained.
IMPRESSION: Ultrasound-guided core biopsy performed of an enlarged right
axillary lymph node.

## 2021-12-16 NOTE — Procedures (Signed)
Interventional Radiology Procedure Note  Procedure: US Guided Biopsy of right axillary lymph node  Complications: None  Estimated Blood Loss: < 10 mL  Findings: 21 G core biopsy of right axillary LN performed under US guidance.  Five core samples obtained and sent to Pathology.  Venetia Night. Kathlene Cote, M.D Pager:  [phone]

## 2021-12-17 LAB — SURGICAL PATHOLOGY

## 2021-12-18 ENCOUNTER — Encounter: Payer: Self-pay | Admitting: Oncology

## 2021-12-22 NOTE — Progress Notes (Signed)
[place]  [phone] [phone]  ID: [name]. 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  INTERVAL HISTORY: Patient returns to clinic today for further evaluation and discussion of his PET scan and axillary lymph node biopsy results.  He currently feels well and is asymptomatic.  He does not complain of weakness or fatigue today.  He has no new neurologic complaints.  He denies any fevers.  He has a good appetite and denies weight loss.  He denies any chest pain, shortness of breath, cough, or hemoptysis.  He denies any nausea, vomiting, constipation, or diarrhea. He has no urinary complaints.  Patient offers no specific complaints today.  REVIEW OF SYSTEMS:   Review of Systems  Constitutional: Negative.  Negative for diaphoresis, fever, malaise/fatigue and weight loss.  Respiratory: Negative.  Negative for cough and shortness of breath.   Cardiovascular: Negative.  Negative for chest pain and leg swelling.  Gastrointestinal:  Negative for abdominal pain, blood in stool, melena, nausea and vomiting.  Genitourinary: Negative.  Negative for dysuria.  Musculoskeletal: Negative.  Negative for back pain.  Skin: Negative.  Negative for rash.  Neurological:  Positive for focal weakness. Negative for dizziness, tingling, sensory change, weakness and headaches.  Psychiatric/Behavioral: Negative.  Negative for depression. The patient is not nervous/anxious and does not have insomnia.    As per HPI. Otherwise, a complete review of systems is negative.  PAST MEDICAL HISTORY: Past Medical History:  Diagnosis Date   Anxiety    Depression    Diabetes mellitus without complication (HCC)     GERD (gastroesophageal reflux disease)    Leukemia (HCC)    chronic lymphocytic   Pneumonia     PAST SURGICAL HISTORY: Past Surgical History:  Procedure Laterality Date   COLONOSCOPY     ESOPHAGOGASTRODUODENOSCOPY (EGD) WITH PROPOFOL N/A 03/14/2018   Procedure: ESOPHAGOGASTRODUODENOSCOPY (EGD) WITH PROPOFOL;  Surgeon: [name], MD;  Location: [facility] ENDOSCOPY;  Service: Gastroenterology;  Laterality: N/A;   ESOPHAGOGASTRODUODENOSCOPY (EGD) WITH PROPOFOL N/A 06/13/2018   Procedure: ESOPHAGOGASTRODUODENOSCOPY (EGD) WITH PROPOFOL;  Surgeon: [name], MD;  Location: [facility] ENDOSCOPY;  Service: Gastroenterology;  Laterality: N/A;   ESOPHAGOGASTRODUODENOSCOPY (EGD) WITH PROPOFOL N/A 10/09/2021   Procedure: ESOPHAGOGASTRODUODENOSCOPY (EGD) WITH PROPOFOL;  Surgeon: Virgel Manifold, MD;  Location: [facility] ENDOSCOPY;  Service: Endoscopy;  Laterality: N/A;   HOLEP-LASER ENUCLEATION OF THE PROSTATE WITH MORCELLATION N/A 12/08/2021   Procedure: HOLEP-LASER ENUCLEATION OF THE PROSTATE WITH MORCELLATION;  Surgeon: Hollice Espy, MD;  Location: [facility] ORS;  Service: Urology;  Laterality: N/A;   TEE WITHOUT CARDIOVERSION N/A 10/09/2021   Procedure: TRANSESOPHAGEAL ECHOCARDIOGRAM (TEE);  Surgeon: Virgel Manifold, MD;  Location[facility] ENDOSCOPY;  Service: Endoscopy;  Laterality: N/A;   WISDOM TOOTH EXTRACTION      FAMILY HISTORY: Family History  Problem Relation Age of Onset   Leukemia Mother    Colon cancer Father    Diabetes Father    Heart disease Father    Diabetes Brother     ADVANCED DIRECTIVES (Y/N):  N  HEALTH MAINTENANCE: Social History   Tobacco Use   Smoking status: Never   Smokeless tobacco: Never  Vaping Use   Vaping Use: Never used  Substance Use Topics   Alcohol use: Yes  Comment: not at all  ° Drug use: No  ° ° ° Colonoscopy: ° PAP: ° Bone density: ° Lipid panel: ° °No Known Allergies ° °Current Outpatient Medications  °Medication Sig Dispense Refill  °  acetaminophen (TYLENOL) 500 MG tablet Take 500-1,000 mg by mouth every 6 (six) hours as needed for mild pain.    ° Blood Glucose Monitoring Suppl (ONETOUCH VERIO FLEX SYSTEM) w/Device KIT See admin instructions.    ° cholecalciferol (VITAMIN D3) 25 MCG (1000 UNIT) tablet Take 1,000 Units by mouth daily.    ° citalopram (CELEXA) 20 MG tablet Take 40 mg by mouth daily.    ° ELIQUIS 5 MG TABS tablet Take 5 mg by mouth daily.    ° insulin detemir (LEVEMIR) 100 UNIT/ML injection Inject 15 Units into the skin 2 (two) times daily.    ° Insulin Pen Needle 31G X 5 MM MISC Use as directed 60 each 2  ° Lancets (FREESTYLE) lancets Use as instructed 100 each 2  ° metFORMIN (GLUCOPHAGE) 1000 MG tablet Take 1,000 mg by mouth 2 (two) times daily with a meal.    ° Multiple Vitamin (MULTIVITAMIN WITH MINERALS) TABS tablet Take 1 tablet by mouth daily.    ° ONETOUCH VERIO test strip 2 (two) times daily. use for testing    ° pantoprazole (PROTONIX) 40 MG tablet Take 1 tablet 2 times daily for 30 days, then 1 tablet once daily thereafter (Patient taking differently: Take 40 mg by mouth 2 (two) times daily.) 120 tablet 0  ° HYDROcodone-acetaminophen (NORCO/VICODIN) 5-325 MG tablet Take 1-2 tablets by mouth every 6 (six) hours as needed for moderate pain. (Patient not taking: Reported on 12/23/2021) 10 tablet 0  ° ibrutinib (IMBRUVICA) 420 MG TABS TAKE 1 TABLET BY MOUTH DAILY. (Patient not taking: Reported on 11/28/2021) 28 tablet 5  ° °No current facility-administered medications for this visit.  ° ° °OBJECTIVE: °Vitals:  ° 12/23/21 1009  °BP: 121/82  °Pulse: 82  °Resp: 16  °Temp: (!) 96.6 °F (35.9 °C)  °SpO2: 100%  °   Body mass index is 27.1 kg/m².    ECOG FS:1 - Symptomatic but completely ambulatory ° °General: Well-developed, well-nourished, no acute distress. °Eyes: Pink conjunctiva, anicteric sclera. °HEENT: Normocephalic, moist mucous membranes. °Lungs: No audible wheezing or coughing. °Heart: Regular rate and rhythm. °Abdomen:  Soft, nontender, no obvious distention. °Musculoskeletal: No edema, cyanosis, or clubbing. °Neuro: Alert, answering all questions appropriately. Cranial nerves grossly intact. °Skin: No rashes or petechiae noted. °Psych: Normal affect. ° ° °LAB RESULTS: ° °Lab Results  °Component Value Date  ° NA 138 10/30/2021  ° K 4.7 10/30/2021  ° CL 102 10/30/2021  ° CO2 29 10/30/2021  ° GLUCOSE 192 (H) 10/30/2021  ° BUN 21 10/30/2021  ° CREATININE 1.00 10/30/2021  ° CALCIUM 8.6 (L) 10/30/2021  ° PROT 6.0 (L) 10/30/2021  ° ALBUMIN 3.2 (L) 10/30/2021  ° AST 12 (L) 10/30/2021  ° ALT 13 10/30/2021  ° ALKPHOS 71 10/30/2021  ° BILITOT 0.7 10/30/2021  ° GFRNONAA >60 10/30/2021  ° GFRAA >60 07/05/2020  ° ° °Lab Results  °Component Value Date  ° WBC 6.4 10/30/2021  ° NEUTROABS 27.5 (H) 10/05/2021  ° HGB 12.5 (L) 10/30/2021  ° HCT 38.8 (L) 10/30/2021  ° MCV 86.2 10/30/2021  ° PLT 224 10/30/2021  ° ° ° °STUDIES: °NM PET Image Initial (PI) Skull Base To Thigh ° °Result Date: 12/04/2021 °CLINICAL DATA:  Initial treatment strategy for leukemia, right lower lobe nodule. EXAM: NUCLEAR   MEDICINE PET SKULL BASE TO THIGH TECHNIQUE: 10.4 mCi F-18 FDG was injected intravenously. Full-ring PET imaging was performed from the skull base to thigh after the radiotracer. CT data was obtained and used for attenuation correction and anatomic localization. Fasting blood glucose: 156 mg/dl COMPARISON:  CT chest 11/14/2021 FINDINGS: Mediastinal blood pool activity: SUV max 2.6 Liver activity: SUV max 3.5 NECK: Small lymph nodes in the neck are not substantially hypermetabolic. A left level III lymph node measuring 0.6 cm in short axis on image 65 of series 3 has maximum SUV of 1.5, De[place] 2. Incidental CT findings: none CHEST: Somewhat indistinctly marginated right axillary lymph node 1.2 cm in short axis on image 95 series 3 (formerly measured at 1.6 cm), maximum SUV 1.8, De[place]ville 2. The subcarinal node mentioned on the prior exam currently measures 0.8 cm  in short axis on image 114 series 3 and has a maximum SUV of 1.7, [place]ville 2. Bilateral pulmonary nodules are generally reduced in size from prior. Bilobed right lower lobe nodule 1.8 by 1.2 cm on image 114 series 3 with maximum SUV 1.3 (Deauville 2, formerly 2.2 by 1.5 cm on 11/14/2021. A right upper lobe ill-defined pulmonary nodule measuring 0.9 by 0.9 cm on image 92 series 3 has maximum SUV of 1.0 (Deauville 2) and previously measured 1.0 by 1.1 cm with previous internal cavitation which is no longer present. The other various pulmonary nodules in general appear smaller and have less sharply defined margins. None of these are substantially more hypermetabolic than the index nodules measured above. Incidental CT findings: The trace left pleural effusion has resolved. Coronary, aortic arch, and branch vessel atherosclerotic vascular disease. Ascending thoracic aortic aneurysm 4.9 cm in diameter as measured on prior exam. Upper normal heart size. Small type 1 hiatal hernia. ABDOMEN/PELVIS: Activity in the stomach antrum without CT correlate, maximum SUV 5.0, Deauville 4. 0.8 cm in short axis left common iliac node on image 220 series 3 with maximum SUV 1.1, De[place]ville 2. Left external iliac node 1.1 cm in short axis on image 265 series 3 with maximum SUV 1.3, Deauville 2. The spleen measures 12.2 by 6.3 by 15.0 cm (volume = 600 cm^3), compatible with mild splenomegaly, but no focal abnormal splenic activity is identified. There is abnormal focal wall thickening in the sigmoid colon associated with stranding in the mesentery and what appears to be an inflamed diverticulum on image 208 of series 3, compatible with mild to moderate active diverticulitis. Maximum SUV in this vicinity is 6.2, probably inflammatory, although correlation with the patient's colon cancer screening history is recommended. Incidental CT findings: Atherosclerosis is present, including aortoiliac atherosclerotic disease. Prostatomegaly.  SKELETON: No significant abnormal hypermetabolic activity in this region. Incidental CT findings: Thoracic spondylosis. IMPRESSION: 1. The lung nodules have reduced in size compared to 11/14/2021, and generally have mildly reduced conspicuity of margins and low (Deauville 2) activity. One of the nodules was previously cavitary but no longer. Possibilities include infectious/inflammatory nodules or malignant involvement with response to interval therapy. 2. The right axillary lymph node has reduced in size, currently 1.2 cm in short axis with Deauville 2 activity. 3. There is mild splenomegaly but no focal abnormal splenic lesion identified. Be physiologic. 4. Suspected sigmoid colon diverticulitis with inflamed diverticulum, stranding in the mesentery, and maximum SUV in the region of 6.2. This is likely all inflammatory. Correlation with the patient's colon cancer screening history is recommended. If screening is not up-to-date, appropriate screening should be considered following resolution of the patient's  acute process. 5. Accentuated activity in the stomach antrum without CT correlate, Deauville 4. This is nonspecific but could well be physiologic. 6. Mildly enlarged left external iliac lymph node 1.1 cm in short axis, Deauville 2. 7. 4.9 cm ascending thoracic aortic aneurysm. Ascending thoracic aortic aneurysm. Recommend semi-annual imaging followup by CTA or MRA and referral to cardiothoracic surgery if not already obtained. This recommendation follows 2010 ACCF/AHA/AATS/ACR/ASA/SCA/SCAI/SIR/STS/SVM Guidelines for the Diagnosis and Management of Patients With Thoracic Aortic Disease. Circulation. 2010; 121: O130-Q657. Aortic aneurysm NOS (ICD10-I71.9) 8. Other imaging findings of potential clinical significance: Interval resolution of prior trace left pleural effusion. Aortic Atherosclerosis (ICD10-I70.0). Coronary atherosclerosis. Systemic atherosclerosis. Prostatomegaly. Small type 1 hiatal hernia.  Electronically Signed   By: Van Clines M.D.   On: 12/04/2021 12:36   [place] CORE BIOPSY (LYMPH NODES)  Result Date: 12/16/2021 INDICATION: History of CLL and enlarged right axillary lymph node. EXAM: ULTRASOUND GUIDED CORE BIOPSY OF RIGHT AXILLARY LYMPH NODE MEDICATIONS: None. ANESTHESIA/SEDATION: None PROCEDURE: The procedure, risks, benefits, and alternatives were explained to the patient. Questions regarding the procedure were encouraged and answered. The patient understands and consents to the procedure. A time-out was performed prior to initiating the procedure. The right axilla was prepped with chlorhexidine in a sterile fashion, and a sterile drape was applied covering the operative field. A sterile gown and sterile gloves were used for the procedure. Local anesthesia was provided with 1% Lidocaine. A 16 gauge core biopsy device was utilized in obtaining 5 different samples at the level of an enlarged right axillary lymph node. Core biopsy samples were submitted on saline soaked Telfa gauze. Additional ultrasound was performed. COMPLICATIONS: None immediate. FINDINGS: Imaging at the level of the right axilla demonstrates in ill-defined enlarged lymph node measuring roughly 1.8 x 3.9 x 2.6 cm and corresponding to the lymph node seen by recent PET scan and CT of the chest. Solid tissue was obtained. IMPRESSION: Ultrasound-guided core biopsy performed of an enlarged right axillary lymph node. Electronically Signed   By: Aletta Edouard M.D.   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  PLAN:    Right lower lobe lung mass: CT scan results from November 14, 2021 reviewed independently and reported as above with a 2.2 right lower lobe spiculated mass highly suspicious for underlying malignancy.  MRI of the brain on October 06, 2021 was negative for any significant pathology.  Despite this, PET scan results reviewed independently and report as above with decreasing  size of spiculated mass and minimal SUV.  This is likely not malignancy, but we will continue active surveillance and repeat imaging in 3 months.   CLL: Right axillary lymph node biopsy confirmed persistent disease although patient continues to remain asymptomatic.  His white blood cell count continues to be within normal limits.  PET scan results from December 03, 2021 reviewed independently and report as above minimal hypermetabolic activity in lymph nodes.  Spleen has also reduced in size since 2017 where is reported 20 cm now only 15 cm.  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 Patient initiated Dolores Lory in approximately November 2017 with significant improvement of his B symptoms including his unusual neuropathy symptoms.  He discontinued Imbruvica in November 2022.  Will not reinitiate treatment at this time.  If patient had recurrence of disease either via low enlarging lymph nodes or increasing white blood cell count, would reinitiate Imbruvica at that time.  Return to clinic  in 3 months as above with repeat imaging and further evaluation.   °Neuropathy, foot drop: Chronic and unchanged.  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  Patient was previously given a referral to Occupational Therapy to help improve balance. °Difficulty swallowing: Patient does not complain of this today.  Appreciate GI input.  Patient had esophageal dilation on June 13, 2018. °Hyperglycemia: Patient has improved blood glucose control. Historically, patient's blood glucose is in 300 range.  Continue follow-up and treatment per primary care. °Thrombocytopenia: Patient's platelet count has been within the normal range since November 2021. °MRSA bacteremia: Resolved. °Bladder outlet obstruction secondary to BPH: Continue follow-up with urology as scheduled. ° ° °Patient expressed understanding and was in agreement with this plan. He also understands that He can call  clinic at any time with any questions, concerns, or complaints.  ° ° °Timothy J Finnegan, MD   12/23/2021 11:13 AM ° ° ° ° °

## 2021-12-23 ENCOUNTER — Inpatient Hospital Stay: Payer: BC Managed Care – PPO | Attending: Oncology | Admitting: Oncology

## 2021-12-23 ENCOUNTER — Encounter: Payer: Self-pay | Admitting: Oncology

## 2021-12-23 ENCOUNTER — Other Ambulatory Visit: Payer: Self-pay

## 2021-12-23 VITALS — BP 121/82 | HR 82 | Temp 96.6°F | Resp 16 | Ht 71.0 in | Wt 194.3 lb

## 2021-12-23 DIAGNOSIS — Z806 Family history of leukemia: Secondary | ICD-10-CM | POA: Diagnosis not present

## 2021-12-23 DIAGNOSIS — N401 Enlarged prostate with lower urinary tract symptoms: Secondary | ICD-10-CM | POA: Diagnosis not present

## 2021-12-23 DIAGNOSIS — M21379 Foot drop, unspecified foot: Secondary | ICD-10-CM | POA: Insufficient documentation

## 2021-12-23 DIAGNOSIS — Z800 Family history of malignant neoplasm of digestive organs: Secondary | ICD-10-CM | POA: Diagnosis not present

## 2021-12-23 DIAGNOSIS — R918 Other nonspecific abnormal finding of lung field: Secondary | ICD-10-CM | POA: Insufficient documentation

## 2021-12-23 DIAGNOSIS — D696 Thrombocytopenia, unspecified: Secondary | ICD-10-CM | POA: Insufficient documentation

## 2021-12-23 DIAGNOSIS — N138 Other obstructive and reflux uropathy: Secondary | ICD-10-CM | POA: Diagnosis not present

## 2021-12-23 DIAGNOSIS — R1310 Dysphagia, unspecified: Secondary | ICD-10-CM | POA: Insufficient documentation

## 2021-12-23 DIAGNOSIS — E1165 Type 2 diabetes mellitus with hyperglycemia: Secondary | ICD-10-CM | POA: Insufficient documentation

## 2021-12-23 DIAGNOSIS — E1140 Type 2 diabetes mellitus with diabetic neuropathy, unspecified: Secondary | ICD-10-CM | POA: Diagnosis not present

## 2021-12-23 DIAGNOSIS — C9110 Chronic lymphocytic leukemia of B-cell type not having achieved remission: Secondary | ICD-10-CM | POA: Diagnosis not present

## 2021-12-24 ENCOUNTER — Ambulatory Visit: Payer: BC Managed Care – PPO | Admitting: Physician Assistant

## 2022-01-12 ENCOUNTER — Other Ambulatory Visit: Payer: Self-pay

## 2022-01-12 ENCOUNTER — Encounter: Payer: Self-pay | Admitting: Gastroenterology

## 2022-01-12 ENCOUNTER — Ambulatory Visit: Payer: BC Managed Care – PPO | Admitting: Gastroenterology

## 2022-01-12 VITALS — BP 135/82 | HR 90 | Temp 98.3°F | Wt 197.2 lb

## 2022-01-12 DIAGNOSIS — K2100 Gastro-esophageal reflux disease with esophagitis, without bleeding: Secondary | ICD-10-CM

## 2022-01-12 NOTE — Progress Notes (Signed)
[name] MD, MRCP(U.K) [address]  [place]  [place], [place] [address]  Main: [phone]  Fax: [phone]   Primary Care Physician: Adin Hector, MD  Primary Gastroenterologist:  Dr. [name]  C/c : Dysphagia and gastric nodule   HPI: Travis [name]. is a 71 y.o. male   Summary of history :  Seen on 11/17/2021 by Dr Bonna Gains for heartburn which responded to PPI. Diagnosed with Acute PE, mass in lung and cavitating lesion concern for metastatic cancer. B . Had dysphagia that resolved with PPI  Interval history 11/17/2021-01/12/2022  10/09/2021: EGD:  LA Grade B reflux esophagitis with no bleeding. - Benign-appearing esophageal stenosis. - Erythematous, eroded and nodular mucosa in the antrum. Biopsied.Reactive gastropathy.  - A single mucosal papule (nodule) found in the stomach. Biopsied.Foveolar hyperplasia.  - 1 cm hiatal hernia. - A few gastric polyps. Biopsied.Fundic gland polyps. Peptic duodenitis.   He is presently taking omeprazole 20 mg twice a day.  Since then he states that he has had no issues with reflux or dysphagia. Current Outpatient Medications  Medication Sig Dispense Refill   Blood Glucose Monitoring Suppl ([place]) w/Device KIT See admin instructions.     cholecalciferol (VITAMIN D3) 25 MCG (1000 UNIT) tablet Take 1,000 Units by mouth daily.     citalopram (CELEXA) 20 MG tablet Take 40 mg by mouth daily.     ELIQUIS 5 MG TABS tablet Take 5 mg by mouth daily.     glipiZIDE (GLUCOTROL XL) 2.5 MG 24 hr tablet Take 2.5 mg by mouth daily.     insulin detemir (LEVEMIR) 100 UNIT/ML injection Inject 15 Units into the skin 2 (two) times daily.     Insulin Pen Needle 31G X 5 MM MISC Use as directed 60 each 2   Lancets (FREESTYLE) lancets Use as instructed 100 each 2   metFORMIN (GLUCOPHAGE) 1000 MG tablet Take 1,000 mg by mouth 2 (two) times daily with a meal.     Multiple Vitamin (MULTIVITAMIN WITH MINERALS) TABS  tablet Take 1 tablet by mouth daily.     ONETOUCH VERIO test strip 2 (two) times daily. use for testing     pantoprazole (PROTONIX) 40 MG tablet Take 1 tablet 2 times daily for 30 days, then 1 tablet once daily thereafter (Patient taking differently: Take 40 mg by mouth 2 (two) times daily.) 120 tablet 0   acetaminophen (TYLENOL) 500 MG tablet Take 500-1,000 mg by mouth every 6 (six) hours as needed for mild pain. (Patient not taking: Reported on 01/12/2022)     No current facility-administered medications for this visit.    Allergies as of 01/12/2022   (No Known Allergies)    ROS:  General: Negative for anorexia, weight loss, fever, chills, fatigue, weakness. ENT: Negative for hoarseness, difficulty swallowing , nasal congestion. CV: Negative for chest pain, angina, palpitations, dyspnea on exertion, peripheral edema.  Respiratory: Negative for dyspnea at rest, dyspnea on exertion, cough, sputum, wheezing.  GI: See history of present illness. GU:  Negative for dysuria, hematuria, urinary incontinence, urinary frequency, nocturnal urination.  Endo: Negative for unusual weight change.    Physical Examination:   BP 135/82    Pulse 90    Temp 98.3 F (36.8 C) (Oral)    Wt 197 lb 3.2 oz (89.4 kg)    BMI 27.50 kg/m   General: Well-nourished, well-developed in no acute distress.  Eyes: No icterus. Conjunctivae pink. Neuro: Alert and oriented x  3.  Grossly intact. Skin: Warm and dry, no jaundice.   Psych: Alert and cooperative, normal mood and affect.   Imaging Studies: [place] CORE BIOPSY (LYMPH NODES)  Result Date: 12/16/2021 INDICATION: History of CLL and enlarged right axillary lymph node. EXAM: ULTRASOUND GUIDED CORE BIOPSY OF RIGHT AXILLARY LYMPH NODE MEDICATIONS: None. ANESTHESIA/SEDATION: None PROCEDURE: The procedure, risks, benefits, and alternatives were explained to the patient. Questions regarding the procedure were encouraged and answered. The patient understands and consents  to the procedure. A time-out was performed prior to initiating the procedure. The right axilla was prepped with chlorhexidine in a sterile fashion, and a sterile drape was applied covering the operative field. A sterile gown and sterile gloves were used for the procedure. Local anesthesia was provided with 1% Lidocaine. A 16 gauge core biopsy device was utilized in obtaining 5 different samples at the level of an enlarged right axillary lymph node. Core biopsy samples were submitted on saline soaked Telfa gauze. Additional ultrasound was performed. COMPLICATIONS: None immediate. FINDINGS: Imaging at the level of the right axilla demonstrates in ill-defined enlarged lymph node measuring roughly 1.8 x 3.9 x 2.6 cm and corresponding to the lymph node seen by recent PET scan and CT of the chest. Solid tissue was obtained. IMPRESSION: Ultrasound-guided core biopsy performed of an enlarged right axillary lymph node. Electronically Signed   By: Aletta Edouard M.D.   On: 12/16/2021 15:19    Assessment and Plan:   Travis [name]. is a 71 y.o. y/o male here to follow up after recent EGD for dysphagia and heart burn found to have esophagitis, benign gastric polyps, peptic duodenitis, reactive gastropathy.  Presently all her symptoms of dysphagia have resolved with taking Prilosec 20 mg twice a day  Plan  EGD to check for healing of esophagitis and if healed consider dilation of stenosis Continue PPI No NSAID's I have informed him that if his EGD shows resolution of the esophagitis then I will decrease his omeprazole to 20 mg once a day.  Discussed the risks and benefits of long term PPI use including but not limited to bone loss, chronic kidney disease, infections , low magnesium . Aim to use at the lowest dose for the shortest period of time     I have discussed alternative options, risks & benefits,  which include, but are not limited to, bleeding, infection, perforation,respiratory complication & drug  reaction.  The patient agrees with this plan & written consent will be obtained.     Dr [name]  MD,MRCP [facility]) Follow up in as needed

## 2022-01-19 NOTE — Progress Notes (Signed)
01/20/22 2:13 PM   [name]. [birth year] [id number]  Referring provider:  Adin Hector, MD [facility] [place],  [place] [address] Chief Complaint  Patient presents with   Benign Prostatic Hypertrophy     HPI: Chadwin Fury. is a 71 y.o.male with a personal history urinary retention, acute kidney insufficieny, gross hematuria, and overflow incontinence, who presents today for  6 week follow-up post-op with IPSS and PVR.   He has a personal history of MERSA/sepsis in which a PICC line was placed.   He his s/p HoLEP on 12/08/2021. Intraoperative findings showed bilobar coaptaion of prostate with elevated bladder neck. Surgical pathology was negative for malignancy. 44g resected.   His catheter was removed on 12/15/2021 by Debroah Loop, PA-C.   He reports today that he has stopped using depends. Last weekend he noticed passage of fragments in his urine. He reports that he is not on any medications for his bladder.    IPSS     Row Name 01/20/22 1300         International Prostate Symptom Score   How often have you had the sensation of not emptying your bladder? Not at All     How often have you had to urinate less than every two hours? About half the time     How often have you found you stopped and started again several times when you urinated? Not at All     How often have you found it difficult to postpone urination? Less than 1 in 5 times     How often have you had a weak urinary stream? Not at All     How often have you had to strain to start urination? Not at All     How many times did you typically get up at night to urinate? 1 Time     Total IPSS Score 5       Quality of Life due to urinary symptoms   If you were to spend the rest of your life with your urinary condition just the way it is now how would you feel about that? Delighted              Score:  1-7 Mild 8-19 Moderate 20-35  Severe   PMH: Past Medical History:  Diagnosis Date   Anxiety    Depression    Diabetes mellitus without complication (HCC)    GERD (gastroesophageal reflux disease)    Leukemia (HCC)    chronic lymphocytic   Pneumonia     Surgical History: Past Surgical History:  Procedure Laterality Date   COLONOSCOPY     ESOPHAGOGASTRODUODENOSCOPY (EGD) WITH PROPOFOL N/A 03/14/2018   Procedure: ESOPHAGOGASTRODUODENOSCOPY (EGD) WITH PROPOFOL;  Surgeon: [name], MD;  Location: [facility] ENDOSCOPY;  Service: Gastroenterology;  Laterality: N/A;   ESOPHAGOGASTRODUODENOSCOPY (EGD) WITH PROPOFOL N/A 06/13/2018   Procedure: ESOPHAGOGASTRODUODENOSCOPY (EGD) WITH PROPOFOL;  Surgeon: [name], MD;  Location: [facility] ENDOSCOPY;  Service: Gastroenterology;  Laterality: N/A;   ESOPHAGOGASTRODUODENOSCOPY (EGD) WITH PROPOFOL N/A 10/09/2021   Procedure: ESOPHAGOGASTRODUODENOSCOPY (EGD) WITH PROPOFOL;  Surgeon: Virgel Manifold, MD;  Location: [facility] ENDOSCOPY;  Service: Endoscopy;  Laterality: N/A;   HOLEP-LASER ENUCLEATION OF THE PROSTATE WITH MORCELLATION N/A 12/08/2021   Procedure: HOLEP-LASER ENUCLEATION OF THE PROSTATE WITH MORCELLATION;  Surgeon: Hollice Espy, MD;  Location: [facility] ORS;  Service: Urology;  Laterality: N/A;   TEE WITHOUT CARDIOVERSION N/A 10/09/2021   Procedure: TRANSESOPHAGEAL ECHOCARDIOGRAM (TEE);  Surgeon: Virgel Manifold, MD;  Location: [facility] ENDOSCOPY;  Service: Endoscopy;  Laterality: N/A;   WISDOM TOOTH EXTRACTION      Home Medications:  Allergies as of 01/20/2022   No Known Allergies      Medication List        Accurate as of January 20, 2022  2:13 PM. If you have any questions, ask your nurse or doctor.          acetaminophen 500 MG tablet Commonly known as: TYLENOL Take 500-1,000 mg by mouth every 6 (six) hours as needed for mild pain.   cholecalciferol 25 MCG (1000 UNIT) tablet Commonly known as: VITAMIN D3 Take 1,000 Units by mouth daily.   citalopram 40  MG tablet Commonly known as: CELEXA Take 40 mg by mouth daily. What changed: Another medication with the same name was removed. Continue taking this medication, and follow the directions you see here. Changed by: Hollice Espy, MD   Eliquis 5 MG Tabs tablet Generic drug: apixaban Take 5 mg by mouth daily.   freestyle lancets Use as instructed   glipiZIDE 2.5 MG 24 hr tablet Commonly known as: GLUCOTROL XL Take 2.5 mg by mouth daily.   insulin detemir 100 UNIT/ML injection Commonly known as: LEVEMIR Inject 15 Units into the skin 2 (two) times daily.   Insulin Pen Needle 31G X 5 MM Misc Use as directed   metFORMIN 1000 MG tablet Commonly known as: GLUCOPHAGE Take 1,000 mg by mouth 2 (two) times daily with a meal.   metoprolol succinate 25 MG 24 hr tablet Commonly known as: TOPROL-XL Take 25 mg by mouth daily.   multivitamin with minerals Tabs tablet Take 1 tablet by mouth daily.   OneTouch Verio Flex System w/Device Kit See admin instructions.   OneTouch Verio test strip Generic drug: glucose blood 2 (two) times daily. use for testing   pantoprazole 40 MG tablet Commonly known as: PROTONIX Take 1 tablet 2 times daily for 30 days, then 1 tablet once daily thereafter What changed:  how much to take how to take this when to take this additional instructions        Allergies: No Known Allergies  Family History: Family History  Problem Relation Age of Onset   Leukemia Mother    Colon cancer Father    Diabetes Father    Heart disease Father    Diabetes Brother     Social History:  reports that he has never smoked. He has never used smokeless tobacco. He reports current alcohol use. He reports that he does not use drugs.   Physical Exam: BP 128/79    Pulse 69    Ht 5' 11"  (1.803 m)    Wt 185 lb (83.9 kg)    BMI 25.80 kg/m   Constitutional:  Alert and oriented, No acute distress. HEENT: [place] AT, moist mucus membranes.  Trachea midline, no  masses. Cardiovascular: No clubbing, cyanosis, or edema. Respiratory: Normal respiratory effort, no increased work of breathing. Skin: No rashes, bruises or suspicious lesions. Neurologic: Grossly intact, no focal deficits, moving all 4 extremities. Psychiatric: Normal mood and affect.  Laboratory Data:  Lab Results  Component Value Date   CREATININE 1.00 10/30/2021   Lab Results  Component Value Date   HGBA1C 11.4 (H) 10/06/2021    Pertinent Imaging: Results for orders placed or performed in visit on 01/20/22  BLADDER SCAN AMB NON-IMAGING  Result Value Ref Range   Scan Result 0 ml    Assessment &  Plan:    BPH with urinary retention  - S/p HoLEP  - He has seen improvement in his urinary symptoms - Discussed anticipated new baseline PSA with him today  - Plan to repeat PSA in 6 months   Return in 6 months for PSA, IPSS, and PVR  I,Kailey Littlejohn,acting as a scribe for Hollice Espy, MD.,have documented all relevant documentation on the behalf of Hollice Espy, MD,as directed by  Hollice Espy, MD while in the presence of Hollice Espy, MD.  I have reviewed the above documentation for accuracy and completeness, and I agree with the above.   Hollice Espy, MD   [facility] [address] [place], [place] [address] [phone]

## 2022-01-20 ENCOUNTER — Other Ambulatory Visit: Payer: Self-pay

## 2022-01-20 ENCOUNTER — Ambulatory Visit: Payer: BC Managed Care – PPO | Admitting: Urology

## 2022-01-20 VITALS — BP 128/79 | HR 69 | Ht 71.0 in | Wt 185.0 lb

## 2022-01-20 DIAGNOSIS — N138 Other obstructive and reflux uropathy: Secondary | ICD-10-CM | POA: Diagnosis not present

## 2022-01-20 DIAGNOSIS — N401 Enlarged prostate with lower urinary tract symptoms: Secondary | ICD-10-CM | POA: Diagnosis not present

## 2022-01-20 LAB — BLADDER SCAN AMB NON-IMAGING: Scan Result: 0

## 2022-02-06 ENCOUNTER — Other Ambulatory Visit: Payer: BC Managed Care – PPO

## 2022-02-13 ENCOUNTER — Encounter
Admission: RE | Disposition: A | Discharge status: Home / Self Care | Payer: Self-pay | Source: Home / Self Care | Attending: Gastroenterology

## 2022-02-13 ENCOUNTER — Ambulatory Visit
Admission: RE | Admit: 2022-02-13 | Discharge: 2022-02-13 | Disposition: A | Discharge status: Home / Self Care | Payer: BC Managed Care – PPO | Attending: Gastroenterology | Admitting: Gastroenterology

## 2022-02-13 DIAGNOSIS — Z538 Procedure and treatment not carried out for other reasons: Secondary | ICD-10-CM | POA: Diagnosis not present

## 2022-02-13 DIAGNOSIS — K2100 Gastro-esophageal reflux disease with esophagitis, without bleeding: Secondary | ICD-10-CM | POA: Insufficient documentation

## 2022-02-13 SURGERY — ESOPHAGOGASTRODUODENOSCOPY (EGD) WITH PROPOFOL
Anesthesia: General

## 2022-02-13 MED ORDER — SODIUM CHLORIDE 0.9 % IV SOLN: INTRAVENOUS | Status: DC

## 2022-02-13 MED ORDER — LIDOCAINE HCL (PF) 2 % IJ SOLN
INTRAMUSCULAR | Status: AC
  Filled 2022-02-13: qty 5

## 2022-02-13 MED ORDER — PROPOFOL 500 MG/50ML IV EMUL
INTRAVENOUS | Status: AC
  Filled 2022-02-13: qty 50

## 2022-02-13 NOTE — OR Nursing (Signed)
Patient took Eliquis yesterday.

## 2022-03-23 ENCOUNTER — Other Ambulatory Visit: Payer: Self-pay

## 2022-03-23 ENCOUNTER — Ambulatory Visit
Admission: RE | Admit: 2022-03-23 | Discharge: 2022-03-23 | Disposition: A | Discharge status: Home / Self Care | Payer: BC Managed Care – PPO | Source: Ambulatory Visit | Attending: Oncology | Admitting: Oncology

## 2022-03-23 DIAGNOSIS — C9110 Chronic lymphocytic leukemia of B-cell type not having achieved remission: Secondary | ICD-10-CM | POA: Diagnosis not present

## 2022-03-23 IMAGING — CT CT CHEST-ABD-PELV W/ CM
2 of 5 series · 12 of 36 positions shown, 14 images · IV contrast (agent unspecified)
Comparison: PET-CT [DATE].

CLINICAL DATA: Chronic lymphocytic leukemia.

EXAM:
CT CHEST, ABDOMEN, AND PELVIS WITH CONTRAST
TECHNIQUE: Multidetector CT imaging of the chest, abdomen and pelvis was
performed following the standard protocol during bolus
administration of intravenous contrast.

[Series 2: cap with · axial · 0.98mm/px · z∈[-371,+239]mm · 9 of 154 slices shown, 11 images]
[im 16/154  mediastinal]
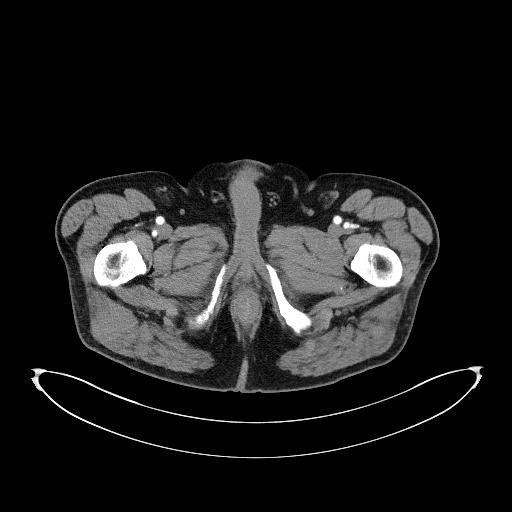
[im 16/154  bone]
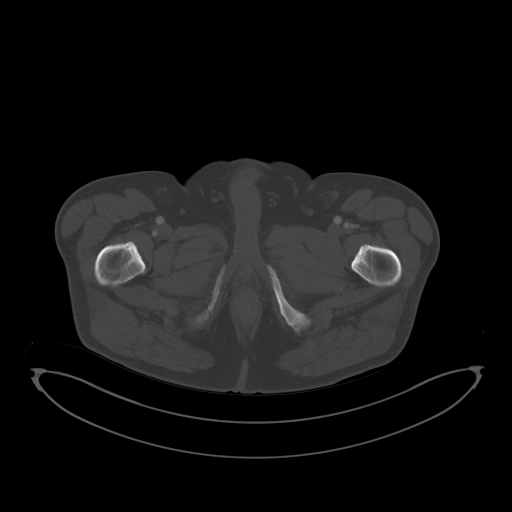
[im 31/154  mediastinal]
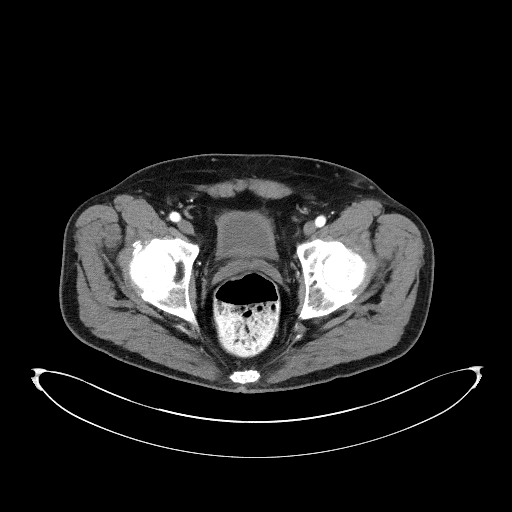
[im 46/154  mediastinal]
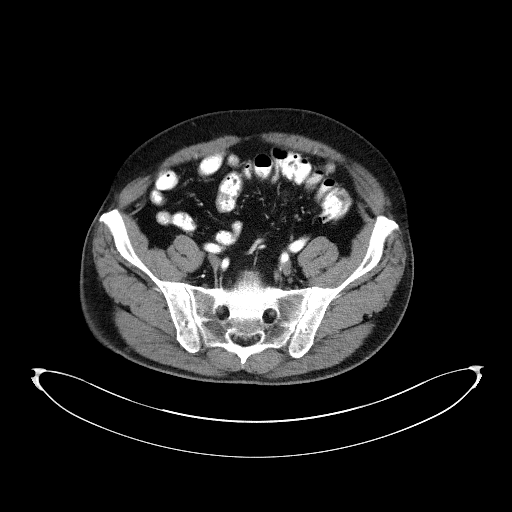
[im 62/154  mediastinal]
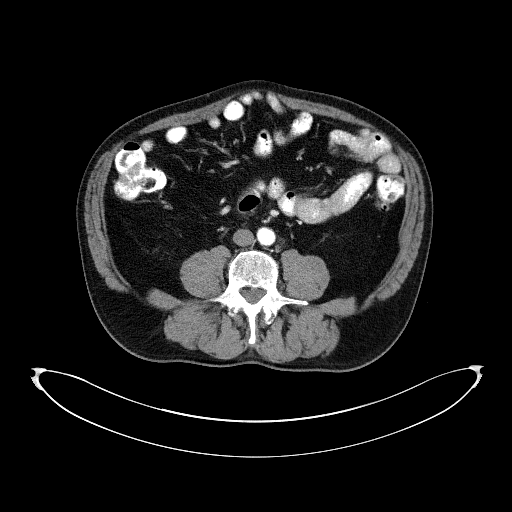
[im 77/154  mediastinal]
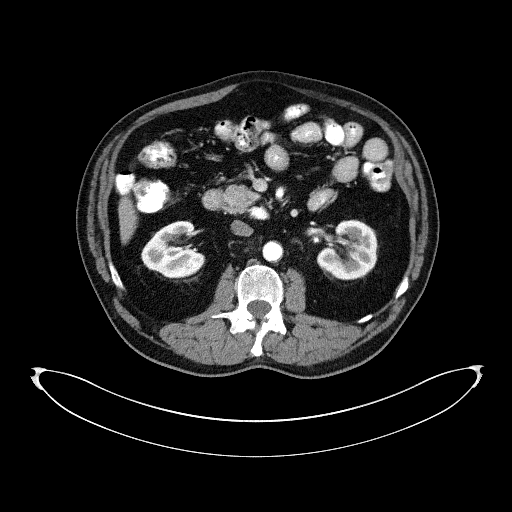
[im 92/154  mediastinal]
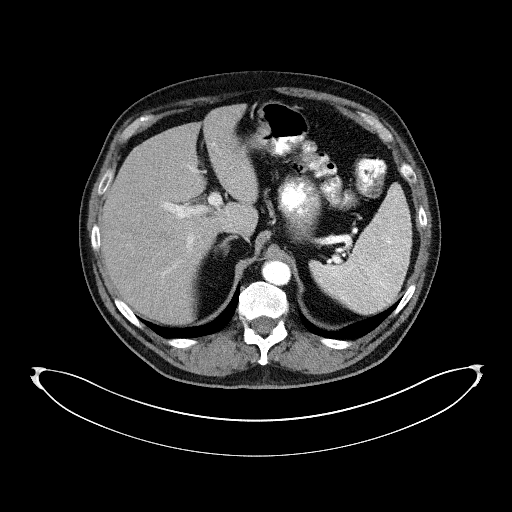
[im 108/154  mediastinal]
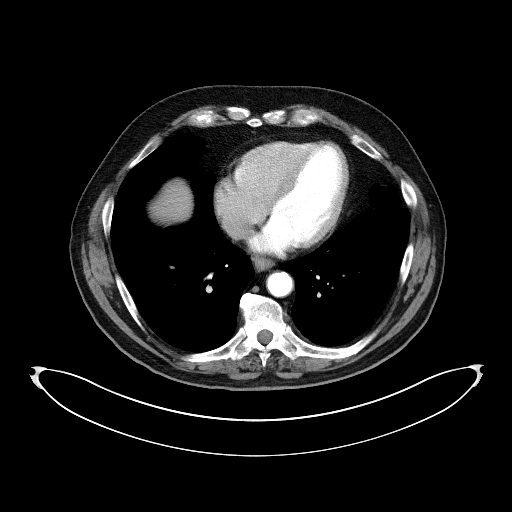
[im 123/154  mediastinal]
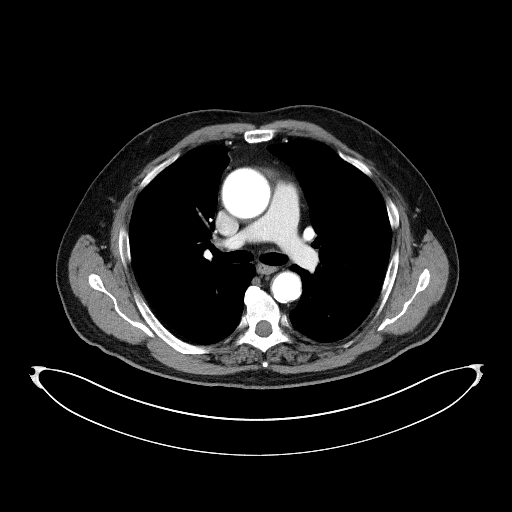
[im 138/154  mediastinal]
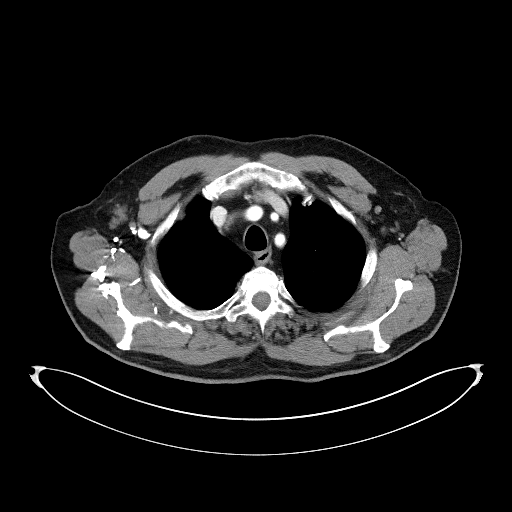
[im 138/154  bone]
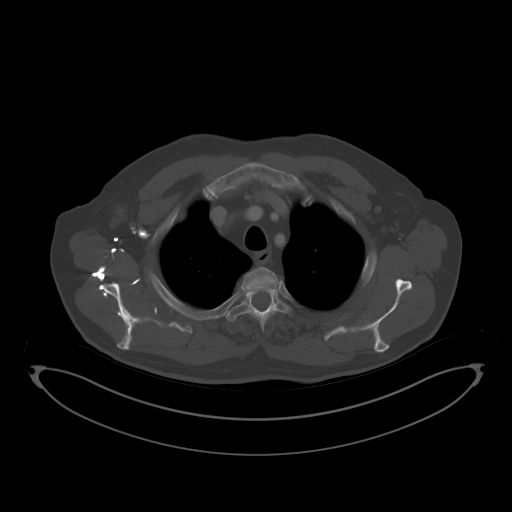

[Series 4: coronals · coronal · 0.85mm/px · 3 of 160 slices shown]
[im 32/160  mediastinal]
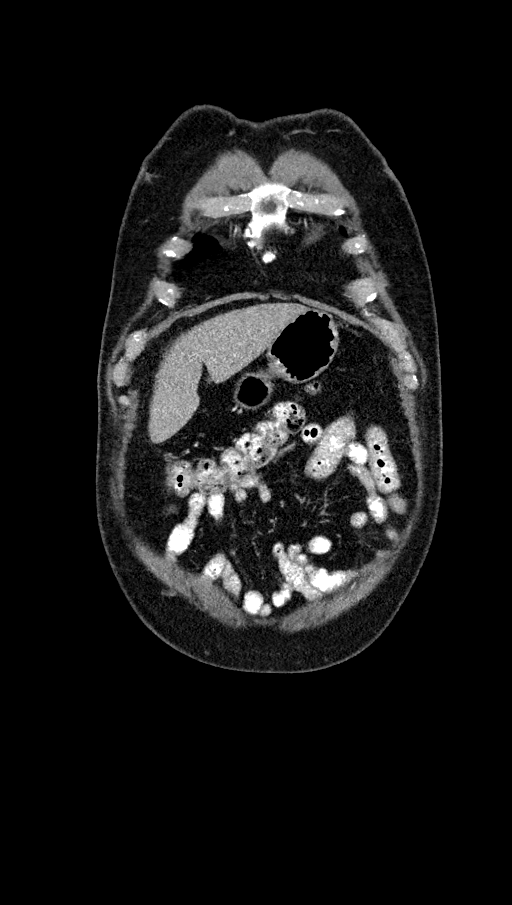
[im 64/160  mediastinal]
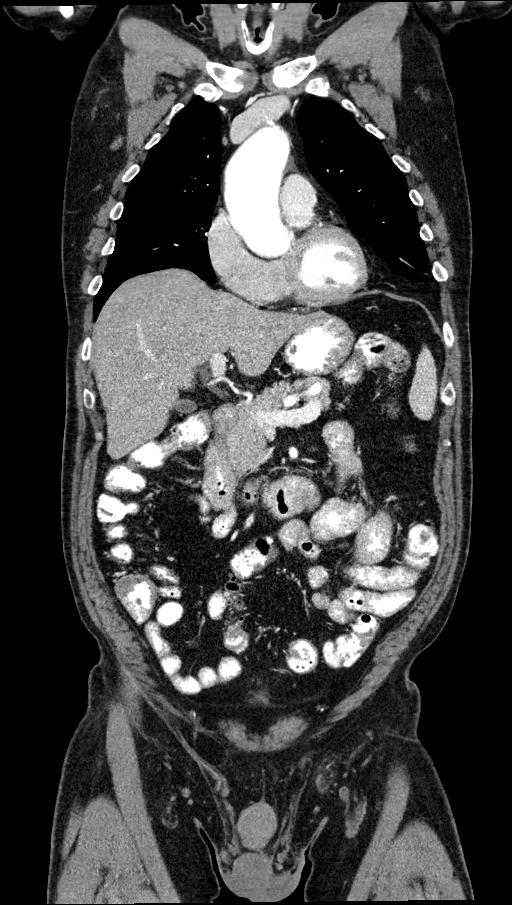
[im 96/160  mediastinal]
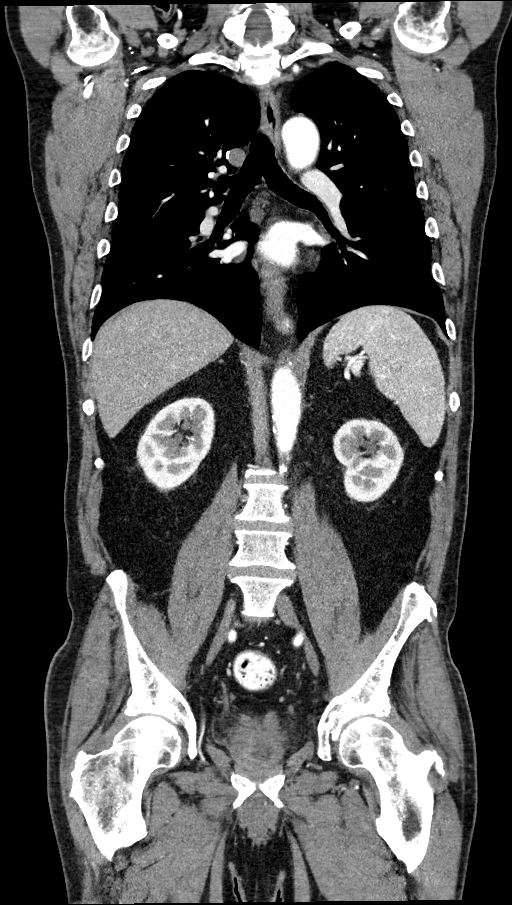

[12 of 36 positions shown; findings below may reference images not displayed]

RADIATION DOSE REDUCTION: This exam was performed according to the
departmental dose-optimization program which includes automated
exposure control, adjustment of the mA and/or kV according to
patient size and/or use of iterative reconstruction technique.

CONTRAST:  100mL OMNIPAQUE IOHEXOL 300 MG/ML  SOLN
FINDINGS: CT CHEST FINDINGS

Cardiovascular: The heart size is normal. No substantial pericardial
effusion. Coronary artery calcification is evident. Mild
atherosclerotic calcification is noted in the wall of the thoracic
aorta. Ascending thoracic aorta measures 4.7 cm diameter.

Mediastinum/Nodes: No mediastinal lymphadenopathy. There is no hilar
lymphadenopathy. Tiny hiatal hernia. The esophagus has normal
imaging features. Right axillary node measured at 12 mm short axis
on prior study is 10 mm short axis today. 8 mm short axis left
axillary node on today's exam was 6 mm short axis previously
(remeasured).

Lungs/Pleura: 5 mm right upper lobe sub solid nodule on 49/7 was 9
mm previously. Other similar ill-defined pulmonary nodules are
markedly decreased in the interval and some of these have resolved
completely including an 18 mm right lower lobe nodule seen
previously on [DATE]. No new suspicious pulmonary nodule or mass.
No focal airspace consolidation. No pleural effusion.

Musculoskeletal: No worrisome lytic or sclerotic osseous
abnormality.

CT ABDOMEN PELVIS FINDINGS

Hepatobiliary: No suspicious focal abnormality within the liver
parenchyma. There is no evidence for gallstones, gallbladder wall
thickening, or pericholecystic fluid. No intrahepatic or
extrahepatic biliary dilation.

Pancreas: No focal mass lesion. No dilatation of the main duct. No
intraparenchymal cyst. No peripancreatic edema.

Spleen: No splenomegaly. No focal mass lesion.

Adrenals/Urinary Tract: No adrenal nodule or mass. Kidneys
unremarkable. No evidence for hydroureter. The urinary bladder
appears normal for the degree of distention.

Stomach/Bowel: Tiny hiatal hernia. Stomach is unremarkable. No
gastric wall thickening. No evidence of outlet obstruction. Duodenum
is normally positioned as is the ligament of Treitz. No small bowel
wall thickening. No small bowel dilatation. The terminal ileum is
normal. The appendix is normal. No gross colonic mass. No colonic
wall thickening. Diverticular changes are noted in the left colon
without evidence of diverticulitis.

Vascular/Lymphatic: There is mild atherosclerotic calcification of
the abdominal aorta without aneurysm. 9 mm short axis aortocaval
node on image 80/2 was 9 mm previously (remeasured). Index left
common iliac node measured previously at 8 mm short axis is 7 mm
today on image 99/2. Index left external iliac node measured
[DATE].

Reproductive: Central lucency in the prostate gland likely related
to TURP defect.

Other: No intraperitoneal free fluid.

Musculoskeletal: Small umbilical hernia contains small bowel loops
without complicating features. No worrisome lytic or sclerotic
osseous abnormality.
IMPRESSION: 1. Interval decrease in size and in some cases complete resolution
of multiple ill-defined bilateral pulmonary nodules seen previously.
2. Stable appearance of borderline enlarged axillary,
retroperitoneal and left pelvic sidewall lymph nodes. No new or
progressive lymphadenopathy on today's study.
3. 4.9 cm diameter ascending thoracic aorta. Ascending thoracic
aortic aneurysm. Recommend semi-annual imaging followup by CTA or
MRA and referral to cardiothoracic surgery if not already obtained.
This recommendation follows [8S]
ACCF/AHA/AATS/ACR/ASA/SCA/RUDI/RUDI/RUDI/RUDI Guidelines for the
Diagnosis and Management of Patients With Thoracic Aortic Disease.
Circulation. [8S]; 121: [id number]. Aortic aneurysm NOS ([8S]-[8S])
4. Tiny hiatal hernia.
5. Small umbilical hernia contains small bowel loops without
complicating features.
6. Aortic Atherosclerosis ([8S]-[8S]).

## 2022-03-23 MED ORDER — IOHEXOL 300 MG/ML  SOLN
100.0000 mL | Freq: Once | INTRAMUSCULAR | Status: AC | PRN
  Administered 2022-03-23: 100 mL via INTRAVENOUS

## 2022-03-24 NOTE — Progress Notes (Signed)
?[place]  ?[phone] [phone] ? ?ID: [name]. OB: [birth year]  MR#: [id number]  MWU#:[id number] ? ?Patient Care Team: ?Adin Hector, MD as PCP - General (Internal Medicine) ?Lucilla Lame, MD as Consulting Physician (Gastroenterology) ?Anabel Bene, MD as Referring Physician (Neurology) ? 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 ? ?INTERVAL HISTORY: Patient returns to clinic today for repeat laboratory work, further evaluation, discussion of his imaging results.  Currently feels well and is at his baseline.  He feels significantly improved after discontinuing Imbruvica in November 2022.  He has no new neurologic complaints.  He denies any fevers.  He has a good appetite and denies weight loss.  He denies any chest pain, shortness of breath, cough, or hemoptysis.  He denies any nausea, vomiting, constipation, or diarrhea. He has no urinary complaints.  Patient offers no further specific complaints today. ? ?REVIEW OF SYSTEMS:   ?Review of Systems  ?Constitutional: Negative.  Negative for diaphoresis, fever, malaise/fatigue and weight loss.  ?Respiratory: Negative.  Negative for cough and shortness of breath.   ?Cardiovascular: Negative.  Negative for chest pain and leg swelling.  ?Gastrointestinal:  Negative for abdominal pain, blood in stool, melena, nausea and vomiting.  ?Genitourinary: Negative.  Negative for dysuria.  ?Musculoskeletal: Negative.  Negative for back pain.  ?Skin: Negative.  Negative for rash.  ?Neurological:  Positive for focal weakness. Negative for dizziness, tingling, sensory change, weakness and headaches.  ?Psychiatric/Behavioral: Negative.  Negative for depression. The patient is not nervous/anxious and does not have insomnia.   ? ?As per HPI. Otherwise, a complete review of systems is negative. ? ?PAST MEDICAL HISTORY: ?Past Medical History:  ?Diagnosis Date  ? Anxiety   ? Depression   ? Diabetes mellitus  without complication ([place])   ? GERD (gastroesophageal reflux disease)   ? Leukemia ([place])   ? chronic lymphocytic  ? Pneumonia   ? ? ?PAST SURGICAL HISTORY: ?Past Surgical History:  ?Procedure Laterality Date  ? COLONOSCOPY    ? ESOPHAGOGASTRODUODENOSCOPY (EGD) WITH PROPOFOL N/A 03/14/2018  ? Procedure: ESOPHAGOGASTRODUODENOSCOPY (EGD) WITH PROPOFOL;  Surgeon: [name], MD;  Location: [facility] ENDOSCOPY;  Service: Gastroenterology;  Laterality: N/A;  ? ESOPHAGOGASTRODUODENOSCOPY (EGD) WITH PROPOFOL N/A 06/13/2018  ? Procedure: ESOPHAGOGASTRODUODENOSCOPY (EGD) WITH PROPOFOL;  Surgeon: [name], MD;  Location: [facility] ENDOSCOPY;  Service: Gastroenterology;  Laterality: N/A;  ? ESOPHAGOGASTRODUODENOSCOPY (EGD) WITH PROPOFOL N/A 10/09/2021  ? Procedure: ESOPHAGOGASTRODUODENOSCOPY (EGD) WITH PROPOFOL;  Surgeon: Virgel Manifold, MD;  Location: [facility] ENDOSCOPY;  Service: Endoscopy;  Laterality: N/A;  ? HOLEP-LASER ENUCLEATION OF THE PROSTATE WITH MORCELLATION N/A 12/08/2021  ? Procedure: HOLEP-LASER ENUCLEATION OF THE PROSTATE WITH MORCELLATION;  Surgeon: Hollice Espy, MD;  Location: [facility] ORS;  Service: Urology;  Laterality: N/A;  ? TEE WITHOUT CARDIOVERSION N/A 10/09/2021  ? Procedure: TRANSESOPHAGEAL ECHOCARDIOGRAM (TEE);  Surgeon: Virgel Manifold, MD;  Location: [facility] ENDOSCOPY;  Service: Endoscopy;  Laterality: N/A;  ? WISDOM TOOTH EXTRACTION    ? ? ?FAMILY HISTORY: ?Family History  ?Problem Relation Age of Onset  ? Leukemia Mother   ? Colon cancer Father   ? Diabetes Father   ? Heart disease Father   ? Diabetes Brother   ? ? ?ADVANCED DIRECTIVES (Y/N):  N ? ?HEALTH MAINTENANCE: ?Social History  ? ?Tobacco Use  ? Smoking status: Never  ? Smokeless tobacco: Never  ?Vaping Use  ? Vaping Use: Never used  ?Substance Use Topics  ? Alcohol use: Yes  ?  Comment: not at all  ? Drug use: No  ? ? ? Colonoscopy: ? PAP: ? Bone density: ? Lipid panel: ? ?No Known Allergies ? ?Current Outpatient Medications  ?Medication Sig  Dispense Refill  ? acetaminophen (TYLENOL) 500 MG tablet Take 500-1,000 mg by mouth every 6 (six) hours as needed for mild pain.    ? Blood Glucose Monitoring Suppl ([place]) w/Device KIT See admin instructions.    ? cholecalciferol (VITAMIN D3) 25 MCG (1000 UNIT) tablet Take 1,000 Units by mouth daily.    ? citalopram (CELEXA) 40 MG tablet Take 40 mg by mouth daily.    ? ELIQUIS 5 MG TABS tablet Take 5 mg by mouth daily.    ? glipiZIDE (GLUCOTROL XL) 2.5 MG 24 hr tablet Take 2.5 mg by mouth daily.    ? insulin detemir (LEVEMIR) 100 UNIT/ML injection Inject 15 Units into the skin 2 (two) times daily.    ? Insulin Pen Needle 31G X 5 MM MISC Use as directed 60 each 2  ? Lancets (FREESTYLE) lancets Use as instructed 100 each 2  ? metFORMIN (GLUCOPHAGE) 1000 MG tablet Take 1,000 mg by mouth 2 (two) times daily with a meal.    ? metoprolol succinate (TOPROL-XL) 25 MG 24 hr tablet Take 25 mg by mouth daily.    ? Multiple Vitamin (MULTIVITAMIN WITH MINERALS) TABS tablet Take 1 tablet by mouth daily.    ? ONETOUCH VERIO test strip 2 (two) times daily. use for testing    ? pantoprazole (PROTONIX) 40 MG tablet Take 1 tablet 2 times daily for 30 days, then 1 tablet once daily thereafter (Patient taking differently: Take 40 mg by mouth 2 (two) times daily.) 120 tablet 0  ? ?No current facility-administered medications for this visit.  ? ? ?OBJECTIVE: ?Vitals:  ? 03/26/22 1049  ?BP: 117/89  ?Pulse: 69  ?Resp: 18  ?Temp: (!) 96.4 ?F (35.8 ?C)  ?SpO2: 99%  ?   Body mass index is 28.06 kg/m?Marland Kitchen    ECOG FS:1 - Symptomatic but completely ambulatory ? ?General: Well-developed, well-nourished, no acute distress. ?Eyes: Pink conjunctiva, anicteric sclera. ?HEENT: Normocephalic, moist mucous membranes. ?Lungs: No audible wheezing or coughing. ?Heart: Regular rate and rhythm. ?Abdomen: Soft, nontender, no obvious distention. ?Musculoskeletal: No edema, cyanosis, or clubbing. ?Neuro: Alert, answering all questions  appropriately. Cranial nerves grossly intact. ?Skin: No rashes or petechiae noted. ?Psych: Normal affect. ? ? ?LAB RESULTS: ? ?Lab Results  ?Component Value Date  ? NA 136 03/26/2022  ? K 4.3 03/26/2022  ? CL 105 03/26/2022  ? CO2 24 03/26/2022  ? GLUCOSE 153 (H) 03/26/2022  ? BUN 18 03/26/2022  ? CREATININE 1.18 03/26/2022  ? CALCIUM 8.6 (L) 03/26/2022  ? PROT 6.0 (L) 03/26/2022  ? ALBUMIN 3.6 03/26/2022  ? AST 15 03/26/2022  ? ALT 15 03/26/2022  ? ALKPHOS 52 03/26/2022  ? BILITOT 0.7 03/26/2022  ? GFRNONAA >60 03/26/2022  ? GFRAA >60 07/05/2020  ? ? ?Lab Results  ?Component Value Date  ? WBC 5.2 03/26/2022  ? NEUTROABS 27.5 (H) 10/05/2021  ? HGB 13.6 03/26/2022  ? HCT 40.1 03/26/2022  ? MCV 79.1 (L) 03/26/2022  ? PLT 166 03/26/2022  ? ? ? ?STUDIES: ?CT CHEST ABDOMEN PELVIS W CONTRAST ? ?Result Date: 03/24/2022 ?CLINICAL DATA:  Chronic lymphocytic leukemia. EXAM: CT CHEST, ABDOMEN, AND PELVIS WITH CONTRAST TECHNIQUE: Multidetector CT imaging of the chest, abdomen and pelvis was performed following the standard protocol during bolus administration of intravenous contrast.  RADIATION DOSE REDUCTION: This exam was performed according to the departmental dose-optimization program which includes automated exposure control, adjustment of the mA and/or kV according to patient size and/or use of iterative reconstruction technique. CONTRAST:  110m OMNIPAQUE IOHEXOL 300 MG/ML  SOLN COMPARISON:  PET-CT 12/03/2021. FINDINGS: CT CHEST FINDINGS Cardiovascular: The heart size is normal. No substantial pericardial effusion. Coronary artery calcification is evident. Mild atherosclerotic calcification is noted in the wall of the thoracic aorta. Ascending thoracic aorta measures 4.7 cm diameter. Mediastinum/Nodes: No mediastinal lymphadenopathy. There is no hilar lymphadenopathy. Tiny hiatal hernia. The esophagus has normal imaging features. Right axillary node measured at 12 mm short axis on prior study is 10 mm short axis today. 8  mm short axis left axillary node on today's exam was 6 mm short axis previously (remeasured). Lungs/Pleura: 5 mm right upper lobe sub solid nodule on 49/7 was 9 mm previously. Other similar ill-defined pulmonary

## 2022-03-26 ENCOUNTER — Inpatient Hospital Stay: Payer: BC Managed Care – PPO | Attending: Oncology | Admitting: Oncology

## 2022-03-26 ENCOUNTER — Inpatient Hospital Stay: Payer: BC Managed Care – PPO

## 2022-03-26 ENCOUNTER — Encounter: Payer: Self-pay | Admitting: Oncology

## 2022-03-26 VITALS — BP 117/89 | HR 69 | Temp 96.4°F | Resp 18 | Wt 201.2 lb

## 2022-03-26 DIAGNOSIS — Z800 Family history of malignant neoplasm of digestive organs: Secondary | ICD-10-CM | POA: Insufficient documentation

## 2022-03-26 DIAGNOSIS — E1140 Type 2 diabetes mellitus with diabetic neuropathy, unspecified: Secondary | ICD-10-CM | POA: Diagnosis not present

## 2022-03-26 DIAGNOSIS — N401 Enlarged prostate with lower urinary tract symptoms: Secondary | ICD-10-CM | POA: Insufficient documentation

## 2022-03-26 DIAGNOSIS — C9110 Chronic lymphocytic leukemia of B-cell type not having achieved remission: Secondary | ICD-10-CM | POA: Insufficient documentation

## 2022-03-26 DIAGNOSIS — R1310 Dysphagia, unspecified: Secondary | ICD-10-CM | POA: Diagnosis not present

## 2022-03-26 DIAGNOSIS — E1165 Type 2 diabetes mellitus with hyperglycemia: Secondary | ICD-10-CM | POA: Insufficient documentation

## 2022-03-26 DIAGNOSIS — Z806 Family history of leukemia: Secondary | ICD-10-CM | POA: Diagnosis not present

## 2022-03-26 DIAGNOSIS — N138 Other obstructive and reflux uropathy: Secondary | ICD-10-CM | POA: Insufficient documentation

## 2022-03-26 DIAGNOSIS — D696 Thrombocytopenia, unspecified: Secondary | ICD-10-CM | POA: Insufficient documentation

## 2022-03-26 DIAGNOSIS — M21379 Foot drop, unspecified foot: Secondary | ICD-10-CM | POA: Diagnosis not present

## 2022-03-26 DIAGNOSIS — R918 Other nonspecific abnormal finding of lung field: Secondary | ICD-10-CM | POA: Insufficient documentation

## 2022-03-26 LAB — COMPREHENSIVE METABOLIC PANEL
ALT: 15 U/L (ref 0–44)
AST: 15 U/L (ref 15–41)
Albumin: 3.6 g/dL (ref 3.5–5.0)
Alkaline Phosphatase: 52 U/L (ref 38–126)
Anion gap: 7 (ref 5–15)
BUN: 18 mg/dL (ref 8–23)
CO2: 24 mmol/L (ref 22–32)
Calcium: 8.6 mg/dL — ABNORMAL LOW (ref 8.9–10.3)
Chloride: 105 mmol/L (ref 98–111)
Creatinine, Ser: 1.18 mg/dL (ref 0.61–1.24)
GFR, Estimated: >60 mL/min (ref >60)
Glucose, Bld: 153 mg/dL — ABNORMAL HIGH (ref 70–99)
Potassium: 4.3 mmol/L (ref 3.5–5.1)
Sodium: 136 mmol/L (ref 135–145)
Total Bilirubin: 0.7 mg/dL (ref 0.3–1.2)
Total Protein: 6 g/dL — ABNORMAL LOW (ref 6.5–8.1)

## 2022-03-26 LAB — CBC
HCT: 40.1 % (ref 39.0–52.0)
Hemoglobin: 13.6 g/dL (ref 13.0–17.0)
MCH: 26.8 pg (ref 26.0–34.0)
MCHC: 33.9 g/dL (ref 30.0–36.0)
MCV: 79.1 fL — ABNORMAL LOW (ref 80.0–100.0)
Platelets: 166 10*3/uL (ref 150–400)
RBC: 5.07 MIL/uL (ref 4.22–5.81)
RDW: 13.7 % (ref 11.5–15.5)
WBC: 5.2 10*3/uL (ref 4.0–10.5)
nRBC: 0 % (ref 0.0–0.2)

## 2022-03-26 NOTE — Progress Notes (Signed)
Pt states at times he sees people in the corner of his eyes. ?

## 2022-03-31 ENCOUNTER — Other Ambulatory Visit: Payer: Self-pay

## 2022-03-31 MED ORDER — OMEPRAZOLE 20 MG PO CPDR: 20.0000 mg | DELAYED_RELEASE_CAPSULE | Freq: Every day | ORAL | 3 refills | Status: DC

## 2022-03-31 NOTE — Telephone Encounter (Signed)
Dr. Vicente Males, can this patient have a refill on his Omeprazole 20 MG? Please advise. ?

## 2022-05-06 ENCOUNTER — Other Ambulatory Visit: Payer: BC Managed Care – PPO

## 2022-05-06 ENCOUNTER — Ambulatory Visit: Payer: BC Managed Care – PPO | Admitting: Oncology

## 2022-05-20 NOTE — Telephone Encounter (Signed)
error 

## 2022-06-26 ENCOUNTER — Inpatient Hospital Stay: Payer: BC Managed Care – PPO | Attending: Oncology

## 2022-06-26 DIAGNOSIS — D696 Thrombocytopenia, unspecified: Secondary | ICD-10-CM | POA: Insufficient documentation

## 2022-06-26 DIAGNOSIS — C9110 Chronic lymphocytic leukemia of B-cell type not having achieved remission: Secondary | ICD-10-CM | POA: Diagnosis not present

## 2022-06-26 LAB — CBC
HCT: 43.1 % (ref 39.0–52.0)
Hemoglobin: 14.5 g/dL (ref 13.0–17.0)
MCH: 27.9 pg (ref 26.0–34.0)
MCHC: 33.6 g/dL (ref 30.0–36.0)
MCV: 83 fL (ref 80.0–100.0)
Platelets: 162 10*3/uL (ref 150–400)
RBC: 5.19 MIL/uL (ref 4.22–5.81)
RDW: 12.7 % (ref 11.5–15.5)
WBC: 6.6 10*3/uL (ref 4.0–10.5)
nRBC: 0 % (ref 0.0–0.2)

## 2022-06-26 LAB — COMPREHENSIVE METABOLIC PANEL
ALT: 18 U/L (ref 0–44)
AST: 17 U/L (ref 15–41)
Albumin: 3.8 g/dL (ref 3.5–5.0)
Alkaline Phosphatase: 49 U/L (ref 38–126)
Anion gap: 5 (ref 5–15)
BUN: 19 mg/dL (ref 8–23)
CO2: 27 mmol/L (ref 22–32)
Calcium: 8.4 mg/dL — ABNORMAL LOW (ref 8.9–10.3)
Chloride: 104 mmol/L (ref 98–111)
Creatinine, Ser: 1.05 mg/dL (ref 0.61–1.24)
GFR, Estimated: >60 mL/min (ref >60)
Glucose, Bld: 116 mg/dL — ABNORMAL HIGH (ref 70–99)
Potassium: 4.6 mmol/L (ref 3.5–5.1)
Sodium: 136 mmol/L (ref 135–145)
Total Bilirubin: 0.9 mg/dL (ref 0.3–1.2)
Total Protein: 5.9 g/dL — ABNORMAL LOW (ref 6.5–8.1)

## 2022-07-20 ENCOUNTER — Other Ambulatory Visit: Payer: BC Managed Care – PPO

## 2022-07-20 DIAGNOSIS — N138 Other obstructive and reflux uropathy: Secondary | ICD-10-CM

## 2022-07-21 LAB — PSA: Prostate Specific Ag, Serum: 0.6 ng/mL (ref 0.0–4.0)

## 2022-07-22 ENCOUNTER — Ambulatory Visit: Payer: BC Managed Care – PPO | Admitting: Urology

## 2022-07-28 ENCOUNTER — Ambulatory Visit: Payer: BC Managed Care – PPO | Admitting: Urology

## 2022-07-28 VITALS — BP 125/81 | HR 74 | Ht 71.0 in | Wt 200.0 lb

## 2022-07-28 DIAGNOSIS — N138 Other obstructive and reflux uropathy: Secondary | ICD-10-CM

## 2022-07-28 DIAGNOSIS — N529 Male erectile dysfunction, unspecified: Secondary | ICD-10-CM

## 2022-07-28 DIAGNOSIS — N401 Enlarged prostate with lower urinary tract symptoms: Secondary | ICD-10-CM | POA: Diagnosis not present

## 2022-07-28 LAB — BLADDER SCAN AMB NON-IMAGING: Scan Result: 14

## 2022-07-28 MED ORDER — SILDENAFIL CITRATE 20 MG PO TABS: ORAL_TABLET | ORAL | 12 refills | Status: DC

## 2022-07-28 NOTE — Progress Notes (Signed)
07/28/22 8:54 AM   Travis [name]. [birth year] [id number]  Referring provider:  Adin Hector, MD [facility] [place],  [place] [address] Chief Complaint  Patient presents with   Benign Prostatic Hypertrophy      HPI: Travis [name]. is a 71 y.o.male with a personal history of urinary retention, acute kidney insufficieny, gross hematuria, and overflow incontinence, who presents today for a 6 month follow-up with IPSS, PVR, and prior PSA.  He has a personal history of MERSA/sepsis in which a PICC line was placed.    He his s/p HoLEP on 12/08/2021. Intraoperative findings showed bilobar coaptaion of prostate with elevated bladder neck. Surgical pathology was negative for malignancy. 44g resected.   He reports ED he tried generic viagra in the past.  His primary concern today is inability to maintain his erections.  He has no contraindications for PDE 5 inhibitors.  He denies any modifying or aggravating factors.  Patient denies any gross hematuria, dysuria or suprapubic/flank pain.  Patient denies any fevers, chills, nausea or vomiting.   IPSS 4 and PVR 56m today.    IPSS     Row Name 07/28/22 0800         International Prostate Symptom Score   How often have you had the sensation of not emptying your bladder? Not at All     How often have you had to urinate less than every two hours? Less than 1 in 5 times     How often have you found you stopped and started again several times when you urinated? Not at All     How often have you found it difficult to postpone urination? Less than half the time     How often have you had a weak urinary stream? Not at All     How often have you had to strain to start urination? Not at All     How many times did you typically get up at night to urinate? 1 Time     Total IPSS Score 4       Quality of Life due to urinary symptoms   If you were to spend the rest of your life with your urinary condition  just the way it is now how would you feel about that? Delighted              Score:  1-7 Mild 8-19 Moderate 20-35 Severe      PMH: Past Medical History:  Diagnosis Date   Anxiety    Depression    Diabetes mellitus without complication (HCC)    GERD (gastroesophageal reflux disease)    Leukemia (HCC)    chronic lymphocytic   Pneumonia     Surgical History: Past Surgical History:  Procedure Laterality Date   COLONOSCOPY     ESOPHAGOGASTRODUODENOSCOPY (EGD) WITH PROPOFOL N/A 03/14/2018   Procedure: ESOPHAGOGASTRODUODENOSCOPY (EGD) WITH PROPOFOL;  Surgeon: AJonathon Bellows MD;  [facility];  Service: Gastroenterology;  Laterality: N/A;   ESOPHAGOGASTRODUODENOSCOPY (EGD) WITH PROPOFOL N/A 06/13/2018   Procedure: ESOPHAGOGASTRODUODENOSCOPY (EGD) WITH PROPOFOL;  Surgeon: AJonathon Bellows MD;  [facility]  Service: Gastroenterology;  Laterality: N/A;   ESOPHAGOGASTRODUODENOSCOPY (EGD) WITH PROPOFOL N/A 10/09/2021   Procedure: ESOPHAGOGASTRODUODENOSCOPY (EGD) WITH PROPOFOL;  Surgeon: TVirgel Manifold MD;  Location: [facility] ENDOSCOPY;  Service: Endoscopy;  Laterality: N/A;   HOLEP-LASER ENUCLEATION OF THE PROSTATE WITH MORCELLATION N/A 12/08/2021   Procedure: HOLEP-LASER ENUCLEATION OF THE PROSTATE WITH MORCELLATION;  Surgeon: Hollice Espy, MD;  Location: [facility] ORS;  Service: Urology;  Laterality: N/A;   TEE WITHOUT CARDIOVERSION N/A 10/09/2021   Procedure: TRANSESOPHAGEAL ECHOCARDIOGRAM (TEE);  Surgeon: Virgel Manifold, MD;  Location: [facility] ENDOSCOPY;  Service: Endoscopy;  Laterality: N/A;   WISDOM TOOTH EXTRACTION      Home Medications:  Allergies as of 07/28/2022   No Known Allergies      Medication List        Accurate as of July 28, 2022  8:54 AM. If you have any questions, ask your nurse or doctor.          acetaminophen 500 MG tablet Commonly known as: TYLENOL Take 500-1,000 mg by mouth every 6 (six) hours as needed for mild pain.    cholecalciferol 25 MCG (1000 UNIT) tablet Commonly known as: VITAMIN D3 Take 1,000 Units by mouth daily.   citalopram 40 MG tablet Commonly known as: CELEXA Take 40 mg by mouth daily.   Eliquis 5 MG Tabs tablet Generic drug: apixaban Take 5 mg by mouth daily.   freestyle lancets Use as instructed   glipiZIDE 2.5 MG 24 hr tablet Commonly known as: GLUCOTROL XL Take 2.5 mg by mouth daily.   insulin detemir 100 UNIT/ML injection Commonly known as: LEVEMIR Inject 15 Units into the skin 2 (two) times daily.   Insulin Pen Needle 31G X 5 MM Misc Use as directed   metFORMIN 1000 MG tablet Commonly known as: GLUCOPHAGE Take 1,000 mg by mouth 2 (two) times daily with a meal.   metoprolol succinate 25 MG 24 hr tablet Commonly known as: TOPROL-XL Take 25 mg by mouth daily.   multivitamin with minerals Tabs tablet Take 1 tablet by mouth daily.   omeprazole 20 MG capsule Commonly known as: PRILOSEC Take 1 capsule (20 mg total) by mouth daily.   OneTouch Verio Flex System w/Device Kit See admin instructions.   OneTouch Verio test strip Generic drug: glucose blood 2 (two) times daily. use for testing   pantoprazole 40 MG tablet Commonly known as: PROTONIX Take 1 tablet 2 times daily for 30 days, then 1 tablet once daily thereafter What changed:  how much to take how to take this when to take this additional instructions   sildenafil 20 MG tablet Commonly known as: REVATIO Take 3-5 tablets 1 hr prior to intercourse as needed        Allergies:  No Known Allergies  Family History: Family History  Problem Relation Age of Onset   Leukemia Mother    Colon cancer Father    Diabetes Father    Heart disease Father    Diabetes Brother     Social History:  reports that he has never smoked. He has never used smokeless tobacco. He reports current alcohol use. He reports that he does not use drugs.   Physical Exam: BP 125/81   Pulse 74   Ht _0  (1.803 m)    Wt 200 lb (90.7 kg)   BMI 27.89 kg/m   Constitutional:  Alert and oriented, No acute distress. HEENT: [place] AT, moist mucus membranes.  Trachea midline, no masses. Cardiovascular: No clubbing, cyanosis, or edema. Respiratory: Normal respiratory effort, no increased work of breathing. Skin: No rashes, bruises or suspicious lesions. Neurologic: Grossly intact, no focal deficits, moving all 4 extremities. Psychiatric: Normal mood and affect.  Laboratory Data:  Lab Results  Component Value Date   CREATININE 1.05 06/26/2022   Lab Results  Component Value Date   HGBA1C 11.4 (  H) 10/06/2021    Pertinent Imaging: Results for orders placed or performed in visit on 07/28/22  Bladder Scan (Post Void Residual) in office  Result Value Ref Range   Scan Result 14     Assessment & Plan:   BPH with history of urinary retention  - S/p HoLEP  - He continues improvement in urinary symptoms - Discussed new baseline PSA with him today, should be used for Future comparisons  2. ED  -We discussed the pathophysiology of erectile dysfunction today along with possible contributing factors. Discussed possible treatment options including PDE 5 inhibitors, vacuum erectile device, intracavernosal injection, MUSE, and placement of the inflatable or malleable penile prosthesis for refractory cases.  - In terms of PDE 5 inhibitors, we discussed contraindications for this medication as well as common side effects. Patient was counseled on optimal use. All of his questions were answered in detail.  - He is interested in a PDE5i; sildenafil prescribed    Return if symptoms worsen or fail to improve.  I,Kailey Littlejohn,acting as a Education administrator for Hollice Espy, MD.,have documented all relevant documentation on the behalf of Hollice Espy, MD,as directed by  Hollice Espy, MD while in the presence of Hollice Espy, MD.  I have reviewed the above documentation for accuracy and completeness, and I agree with the  above.   Hollice Espy, MD   [facility] [address], [place] [employer], [place] [address] [phone]

## 2022-08-13 ENCOUNTER — Other Ambulatory Visit: Payer: Self-pay | Admitting: *Deleted

## 2022-08-13 MED ORDER — SILDENAFIL CITRATE 20 MG PO TABS: ORAL_TABLET | ORAL | 12 refills | Status: AC

## 2022-09-25 ENCOUNTER — Inpatient Hospital Stay: Payer: BC Managed Care – PPO | Attending: Oncology

## 2022-09-25 ENCOUNTER — Ambulatory Visit: Admission: RE | Admit: 2022-09-25 | Payer: BC Managed Care – PPO | Source: Ambulatory Visit

## 2022-09-28 NOTE — Progress Notes (Deleted)
[facility]  [phone] [phone]  ID: [name]. 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  INTERVAL HISTORY: Patient returns to clinic today for repeat laboratory work, further evaluation, discussion of his imaging results.  Currently feels well and is at his baseline.  He feels significantly improved after discontinuing Imbruvica in November 2022.  He has no new neurologic complaints.  He denies any fevers.  He has a good appetite and denies weight loss.  He denies any chest pain, shortness of breath, cough, or hemoptysis.  He denies any nausea, vomiting, constipation, or diarrhea. He has no urinary complaints.  Patient offers no further specific complaints today.  REVIEW OF SYSTEMS:   Review of Systems  Constitutional: Negative.  Negative for diaphoresis, fever, malaise/fatigue and weight loss.  Respiratory: Negative.  Negative for cough and shortness of breath.   Cardiovascular: Negative.  Negative for chest pain and leg swelling.  Gastrointestinal:  Negative for abdominal pain, blood in stool, melena, nausea and vomiting.  Genitourinary: Negative.  Negative for dysuria.  Musculoskeletal: Negative.  Negative for back pain.  Skin: Negative.  Negative for rash.  Neurological:  Positive for focal weakness. Negative for dizziness, tingling, sensory change, weakness and headaches.  Psychiatric/Behavioral: Negative.  Negative for depression. The patient is not nervous/anxious and does not have insomnia.     As per HPI. Otherwise, a complete review of systems is negative.  PAST MEDICAL HISTORY: Past Medical History:  Diagnosis Date   Anxiety    Depression    Diabetes mellitus  without complication (HCC)    GERD (gastroesophageal reflux disease)    Leukemia (HCC)    chronic lymphocytic   Pneumonia     PAST SURGICAL HISTORY: Past Surgical History:  Procedure Laterality Date   COLONOSCOPY     ESOPHAGOGASTRODUODENOSCOPY (EGD) WITH PROPOFOL N/A 03/14/2018   Procedure: ESOPHAGOGASTRODUODENOSCOPY (EGD) WITH PROPOFOL;  Surgeon: [name], MD;  [facility] END[facility]COPY;  Service: Gastroenterology;  Laterality: N/A;   ESOPHAGOGASTRODUODENOSCOPY (EGD) WITH PROPOFOL N/A 06/13/2018   Procedure: ESOPHAGOGASTRODUODENOSCOPY (EGD) WITH PROPOFOL;  Surgeon: [name], MD;  Location[facility] ENDOSCOPY;  Service: Gastroenterology;  Laterality: N/A;   ESOPHAGOGASTRODUODENOSCOPY (EGD) WITH PROPOFOL N/A 10/09/2021   Procedure: ESOPHAGOGASTRODUODENOSCOPY (EGD) WITH PROPOFOL;  Surgeon: Virgel Manifold, MD;  Location: [facility] ENDOSCOPY;  Service: Endoscopy;  Laterality: N/A;   HOLEP-LASER ENUCLEATION OF THE PROSTATE WITH MORCELLATION N/A 12/08/2021   Procedure: HOLEP-LASER ENUCLEATION OF THE PROSTATE WITH MORCELLATION;  Surgeon: Hollice Espy, MD;  Location: [facility] ORS;  Service: Urology;  Laterality: N/A;   TEE WITHOUT CARDIOVERSION N/A 10/09/2021   Procedure: TRANSESOPHAGEAL ECHOCARDIOGRAM (TEE);  Surgeon: Virgel Manifold, MD;  Location: [facility] ENDOSCOPY;  Service: Endoscopy;  Laterality: N/A;   WISDOM TOOTH EXTRACTION      FAMILY HISTORY: Family History  Problem Relation Age of Onset   Leukemia Mother    Colon cancer Father    Diabetes Father    Heart disease Father    Diabetes Brother     ADVANCED DIRECTIVES (Y/N):  N  HEALTH MAINTENANCE: Social History   Tobacco Use   Smoking status: Never   Smokeless tobacco: Never  Vaping Use   Vaping Use: Never used  Substance Use Topics   Alcohol use: Yes  Comment: not at all   Drug use: No     Colonoscopy:  PAP:  Bone density:  Lipid panel:  No Known Allergies  Current Outpatient Medications  Medication Sig  Dispense Refill   acetaminophen (TYLENOL) 500 MG tablet Take 500-1,000 mg by mouth every 6 (six) hours as needed for mild pain.     Blood Glucose Monitoring Suppl ([place]) w/Device KIT See admin instructions.     cholecalciferol (VITAMIN D3) 25 MCG (1000 UNIT) tablet Take 1,000 Units by mouth daily.     citalopram (CELEXA) 40 MG tablet Take 40 mg by mouth daily.     ELIQUIS 5 MG TABS tablet Take 5 mg by mouth daily.     glipiZIDE (GLUCOTROL XL) 2.5 MG 24 hr tablet Take 2.5 mg by mouth daily.     insulin detemir (LEVEMIR) 100 UNIT/ML injection Inject 15 Units into the skin 2 (two) times daily.     Insulin Pen Needle 31G X 5 MM MISC Use as directed 60 each 2   Lancets (FREESTYLE) lancets Use as instructed 100 each 2   metFORMIN (GLUCOPHAGE) 1000 MG tablet Take 1,000 mg by mouth 2 (two) times daily with a meal.     metoprolol succinate (TOPROL-XL) 25 MG 24 hr tablet Take 25 mg by mouth daily.     Multiple Vitamin (MULTIVITAMIN WITH MINERALS) TABS tablet Take 1 tablet by mouth daily.     omeprazole (PRILOSEC) 20 MG capsule Take 1 capsule (20 mg total) by mouth daily. 90 capsule 3   ONETOUCH VERIO test strip 2 (two) times daily. use for testing     pantoprazole (PROTONIX) 40 MG tablet Take 1 tablet 2 times daily for 30 days, then 1 tablet once daily thereafter (Patient taking differently: Take 40 mg by mouth 2 (two) times daily.) 120 tablet 0   sildenafil (REVATIO) 20 MG tablet Take 3-5 tablets 1 hr prior to intercourse as needed 60 tablet 12   No current facility-administered medications for this visit.    OBJECTIVE: There were no vitals filed for this visit.    There is no height or weight on file to calculate BMI.    ECOG FS:1 - Symptomatic but completely ambulatory  General: Well-developed, well-nourished, no acute distress. Eyes: Pink conjunctiva, anicteric sclera. HEENT: Normocephalic, moist mucous membranes. Lungs: No audible wheezing or coughing. Heart: Regular  rate and rhythm. Abdomen: Soft, nontender, no obvious distention. Musculoskeletal: No edema, cyanosis, or clubbing. Neuro: Alert, answering all questions appropriately. Cranial nerves grossly intact. Skin: No rashes or petechiae noted. Psych: Normal affect.   LAB RESULTS:  Lab Results  Component Value Date   NA 136 06/26/2022   K 4.6 06/26/2022   CL 104 06/26/2022   CO2 27 06/26/2022   GLUCOSE 116 (H) 06/26/2022   BUN 19 06/26/2022   CREATININE 1.05 06/26/2022   CALCIUM 8.4 (L) 06/26/2022   PROT 5.9 (L) 06/26/2022   ALBUMIN 3.8 06/26/2022   AST 17 06/26/2022   ALT 18 06/26/2022   ALKPHOS 49 06/26/2022   BILITOT 0.9 06/26/2022   GFRNONAA >60 06/26/2022   GFRAA >60 07/05/2020    Lab Results  Component Value Date   WBC 6.6 06/26/2022   NEUTROABS 27.5 (H) 10/05/2021   HGB 14.5 06/26/2022   HCT 43.1 06/26/2022   MCV 83.0 06/26/2022   PLT 162 06/26/2022     STUDIES: No results found.  ASSESSMENT: CLL, now with right lower lobe lung mass.  PLAN:    Right lower  lobe lung mass: Resolving.  CT scan results from March 23, 2022 reviewed independently and reported as above with further decrease in size of lesion.  Previous PET scan revealed minimal SUV likely indicating a benign process.    CLL: Right axillary lymph node biopsy confirmed persistent disease although patient continues to remain asymptomatic.  His white blood cell count continues to be within normal limits.  PET scan results from December 03, 2021 reviewed independently with minimal hypermetabolic activity in lymph nodes.  Spleen has also reduced in size since 2017 where is reported 20 cm now only 15 cm.  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  Repeat CT scan on March 23, 2022 revealed minimal and stable lymphadenopathy.  Patient initiated Dolores Lory in approximately November 2017 with significant improvement of his B symptoms including his unusual neuropathy symptoms.  He discontinued  Imbruvica in November 2022.  Will not reinitiate treatment at this time.  If patient had recurrence of disease either via low enlarging lymph nodes or increasing white blood cell count, would reinitiate Imbruvica at that time.  Return to clinic in 3 months for laboratory work only and then in 6 months for laboratory work, repeat imaging, and further evaluation. Neuropathy, foot drop: Chronic and unchanged.  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  Patient was previously given a referral to Occupational Therapy to help improve balance. Difficulty swallowing: Patient does not complain of this today.  Appreciate GI input.  Patient had esophageal dilation on June 13, 2018. Hyperglycemia: Patient has improved blood glucose control.  Continue follow-up and treatment per primary care. Thrombocytopenia: Patient's platelet count has been within the normal range since November 2021. Bladder outlet obstruction secondary to BPH: Continue follow-up with urology as scheduled.  I spent a total of 30 minutes reviewing chart data, face-to-face evaluation with the patient, counseling and coordination of care as detailed above.  Patient expressed understanding and was in agreement with this plan. He also understands that He can call clinic at any time with any questions, concerns, or complaints.    Lloyd Huger, MD   09/28/2022 10:58 PM

## 2022-09-29 ENCOUNTER — Inpatient Hospital Stay: Payer: BC Managed Care – PPO | Admitting: Oncology

## 2022-10-08 NOTE — Progress Notes (Signed)
[place]  Telephone:([phone] [phone]  ID: [name]. 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 He complains of fatigue which is gradually worsening. Also having hot flashes and night sweats. He has held [place] since November 2022. No new neurologic complaints. No fevers or recent infections.   REVIEW OF SYSTEMS:   Review of Systems  Constitutional:  Positive for malaise/fatigue. Negative for chills, fever and weight loss.  HENT:  Negative for hearing loss, nosebleeds, sore throat and tinnitus.   Eyes:  Negative for blurred vision and double vision.  Respiratory:  Negative for cough, hemoptysis, shortness of breath and wheezing.   Cardiovascular:  Negative for chest pain, palpitations and leg swelling.  Gastrointestinal:  Negative for abdominal pain, blood in stool, constipation, diarrhea, melena, nausea and vomiting.  Genitourinary:  Negative for dysuria and urgency.  Musculoskeletal:  Negative for back pain, falls, joint pain and myalgias.  Skin:  Negative for itching and rash.  Neurological:  Positive for weakness. Negative for dizziness, tingling, sensory change, loss of consciousness and headaches.  Endo/Heme/Allergies:  Negative for environmental allergies. Does not bruise/bleed easily.  Psychiatric/Behavioral:  Negative for depression. The patient is not nervous/anxious and does not have insomnia.   As per HPI. Otherwise, a complete review of systems is negative.  PAST MEDICAL HISTORY: Past Medical History:  Diagnosis Date   Anxiety    Depression     Diabetes mellitus without complication (HCC)    GERD (gastroesophageal reflux disease)    Leukemia (HCC)    chronic lymphocytic   Pneumonia     PAST SURGICAL HISTORY: Past Surgical History:  Procedure Laterality Date   COLONOSCOPY     ESOPHAGOGASTRODUODENOSCOPY (EGD) WITH PROPOFOL N/A 03/14/2018   Procedure: ESOPHAGOGASTRODUODENOSCOPY (EGD) WITH PROPOFOL;  Surgeon: [name], MD;  Location: [facility] ENDOSCOPY;  Service: Gastroenterology;  Laterality: N/A;   ESOPHAGOGASTRODUODENOSCOPY (EGD) WITH PROPOFOL N/A 06/13/2018   Procedure: ESOPHAGOGASTRODUODENOSCOPY (EGD) WITH PROPOFOL;  Surgeon: [name], MD;  Location: [facility] ENDOSCOPY;  Service: Gastroenterology;  Laterality: N/A;   ESOPHAGOGASTRODUODENOSCOPY (EGD) WITH PROPOFOL N/A 10/09/2021   Procedure: ESOPHAGOGASTRODUODENOSCOPY (EGD) WITH PROPOFOL;  Surgeon: Virgel Manifold, MD;  Location: [facility] ENDOSCOPY;  Service: Endoscopy;  Laterality: N/A;   HOLEP-LASER ENUCLEATION OF THE PROSTATE WITH MORCELLATION N/A 12/08/2021   Procedure: HOLEP-LASER ENUCLEATION OF THE PROSTATE WITH MORCELLATION;  Surgeon: Hollice Espy, MD;  Location: [facility] ORS;  Service: Urology;  Laterality: N/A;   TEE WITHOUT CARDIOVERSION N/A 10/09/2021   Procedure: TRANSESOPHAGEAL ECHOCARDIOGRAM (TEE);  Surgeon: Virgel Manifold, MD;  Location: [facility] ENDOSCOPY;  Service: Endoscopy;  Laterality: N/A;   WISDOM TOOTH EXTRACTION      FAMILY HISTORY: Family History  Problem Relation Age of Onset   Leukemia Mother    Colon cancer Father    Diabetes Father    Heart disease Father    Diabetes Brother     ADVANCED DIRECTIVES (Y/N):  N  HEALTH MAINTENANCE: Social History   Tobacco Use   Smoking status: Never   Smokeless tobacco: Never  Vaping Use   Vaping Use: Never used  Substance Use Topics   Alcohol use: Yes    Comment:  not at all   Drug use: No    Colonoscopy:  PAP:  Bone density:  Lipid panel:  No Known Allergies  Current Outpatient Medications   Medication Sig Dispense Refill   acetaminophen (TYLENOL) 500 MG tablet Take 500-1,000 mg by mouth every 6 (six) hours as needed for mild pain.     Blood Glucose Monitoring Suppl ([place]) w/Device KIT See admin instructions.     cholecalciferol (VITAMIN D3) 25 MCG (1000 UNIT) tablet Take 1,000 Units by mouth daily.     citalopram (CELEXA) 40 MG tablet Take 40 mg by mouth daily.     ELIQUIS 5 MG TABS tablet Take 5 mg by mouth daily.     insulin detemir (LEVEMIR) 100 UNIT/ML injection Inject 15 Units into the skin 2 (two) times daily.     Insulin Pen Needle 31G X 5 MM MISC Use as directed 60 each 2   Lancets (FREESTYLE) lancets Use as instructed 100 each 2   metFORMIN (GLUCOPHAGE) 1000 MG tablet Take 1,000 mg by mouth 2 (two) times daily with a meal.     metoprolol succinate (TOPROL-XL) 25 MG 24 hr tablet Take 25 mg by mouth daily.     Multiple Vitamin (MULTIVITAMIN WITH MINERALS) TABS tablet Take 1 tablet by mouth daily.     omeprazole (PRILOSEC) 20 MG capsule Take 1 capsule (20 mg total) by mouth daily. 90 capsule 3   ONETOUCH VERIO test strip 2 (two) times daily. use for testing     sildenafil (REVATIO) 20 MG tablet Take 3-5 tablets 1 hr prior to intercourse as needed 60 tablet 12   ibrutinib (IMBRUVICA) 420 MG tablet Take 1 tablet (420 mg total) by mouth daily. Take with a glass of water. 28 tablet 2   pantoprazole (PROTONIX) 40 MG tablet Take 1 tablet 2 times daily for 30 days, then 1 tablet once daily thereafter (Patient not taking: Reported on 10/15/2022) 120 tablet 0   No current facility-administered medications for this visit.    OBJECTIVE: Vitals:   10/15/22 1011  BP: (!) 127/90  Pulse: 78  Resp: 16  Temp: (!) 96.8 F (36 C)  SpO2: 99%     Body mass index is 28.42 kg/m.    ECOG FS:2 - Symptomatic, <50% confined to bed  General: Well-developed, well-nourished, no acute distress. Eyes: Pink conjunctiva, anicteric sclera. Lungs: Clear to auscultation  bilaterally.  No audible wheezing or coughing Heart: Regular rate and rhythm.  Abdomen: Soft, nontender, nondistended.  Musculoskeletal: No edema, cyanosis, or clubbing. Neuro: Alert, answering all questions appropriately. Cranial nerves grossly intact. Skin: No rashes or petechiae noted. Pale Psych: Normal affect. Fatigued appearing.   LAB RESULTS: Lab Results  Component Value Date   NA 136 06/26/2022   K 4.6 06/26/2022   CL 104 06/26/2022   CO2 27 06/26/2022   GLUCOSE 116 (H) 06/26/2022   BUN 19 06/26/2022   CREATININE 1.05 06/26/2022   CALCIUM 8.4 (L) 06/26/2022   PROT 5.9 (L) 06/26/2022   ALBUMIN 3.8 06/26/2022   AST 17 06/26/2022   ALT 18 06/26/2022   ALKPHOS 49 06/26/2022   BILITOT 0.9 06/26/2022   GFRNONAA >60 06/26/2022   GFRAA >60 07/05/2020    Lab Results  Component Value Date   WBC 7.2 10/15/2022   NEUTROABS 27.5 (H) 10/05/2021   HGB 15.2 10/15/2022   HCT 44.8 10/15/2022   MCV 83.9 10/15/2022   PLT 192 10/15/2022     STUDIES: CT CHEST ABDOMEN PELVIS W  CONTRAST  Result Date: 10/14/2022 CLINICAL DATA:  History of leukemia, follow-up. * Tracking Code: BO * EXAM: CT CHEST, ABDOMEN, AND PELVIS WITH CONTRAST TECHNIQUE: Multidetector CT imaging of the chest, abdomen and pelvis was performed following the standard protocol during bolus administration of intravenous contrast. RADIATION DOSE REDUCTION: This exam was performed according to the departmental dose-optimization program which includes automated exposure control, adjustment of the mA and/or kV according to patient size and/or use of iterative reconstruction technique. CONTRAST:  132m OMNIPAQUE IOHEXOL 300 MG/ML  SOLN COMPARISON:  Multiple priors including most recent CT chest abdomen pelvis dated March 23, 2022. FINDINGS: CT CHEST FINDINGS Cardiovascular: Ascending thoracic aorta measures 4.8 cm, unchanged. Aortic atherosclerosis. No central pulmonary embolus on this nondedicated study. Normal size heart.  Coronary artery calcifications. No significant pericardial effusion/thickening. Mediastinum/Nodes: No pathologically enlarged mediastinal or hilar lymph nodes. Slight interval increase in size of the bilateral axillary lymph nodes. For reference: -largest right axillary lymph node measures 13 mm in short axis on image 16/3, previously 12 mm. -largest left axillary lymph node measures 10 mm in short axis on image 15/3 previously 8 mm. Small hiatal hernia. Lungs/Pleura: Stable bilateral pulmonary nodules. Previously indexed right upper lobe pulmonary nodule measures 5 mm on image 47/6, unchanged. No new suspicious pulmonary nodules or masses no pleural effusion. No pneumothorax. Musculoskeletal: No aggressive lytic or blastic lesion of bone. CT ABDOMEN PELVIS FINDINGS Hepatobiliary: No suspicious hepatic lesion. Gallbladder is unremarkable. No biliary ductal dilation. Pancreas: No pancreatic ductal dilation or evidence of acute inflammation. Spleen: No splenomegaly. Adrenals/Urinary Tract: Bilateral adrenal glands appear normal. No hydronephrosis. Kidneys demonstrate symmetric enhancement and excretion of contrast material. Urinary bladder is unremarkable for degree of distension. Stomach/Bowel: Small hiatal hernia. Stomach is otherwise unremarkable for degree of distension. No pathologic dilation of small or large bowel. Small bowel small bowel intussusception in the left upper quadrant, commonly transient in the absence of symptoms requires no independent imaging follow-up. Colonic diverticulosis without findings of acute diverticulitis. Vascular/Lymphatic: Aortic atherosclerosis. No significant interval change in the abdominopelvic lymph nodes. Previously indexed lymph nodes are as follows: -aortocaval lymph node measures 9 mm in short axis on image 78/3, unchanged. -left common iliac lymph node measures 8 mm in short axis on image 97/3 previously 7 mm. -left external iliac lymph node measures 10 mm in short axis  on image 124/3 previously 11 mm. Reproductive: TURP defect in the prostate gland. Other: No significant abdominopelvic free fluid. Small umbilical hernia contains fat and nonobstructed portion of small bowel. Musculoskeletal: No aggressive lytic or blastic lesion of bone. Multilevel degenerative changes spine. Degenerative change of the hips. IMPRESSION: 1. Slight interval increase in size of the bilateral axillary lymph nodes. 2. No significant interval change in the abdominopelvic lymph nodes. 3. No splenomegaly. 4. Stable small bilateral pulmonary nodules. 5. Colonic diverticulosis without findings of acute diverticulitis. 6. Small umbilical hernia contains fat and nonobstructed portion of small bowel. 7. Unchanged 4.8 cm ascending thoracic aortic aneurysm. Ascending thoracic aortic aneurysm. Recommend semi-annual imaging followup by CTA or MRA and referral to cardiothoracic surgery if not already obtained. This recommendation follows 2010 ACCF/AHA/AATS/ACR/ASA/SCA/SCAI/SIR/STS/SVM Guidelines for the Diagnosis and Management of Patients With Thoracic Aortic Disease. Circulation. 2010; 121:: Z366-Y403 Aortic aneurysm NOS (ICD10-I71.9) 8.  Aortic Atherosclerosis (ICD10-I70.0). Electronically Signed   By: JDahlia BailiffM.D.   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  PLAN:    CLL: Bone marrow biopsy in December 2017 revealed  CLL with 13 q- cytogenetics which as sole anomoly is typically good prognostic indicator. Right axillary lymph node biopsy confirmed persistent disease. Patient initiated Dolores Lory in approximately November 2017 with significant improvement of his B symptoms including his unusual neuropathy symptoms (see below).  He discontinued Imbruvica in November 2022. PET scan results from December 03, 2021 showed minimal hypermetabolic activity in lymph nodes.  Spleen has also reduced in size since 2017 where is reported 20 cm now only 15 cm.  Repeat CT scan on  March 23, 2022 revealed minimal and stable lymphadenopathy. Imaging from 10/13/22 shows mild enlargement of axillary lymph nodes. Previously 12 mm, now 13 on right; left was previously 8 mm, now 4m. Offered continued observation, however, patient has increasing symptoms including worsening fatigue, night sweats, and hot flashes. WBC remains normal however. He will restart ibruvica 420 mg daily. Refill send today. He has some on hand and can start that [facility] Plan to see him back in 1 month for follow up. Reviewed with Dr. FGrayland Ormond   Neuropathy, foot drop: Chronic and unchanged.  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  Patient was previously given a referral to Occupational Therapy to help improve balance. Neuropathic symptoms improved off imbruvica however.   Difficulty swallowing: Patient does not complain of this today.  Appreciate GI input.  Patient had esophageal dilation on June 13, 2018.  Hyperglycemia: Patient has improved blood glucose control.  Continue follow-up and treatment per primary care.  Thrombocytopenia: Patient's platelet count has been within the normal range since November 2021.  Bladder outlet obstruction secondary to BPH: Continue follow-up with urology as scheduled.  Right lower lobe lung mass: Resolving. CT scan results from March 23, 2022 reviewed independently and reported as above with further decrease in size of lesion.  Previous PET scan revealed minimal SUV likely indicating a benign process.  incidental on 10/13/22 imaging. Measures 5 mm. Stable. Monitor.   Disposition:  1 mo- see [facility] with labs- follow up for restart of imbruvica- la  I spent a total of 30 minutes reviewing chart data, face-to-face evaluation with the patient, counseling and coordination of care as detailed above.  Patient expressed understanding and was in agreement with this plan. He also understands that He  can call clinic at any time with any questions, concerns, or complaints.   LVerlon Au NP   10/15/2022

## 2022-10-13 ENCOUNTER — Ambulatory Visit
Admission: RE | Admit: 2022-10-13 | Discharge: 2022-10-13 | Disposition: A | Discharge status: Home / Self Care | Payer: BC Managed Care – PPO | Source: Ambulatory Visit | Attending: Oncology | Admitting: Oncology

## 2022-10-13 DIAGNOSIS — R918 Other nonspecific abnormal finding of lung field: Secondary | ICD-10-CM | POA: Diagnosis present

## 2022-10-13 MED ORDER — IOHEXOL 300 MG/ML  SOLN
100.0000 mL | Freq: Once | INTRAMUSCULAR | Status: AC | PRN
  Administered 2022-10-13: 100 mL via INTRAVENOUS

## 2022-10-15 ENCOUNTER — Inpatient Hospital Stay: Payer: BC Managed Care – PPO

## 2022-10-15 ENCOUNTER — Encounter: Payer: Self-pay | Admitting: Oncology

## 2022-10-15 ENCOUNTER — Telehealth: Payer: Self-pay

## 2022-10-15 ENCOUNTER — Other Ambulatory Visit (HOSPITAL_COMMUNITY): Payer: Self-pay

## 2022-10-15 ENCOUNTER — Inpatient Hospital Stay: Payer: BC Managed Care – PPO | Attending: Oncology | Admitting: Nurse Practitioner

## 2022-10-15 ENCOUNTER — Telehealth: Payer: Self-pay | Admitting: Pharmacist

## 2022-10-15 VITALS — BP 127/90 | HR 78 | Temp 96.8°F | Resp 16 | Ht 71.0 in | Wt 203.8 lb

## 2022-10-15 DIAGNOSIS — C9110 Chronic lymphocytic leukemia of B-cell type not having achieved remission: Secondary | ICD-10-CM

## 2022-10-15 DIAGNOSIS — D696 Thrombocytopenia, unspecified: Secondary | ICD-10-CM | POA: Diagnosis not present

## 2022-10-15 DIAGNOSIS — R918 Other nonspecific abnormal finding of lung field: Secondary | ICD-10-CM | POA: Diagnosis not present

## 2022-10-15 DIAGNOSIS — Z800 Family history of malignant neoplasm of digestive organs: Secondary | ICD-10-CM | POA: Diagnosis not present

## 2022-10-15 DIAGNOSIS — E1140 Type 2 diabetes mellitus with diabetic neuropathy, unspecified: Secondary | ICD-10-CM | POA: Insufficient documentation

## 2022-10-15 DIAGNOSIS — N401 Enlarged prostate with lower urinary tract symptoms: Secondary | ICD-10-CM | POA: Insufficient documentation

## 2022-10-15 DIAGNOSIS — Z806 Family history of leukemia: Secondary | ICD-10-CM | POA: Insufficient documentation

## 2022-10-15 DIAGNOSIS — E1165 Type 2 diabetes mellitus with hyperglycemia: Secondary | ICD-10-CM | POA: Diagnosis not present

## 2022-10-15 LAB — CBC
HCT: 44.8 % (ref 39.0–52.0)
Hemoglobin: 15.2 g/dL (ref 13.0–17.0)
MCH: 28.5 pg (ref 26.0–34.0)
MCHC: 33.9 g/dL (ref 30.0–36.0)
MCV: 83.9 fL (ref 80.0–100.0)
Platelets: 192 10*3/uL (ref 150–400)
RBC: 5.34 MIL/uL (ref 4.22–5.81)
RDW: 12.6 % (ref 11.5–15.5)
WBC: 7.2 10*3/uL (ref 4.0–10.5)
nRBC: 0 % (ref 0.0–0.2)

## 2022-10-15 MED ORDER — IBRUTINIB 420 MG PO TABS
420.0000 mg | ORAL_TABLET | Freq: Every day | ORAL | 2 refills | Status: DC
  Filled 2022-10-15: qty 28, 28d supply, fill #0

## 2022-10-15 MED ORDER — IBRUTINIB 420 MG PO TABS
420.0000 mg | ORAL_TABLET | Freq: Every day | ORAL | 2 refills | Status: DC
  Filled 2022-10-15 – 2022-10-16 (×2): qty 28, 28d supply, fill #0
  Filled 2022-11-05: qty 28, 28d supply, fill #1
  Filled 2022-12-04 – 2022-12-09 (×2): qty 28, 28d supply, fill #2

## 2022-10-15 NOTE — Telephone Encounter (Addendum)
Oral Oncology Pharmacist Encounter  Received restart prescription for Imbruvica (ibrutinib) for the treatment of CLL, planned duration until disease progression or unacceptable drug toxicity. Patient was recently on a treatment break but now needs to resume treatment.  Patient does not have a history of hepatic dysfunction, CMP will be rechecked at patient's next office visit. Prescription dose and frequency assessed.   Current medication list in Epic reviewed, a few DDIs with ibrutinib identified: Citalopram and apixaban:  Ibrutinib may enhance the adverse/toxic effect of agents with antiplatelet properties, such as citalopram and apixaban. Monitor for signs and symptoms of bleeding  Evaluated chart and no patient barriers to medication adherence identified.   Prescription has been e-scribed to the [facility] for benefits analysis and approval.   Darl Pikes, PharmD, BCPS, BCOP, CPP Hematology/Oncology Clinical Pharmacist Practitioner [place]/DB/AP Oral [facility] [phone]  10/15/2022 1:45 PM

## 2022-10-15 NOTE — Telephone Encounter (Signed)
Oral Oncology Patient Advocate Encounter  Prior Authorization for Kate [facility] has been approved.    PA# [id number]  Effective dates: 10.19.23 through 10.19.24  Patients co-pay is $250.    Berdine Addison, [facility] Patient [place]  [phone] (phone) [phone] (fax) 10/15/2022 2:52 PM

## 2022-10-15 NOTE — Telephone Encounter (Signed)
Called informed patient Ander Purpura, NP sent to pharmacy and to start taking imbruvica daily. Home delivery will be set up and they will reach out next week. Patient agrees and verbalizes understanding.

## 2022-10-15 NOTE — Telephone Encounter (Signed)
Oral Oncology Patient Advocate Encounter   Received notification that prior authorization for Imbruvica is required.   PA submitted on 10.19.23  Key [id number]  Status is pending     Berdine Addison, [place] Patient [place]  [phone] (phone) [phone] (fax) 10/15/2022 1:52 PM

## 2022-10-16 ENCOUNTER — Other Ambulatory Visit (HOSPITAL_COMMUNITY): Payer: Self-pay

## 2022-10-20 LAB — POCT I-STAT CREATININE: Creatinine, Ser: 1.2 mg/dL (ref 0.61–1.24)

## 2022-11-05 ENCOUNTER — Other Ambulatory Visit (HOSPITAL_COMMUNITY): Payer: Self-pay

## 2022-11-12 ENCOUNTER — Other Ambulatory Visit (HOSPITAL_COMMUNITY): Payer: Self-pay

## 2022-11-16 ENCOUNTER — Inpatient Hospital Stay: Payer: BC Managed Care – PPO | Admitting: Pharmacist

## 2022-11-16 ENCOUNTER — Encounter: Payer: Self-pay | Admitting: Oncology

## 2022-11-16 ENCOUNTER — Inpatient Hospital Stay: Payer: BC Managed Care – PPO | Attending: Oncology

## 2022-11-16 ENCOUNTER — Inpatient Hospital Stay: Payer: BC Managed Care – PPO | Admitting: Oncology

## 2022-11-16 VITALS — BP 126/86 | HR 71 | Temp 97.8°F | Resp 16 | Ht 71.0 in | Wt 202.0 lb

## 2022-11-16 DIAGNOSIS — C9110 Chronic lymphocytic leukemia of B-cell type not having achieved remission: Secondary | ICD-10-CM

## 2022-11-16 DIAGNOSIS — Z800 Family history of malignant neoplasm of digestive organs: Secondary | ICD-10-CM | POA: Diagnosis not present

## 2022-11-16 DIAGNOSIS — N401 Enlarged prostate with lower urinary tract symptoms: Secondary | ICD-10-CM | POA: Diagnosis not present

## 2022-11-16 DIAGNOSIS — E1140 Type 2 diabetes mellitus with diabetic neuropathy, unspecified: Secondary | ICD-10-CM | POA: Insufficient documentation

## 2022-11-16 DIAGNOSIS — R911 Solitary pulmonary nodule: Secondary | ICD-10-CM | POA: Insufficient documentation

## 2022-11-16 DIAGNOSIS — D696 Thrombocytopenia, unspecified: Secondary | ICD-10-CM | POA: Insufficient documentation

## 2022-11-16 LAB — COMPREHENSIVE METABOLIC PANEL
ALT: 13 U/L (ref 0–44)
AST: 16 U/L (ref 15–41)
Albumin: 3.8 g/dL (ref 3.5–5.0)
Alkaline Phosphatase: 51 U/L (ref 38–126)
Anion gap: 4 — ABNORMAL LOW (ref 5–15)
BUN: 23 mg/dL (ref 8–23)
CO2: 26 mmol/L (ref 22–32)
Calcium: 8.8 mg/dL — ABNORMAL LOW (ref 8.9–10.3)
Chloride: 105 mmol/L (ref 98–111)
Creatinine, Ser: 1.36 mg/dL — ABNORMAL HIGH (ref 0.61–1.24)
GFR, Estimated: 56 mL/min — ABNORMAL LOW (ref >60)
Glucose, Bld: 93 mg/dL (ref 70–99)
Potassium: 4.5 mmol/L (ref 3.5–5.1)
Sodium: 135 mmol/L (ref 135–145)
Total Bilirubin: 0.7 mg/dL (ref 0.3–1.2)
Total Protein: 6.5 g/dL (ref 6.5–8.1)

## 2022-11-16 LAB — CBC
HCT: 45.1 % (ref 39.0–52.0)
Hemoglobin: 15.1 g/dL (ref 13.0–17.0)
MCH: 28.1 pg (ref 26.0–34.0)
MCHC: 33.5 g/dL (ref 30.0–36.0)
MCV: 83.8 fL (ref 80.0–100.0)
Platelets: 190 10*3/uL (ref 150–400)
RBC: 5.38 MIL/uL (ref 4.22–5.81)
RDW: 12.7 % (ref 11.5–15.5)
WBC: 12.1 10*3/uL — ABNORMAL HIGH (ref 4.0–10.5)
nRBC: 0 % (ref 0.0–0.2)

## 2022-11-16 NOTE — Progress Notes (Signed)
[place]  [phone] [phone]  Patient Care Team: Adin Hector, MD as PCP - General (Internal Medicine) Lucilla Lame, MD as Consulting Physician (Gastroenterology) Anabel Bene, MD as Referring Physician (Neurology)   Name of the patient: Travis [name]  [id number]  [birth year]   Date of visit: 11/16/22  HPI: Patient is a 71 y.o. male with CLL. Patient was previously on a ibrutinib treatment break but resumed therapy on 10/15/22.  Reason for Consult: Oral chemotherapy follow-up for ibrutinib therapy.   PAST MEDICAL HISTORY: Past Medical History:  Diagnosis Date   Anxiety    Depression    Diabetes mellitus without complication (HCC)    GERD (gastroesophageal reflux disease)    Leukemia (HCC)    chronic lymphocytic   Pneumonia     HEMATOLOGY/ONCOLOGY HISTORY:  Oncology History   No history exists.    ALLERGIES:  has No Known Allergies.  MEDICATIONS:  Current Outpatient Medications  Medication Sig Dispense Refill   acetaminophen (TYLENOL) 500 MG tablet Take 500-1,000 mg by mouth every 6 (six) hours as needed for mild pain.     Blood Glucose Monitoring Suppl ([place]) w/Device KIT See admin instructions.     cholecalciferol (VITAMIN D3) 25 MCG (1000 UNIT) tablet Take 1,000 Units by mouth daily.     citalopram (CELEXA) 40 MG tablet Take 40 mg by mouth daily.     ELIQUIS 5 MG TABS tablet Take 5 mg by mouth daily.     ibrutinib (IMBRUVICA) 420 MG tablet Take 1 tablet (420 mg total) by mouth daily. Take with a glass of water. 28 tablet 2   insulin detemir (LEVEMIR) 100 UNIT/ML injection Inject 15 Units into the skin 2 (two) times daily.     Insulin Pen Needle 31G X 5 MM MISC Use as directed 60 each 2   Lancets (FREESTYLE) lancets Use as instructed 100 each 2   metFORMIN (GLUCOPHAGE) 1000 MG tablet Take 1,000 mg by mouth 2 (two) times daily with a meal.     metoprolol succinate  (TOPROL-XL) 25 MG 24 hr tablet Take 25 mg by mouth daily.     Multiple Vitamin (MULTIVITAMIN WITH MINERALS) TABS tablet Take 1 tablet by mouth daily.     omeprazole (PRILOSEC) 20 MG capsule Take 1 capsule (20 mg total) by mouth daily. 90 capsule 3   ONETOUCH VERIO test strip 2 (two) times daily. use for testing     pantoprazole (PROTONIX) 40 MG tablet Take 1 tablet 2 times daily for 30 days, then 1 tablet once daily thereafter (Patient not taking: Reported on 10/15/2022) 120 tablet 0   sildenafil (REVATIO) 20 MG tablet Take 3-5 tablets 1 hr prior to intercourse as needed 60 tablet 12   No current facility-administered medications for this visit.    VITAL SIGNS: There were no vitals taken for this visit. There were no vitals filed for this visit.  Estimated body mass index is 28.17 kg/m as calculated from the following:   Height as of an earlier encounter on 11/16/22: _0  (1.803 m).   Weight as of an earlier encounter on 11/16/22: 91.6 kg (202 lb).  LABS: CBC:    Component Value Date/Time   WBC 12.1 (H) 11/16/2022 1021   HGB 15.1 11/16/2022 1021   HCT 45.1 11/16/2022 1021   PLT 190 11/16/2022 1021   MCV 83.8 11/16/2022 1021   NEUTROABS 27.5 (H) 10/05/2021 1252   LYMPHSABS 1.0 10/05/2021 1252  MONOABS 2.0 (H) 10/05/2021 1252   EOSABS 0.0 10/05/2021 1252   BASOSABS 0.0 10/05/2021 1252   Comprehensive Metabolic Panel:    Component Value Date/Time   NA 135 11/16/2022 1021   K 4.5 11/16/2022 1021   CL 105 11/16/2022 1021   CO2 26 11/16/2022 1021   BUN 23 11/16/2022 1021   CREATININE 1.36 (H) 11/16/2022 1021   GLUCOSE 93 11/16/2022 1021   CALCIUM 8.8 (L) 11/16/2022 1021   AST 16 11/16/2022 1021   ALT 13 11/16/2022 1021   ALKPHOS 51 11/16/2022 1021   BILITOT 0.7 11/16/2022 1021   PROT 6.5 11/16/2022 1021   ALBUMIN 3.8 11/16/2022 1021     Present during today's visit: patient only  Assessment and Plan: CBC and CMP reviewed Slight increase in SCr. will monitor  WBC  increased liekly related to restarting therapy, will monitor Continue ibrutinib 48m daily Patient reports a decrease in night sweat frequency    Oral Chemotherapy Side Effect/Intolerance:  Nausea: he has been nausea with some vomiting for the last two days. He reports having antiemetics but does not want to take them. Encouraged patient to use them as needed. He reports remaining hydrated  No reported rash and bowel movements are unchanged  Oral Chemotherapy Adherence: No missed doses reported No patient barriers to medication adherence identified.   New medications: None reported  Medication Access Issues: No issues[facility]  Patient expressed understanding and was in agreement with this plan. He also understands that He can call clinic at any time with any questions, concerns, or complaints.   Follow-up plan: RTC in one month  Thank you for allowing me to participate in the care of this very pleasant patient.   Time Total: 15 mins  Visit consisted of counseling and education on dealing with issues of symptom management in the setting of serious and potentially life-threatening illness.Greater than 50%  of this time was spent counseling and coordinating care related to the above assessment and plan.  Signed by: ADarl Pikes PharmD, BCPS, BSalley Slaughter CPP Hematology/Oncology Clinical Pharmacist Practitioner /DB/AP Oral [phone] 11/16/2022 4:26 PM

## 2022-11-16 NOTE — Progress Notes (Signed)
[place]  Telephone:([phone] [phone]  ID: Travis [name]. 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  INTERVAL HISTORY: Patient returns to clinic today for repeat laboratory work and further evaluation.  He recently reinitiated Imbruvica and is tolerating treatments well.  He has no new neurologic complaints.  He denies any fevers.  He has a good appetite and denies weight loss.  He denies any chest pain, shortness of breath, cough, or hemoptysis.  He denies any nausea, vomiting, constipation, or diarrhea. He has no urinary complaints.  Patient offers no further specific complaints today.  REVIEW OF SYSTEMS:   Review of Systems  Constitutional: Negative.  Negative for diaphoresis, fever, malaise/fatigue and weight loss.  Respiratory: Negative.  Negative for cough and shortness of breath.   Cardiovascular: Negative.  Negative for chest pain and leg swelling.  Gastrointestinal:  Negative for abdominal pain, blood in stool, melena, nausea and vomiting.  Genitourinary: Negative.  Negative for dysuria.  Musculoskeletal: Negative.  Negative for back pain.  Skin: Negative.  Negative for rash.  Neurological:  Positive for focal weakness. Negative for dizziness, tingling, sensory change, weakness and headaches.  Psychiatric/Behavioral: Negative.  Negative for depression. The patient is not nervous/anxious and does not have insomnia.     As per HPI. Otherwise, a complete review of systems is negative.  PAST MEDICAL HISTORY: Past Medical History:  Diagnosis Date   Anxiety    Depression    Diabetes mellitus without complication (HCC)    GERD (gastroesophageal reflux disease)    Leukemia (HCC)     chronic lymphocytic   Pneumonia     PAST SURGICAL HISTORY: Past Surgical History:  Procedure Laterality Date   COLONOSCOPY     ESOPHAGOGASTRODUODENOSCOPY (EGD) WITH PROPOFOL N/A 03/14/2018   Procedure: ESOPHAGOGASTRODUODENOSCOPY (EGD) WITH PROPOFOL;  Surgeon: [name], MD;  Location: [facility] ENDOSCOPY;  Service: Gastroenterology;  Laterality: N/A;   ESOPHAGOGASTRODUODENOSCOPY (EGD) WITH PROPOFOL N/A 06/13/2018   Procedure: ESOPHAGOGASTRODUODENOSCOPY (EGD) WITH PROPOFOL;  Surgeon: [name], MD;  Location: [facility] ENDOSCOPY;  Service: Gastroenterology;  Laterality: N/A;   ESOPHAGOGASTRODUODENOSCOPY (EGD) WITH PROPOFOL N/A 10/09/2021   Procedure: ESOPHAGOGASTRODUODENOSCOPY (EGD) WITH PROPOFOL;  Surgeon: Virgel Manifold, MD;  Location: [facility] ENDOSCOPY;  Service: Endoscopy;  Laterality: N/A;   HOLEP-LASER ENUCLEATION OF THE PROSTATE WITH MORCELLATION N/A 12/08/2021   Procedure: HOLEP-LASER ENUCLEATION OF THE PROSTATE WITH MORCELLATION;  Surgeon: Hollice Espy, MD;  Location: [facility] ORS;  Service: Urology;  Laterality: N/A;   TEE WITHOUT CARDIOVERSION N/A 10/09/2021   Procedure: TRANSESOPHAGEAL ECHOCARDIOGRAM (TEE);  Surgeon: Virgel Manifold, MD;  Location: [facility] ENDOSCOPY;  Service: Endoscopy;  Laterality: N/A;   WISDOM TOOTH EXTRACTION      FAMILY HISTORY: Family History  Problem Relation Age of Onset   Leukemia Mother    Colon cancer Father    Diabetes Father    Heart disease Father    Diabetes Brother     ADVANCED DIRECTIVES (Y/N):  N  HEALTH MAINTENANCE: Social History   Tobacco Use   Smoking status: Never   Smokeless tobacco: Never  Vaping Use   Vaping Use: Never used  Substance Use Topics   Alcohol use: Yes    Comment: not at all   Drug use: No  Colonoscopy:  PAP:  Bone density:  Lipid panel:  No Known Allergies  Current Outpatient Medications  Medication Sig Dispense Refill   acetaminophen (TYLENOL) 500 MG tablet Take 500-1,000 mg by mouth every 6  (six) hours as needed for mild pain.     Blood Glucose Monitoring Suppl ([place]) w/Device KIT See admin instructions.     cholecalciferol (VITAMIN D3) 25 MCG (1000 UNIT) tablet Take 1,000 Units by mouth daily.     citalopram (CELEXA) 40 MG tablet Take 40 mg by mouth daily.     ELIQUIS 5 MG TABS tablet Take 5 mg by mouth daily.     ibrutinib (IMBRUVICA) 420 MG tablet Take 1 tablet (420 mg total) by mouth daily. Take with a glass of water. 28 tablet 2   insulin detemir (LEVEMIR) 100 UNIT/ML injection Inject 15 Units into the skin 2 (two) times daily.     Insulin Pen Needle 31G X 5 MM MISC Use as directed 60 each 2   Lancets (FREESTYLE) lancets Use as instructed 100 each 2   metFORMIN (GLUCOPHAGE) 1000 MG tablet Take 1,000 mg by mouth 2 (two) times daily with a meal.     metoprolol succinate (TOPROL-XL) 25 MG 24 hr tablet Take 25 mg by mouth daily.     Multiple Vitamin (MULTIVITAMIN WITH MINERALS) TABS tablet Take 1 tablet by mouth daily.     omeprazole (PRILOSEC) 20 MG capsule Take 1 capsule (20 mg total) by mouth daily. 90 capsule 3   ONETOUCH VERIO test strip 2 (two) times daily. use for testing     sildenafil (REVATIO) 20 MG tablet Take 3-5 tablets 1 hr prior to intercourse as needed 60 tablet 12   pantoprazole (PROTONIX) 40 MG tablet Take 1 tablet 2 times daily for 30 days, then 1 tablet once daily thereafter (Patient not taking: Reported on 10/15/2022) 120 tablet 0   No current facility-administered medications for this visit.    OBJECTIVE: Vitals:   11/16/22 1037  BP: 126/86  Pulse: 71  Resp: 16  Temp: 97.8 F (36.6 C)  SpO2: 100%     Body mass index is 28.17 kg/m.    ECOG FS:1 - Symptomatic but completely ambulatory  General: Well-developed, well-nourished, no acute distress. Eyes: Pink conjunctiva, anicteric sclera. HEENT: Normocephalic, moist mucous membranes. Lungs: No audible wheezing or coughing. Heart: Regular rate and rhythm. Abdomen: Soft,  nontender, no obvious distention. Musculoskeletal: No edema, cyanosis, or clubbing. Neuro: Alert, answering all questions appropriately. Cranial nerves grossly intact. Skin: No rashes or petechiae noted. Psych: Normal affect.  LAB RESULTS:  Lab Results  Component Value Date   NA 135 11/16/2022   K 4.5 11/16/2022   CL 105 11/16/2022   CO2 26 11/16/2022   GLUCOSE 93 11/16/2022   BUN 23 11/16/2022   CREATININE 1.36 (H) 11/16/2022   CALCIUM 8.8 (L) 11/16/2022   PROT 6.5 11/16/2022   ALBUMIN 3.8 11/16/2022   AST 16 11/16/2022   ALT 13 11/16/2022   ALKPHOS 51 11/16/2022   BILITOT 0.7 11/16/2022   GFRNONAA 56 (L) 11/16/2022   GFRAA >60 07/05/2020    Lab Results  Component Value Date   WBC 12.1 (H) 11/16/2022   NEUTROABS 27.5 (H) 10/05/2021   HGB 15.1 11/16/2022   HCT 45.1 11/16/2022   MCV 83.8 11/16/2022   PLT 190 11/16/2022     STUDIES: No results found.  ASSESSMENT: CLL.  PLAN:    CLL: Right axillary lymph node biopsy confirmed persistent disease although  patient continues to remain asymptomatic.  His white blood cell count continues to be within normal limits.  PET scan results from December 03, 2021 reviewed independently with minimal hypermetabolic activity in lymph nodes.  Spleen has also reduced in size since 2017 where is reported 20 cm now only 15 cm.  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  Repeat CT scan on March 23, 2022 revealed minimal and stable lymphadenopathy.  Patient initially was on Imbruvica from November 2017 through November 2022.  He recently restarted treatment in November 2023 secondary to worsening night sweats, fatigue, and slightly progressed axillary lymph nodes.  Patient's symptoms have resolved since reinitiating treatment and he feels nearly back to his baseline.  No intervention is needed at this time.  Return to clinic in 1 month with repeat laboratory work and further evaluation.  Appreciate clinical pharmacy  input. Neuropathy, foot drop: Chronic and unchanged.  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  Patient was previously given a referral to Occupational Therapy to help improve balance. Difficulty swallowing: Patient does not complain of this today.  Appreciate GI input.  Patient had esophageal dilation on June 13, 2018. Hyperglycemia: Resolved.   Thrombocytopenia: Patient's platelet count has been within the normal range since November 2021. Bladder outlet obstruction secondary to BPH: Continue follow-up with urology as scheduled. Leukocytosis: Mild, monitor. Renal insufficiency: Mild.  Patient's creatinine is 1.36 today. Right lower lobe lung mass: Resolved.  CT scan results from March 23, 2022 reviewed independently with further decrease in size of lesion.  Previous PET scan revealed minimal SUV likely indicating a benign process.     Patient expressed understanding and was in agreement with this plan. He also understands that He can call clinic at any time with any questions, concerns, or complaints.    Lloyd Huger, MD   11/16/2022 2:24 PM

## 2022-12-02 ENCOUNTER — Other Ambulatory Visit (HOSPITAL_COMMUNITY): Payer: Self-pay

## 2022-12-04 ENCOUNTER — Other Ambulatory Visit (HOSPITAL_COMMUNITY): Payer: Self-pay

## 2022-12-09 ENCOUNTER — Other Ambulatory Visit (HOSPITAL_COMMUNITY): Payer: Self-pay

## 2022-12-09 ENCOUNTER — Other Ambulatory Visit: Payer: Self-pay

## 2022-12-24 ENCOUNTER — Inpatient Hospital Stay (HOSPITAL_BASED_OUTPATIENT_CLINIC_OR_DEPARTMENT_OTHER): Payer: BC Managed Care – PPO | Admitting: Oncology

## 2022-12-24 ENCOUNTER — Other Ambulatory Visit (HOSPITAL_COMMUNITY): Payer: Self-pay

## 2022-12-24 ENCOUNTER — Encounter: Payer: Self-pay | Admitting: Oncology

## 2022-12-24 ENCOUNTER — Inpatient Hospital Stay: Payer: BC Managed Care – PPO | Attending: Oncology

## 2022-12-24 ENCOUNTER — Inpatient Hospital Stay: Payer: BC Managed Care – PPO | Admitting: Pharmacist

## 2022-12-24 VITALS — BP 139/82 | HR 61 | Temp 97.3°F | Resp 16 | Ht 71.0 in | Wt 207.0 lb

## 2022-12-24 DIAGNOSIS — C9110 Chronic lymphocytic leukemia of B-cell type not having achieved remission: Secondary | ICD-10-CM

## 2022-12-24 DIAGNOSIS — N401 Enlarged prostate with lower urinary tract symptoms: Secondary | ICD-10-CM | POA: Insufficient documentation

## 2022-12-24 DIAGNOSIS — D696 Thrombocytopenia, unspecified: Secondary | ICD-10-CM | POA: Diagnosis not present

## 2022-12-24 DIAGNOSIS — E1140 Type 2 diabetes mellitus with diabetic neuropathy, unspecified: Secondary | ICD-10-CM | POA: Insufficient documentation

## 2022-12-24 DIAGNOSIS — E1165 Type 2 diabetes mellitus with hyperglycemia: Secondary | ICD-10-CM | POA: Insufficient documentation

## 2022-12-24 DIAGNOSIS — Z800 Family history of malignant neoplasm of digestive organs: Secondary | ICD-10-CM | POA: Diagnosis not present

## 2022-12-24 DIAGNOSIS — Z806 Family history of leukemia: Secondary | ICD-10-CM | POA: Diagnosis not present

## 2022-12-24 LAB — COMPREHENSIVE METABOLIC PANEL
ALT: 16 U/L (ref 0–44)
AST: 18 U/L (ref 15–41)
Albumin: 3.6 g/dL (ref 3.5–5.0)
Alkaline Phosphatase: 61 U/L (ref 38–126)
Anion gap: 6 (ref 5–15)
BUN: 17 mg/dL (ref 8–23)
CO2: 25 mmol/L (ref 22–32)
Calcium: 8.4 mg/dL — ABNORMAL LOW (ref 8.9–10.3)
Chloride: 108 mmol/L (ref 98–111)
Creatinine, Ser: 1.2 mg/dL (ref 0.61–1.24)
GFR, Estimated: >60 mL/min (ref >60)
Glucose, Bld: 153 mg/dL — ABNORMAL HIGH (ref 70–99)
Potassium: 4.5 mmol/L (ref 3.5–5.1)
Sodium: 139 mmol/L (ref 135–145)
Total Bilirubin: 0.7 mg/dL (ref 0.3–1.2)
Total Protein: 6.1 g/dL — ABNORMAL LOW (ref 6.5–8.1)

## 2022-12-24 LAB — CBC
HCT: 41.1 % (ref 39.0–52.0)
Hemoglobin: 13.6 g/dL (ref 13.0–17.0)
MCH: 27.6 pg (ref 26.0–34.0)
MCHC: 33.1 g/dL (ref 30.0–36.0)
MCV: 83.4 fL (ref 80.0–100.0)
Platelets: 150 10*3/uL (ref 150–400)
RBC: 4.93 MIL/uL (ref 4.22–5.81)
RDW: 13.2 % (ref 11.5–15.5)
WBC: 7.8 10*3/uL (ref 4.0–10.5)
nRBC: 0 % (ref 0.0–0.2)

## 2022-12-24 MED ORDER — IBRUTINIB 420 MG PO TABS
420.0000 mg | ORAL_TABLET | Freq: Every day | ORAL | 2 refills | Status: DC
  Filled 2022-12-24 – 2023-01-05 (×2): qty 28, 28d supply, fill #0
  Filled 2023-02-03: qty 28, 28d supply, fill #1
  Filled 2023-03-11: qty 28, 28d supply, fill #2

## 2022-12-24 NOTE — Progress Notes (Signed)
[place]  Telephone:([phone] [phone]  Patient Care Team: Adin Hector, MD as PCP - General (Internal Medicine) Lucilla Lame, MD as Consulting Physician (Gastroenterology) Anabel Bene, MD as Referring Physician (Neurology)   Name of the patient: Travis [name]  [id number]  Feb 03, 1951   Date of visit: 12/24/22  HPI: Patient is a 71 y.o. male with CLL. Patient was previously on a ibrutinib treatment break but resumed therapy on 10/15/22.  Reason for Consult: Oral chemotherapy follow-up for ibrutinib therapy.   PAST MEDICAL HISTORY: Past Medical History:  Diagnosis Date   Anxiety    Depression    Diabetes mellitus without complication (HCC)    GERD (gastroesophageal reflux disease)    Leukemia (HCC)    chronic lymphocytic   Pneumonia     HEMATOLOGY/ONCOLOGY HISTORY:  Oncology History   No history exists.    ALLERGIES:  has No Known Allergies.  MEDICATIONS:  Current Outpatient Medications  Medication Sig Dispense Refill   acetaminophen (TYLENOL) 500 MG tablet Take 500-1,000 mg by mouth every 6 (six) hours as needed for mild pain.     Blood Glucose Monitoring Suppl ([place]) w/Device KIT See admin instructions.     cholecalciferol (VITAMIN D3) 25 MCG (1000 UNIT) tablet Take 1,000 Units by mouth daily.     citalopram (CELEXA) 40 MG tablet Take 40 mg by mouth daily.     ELIQUIS 5 MG TABS tablet Take 5 mg by mouth daily.     ibrutinib (IMBRUVICA) 420 MG tablet Take 1 tablet (420 mg total) by mouth daily. Take with a glass of water. 28 tablet 2   insulin detemir (LEVEMIR) 100 UNIT/ML injection Inject 15 Units into the skin 2 (two) times daily.     Insulin Pen Needle 31G X 5 MM MISC Use as directed 60 each 2   Lancets (FREESTYLE) lancets Use as instructed 100 each 2   metFORMIN (GLUCOPHAGE) 1000 MG tablet Take 1,000 mg by mouth 2 (two) times daily with a meal.     metoprolol succinate  (TOPROL-XL) 25 MG 24 hr tablet Take 25 mg by mouth daily.     Multiple Vitamin (MULTIVITAMIN WITH MINERALS) TABS tablet Take 1 tablet by mouth daily.     omeprazole (PRILOSEC) 20 MG capsule Take 1 capsule (20 mg total) by mouth daily. 90 capsule 3   ONETOUCH VERIO test strip 2 (two) times daily. use for testing     pantoprazole (PROTONIX) 40 MG tablet Take 1 tablet 2 times daily for 30 days, then 1 tablet once daily thereafter (Patient not taking: Reported on 10/15/2022) 120 tablet 0   sildenafil (REVATIO) 20 MG tablet Take 3-5 tablets 1 hr prior to intercourse as needed 60 tablet 12   No current facility-administered medications for this visit.    VITAL SIGNS: There were no vitals taken for this visit. There were no vitals filed for this visit.  Estimated body mass index is 28.87 kg/m as calculated from the following:   Height as of an earlier encounter on 12/24/22: _0  (1.803 m).   Weight as of an earlier encounter on 12/24/22: 93.9 kg (207 lb).  LABS: CBC:    Component Value Date/Time   WBC 12.1 (H) 11/16/2022 1021   HGB 15.1 11/16/2022 1021   HCT 45.1 11/16/2022 1021   PLT 190 11/16/2022 1021   MCV 83.8 11/16/2022 1021   NEUTROABS 27.5 (H) 10/05/2021 1252   LYMPHSABS 1.0 10/05/2021 1252  MONOABS 2.0 (H) 10/05/2021 1252   EOSABS 0.0 10/05/2021 1252   BASOSABS 0.0 10/05/2021 1252   Comprehensive Metabolic Panel:    Component Value Date/Time   NA 135 11/16/2022 1021   K 4.5 11/16/2022 1021   CL 105 11/16/2022 1021   CO2 26 11/16/2022 1021   BUN 23 11/16/2022 1021   CREATININE 1.36 (H) 11/16/2022 1021   GLUCOSE 93 11/16/2022 1021   CALCIUM 8.8 (L) 11/16/2022 1021   AST 16 11/16/2022 1021   ALT 13 11/16/2022 1021   ALKPHOS 51 11/16/2022 1021   BILITOT 0.7 11/16/2022 1021   PROT 6.5 11/16/2022 1021   ALBUMIN 3.8 11/16/2022 1021     Present during today's visit: patient only  Assessment and Plan: CBC and CMP reviewed, WBC and SCr now decreased  Continue  ibrutinib 453m daily   Oral Chemotherapy Side Effect/Intolerance:  Nausea: he has been nausea with some vomiting for the last two days. He reports having antiemetics but does not want to take them. Encouraged patient to use them as needed. He reports remaining hydrated  Fatigue: Patient has noticed a significant increase in fatigue since resuming ibrutinib, at this time patient is uninterested in dose decreasing for the fatigue, will continue to monitor No reported rash and bowel movements are unchanged  Oral Chemotherapy Adherence: No missed doses reported No patient barriers to medication adherence identified.   New medications: None reported  Medication Access Issues: [facility] issues[facility]  Patient expressed understanding and was in agreement with this plan. He also understands that He can call clinic at any time with any questions, concerns, or complaints.   Follow-up plan: RTC in one month  Thank you for allowing me to participate in the care of this very pleasant patient.   Time Total: 15 mins  Visit consisted of counseling and education on dealing with issues of symptom management in the setting of serious and potentially life-threatening illness.Greater than 50%  of this time was spent counseling and coordinating care related to the above assessment and plan.  Signed by: ADarl Pikes PharmD, BCPS, BSalley Slaughter CPP Hematology/Oncology Clinical Pharmacist Practitioner [place]/DB/AP [phone] 12/24/2022 10:59 AM

## 2022-12-24 NOTE — Progress Notes (Signed)
[place]  Telephone:([phone] [phone]  ID: Travis [name]. OB: [birth year]  MR#: [id number]  EXH#:[id number]  Patient Care Team: Adin Hector, MD as PCP - General (Internal Medicine) Lucilla Lame, MD as Consulting Physician (Gastroenterology) Anabel Bene, MD as Referring Physician (Neurology)  CHIEF COMPLAINT: CLL.  INTERVAL HISTORY: Patient returns to clinic today for repeat laboratory work, further evaluation, and continuation of [place].  He has noticed increased fatigue, but otherwise is tolerating his treatments well.  He continues to have occasional night sweats, but these are significantly improved.  He has no new neurologic complaints.  He denies any fevers.  He has a good appetite and denies weight loss.  He denies any chest pain, shortness of breath, cough, or hemoptysis.  He denies any nausea, vomiting, constipation, or diarrhea. He has no urinary complaints.  Patient offers no further specific complaints today.  REVIEW OF SYSTEMS:   Review of Systems  Constitutional:  Positive for diaphoresis and malaise/fatigue. Negative for fever and weight loss.  Respiratory: Negative.  Negative for cough and shortness of breath.   Cardiovascular: Negative.  Negative for chest pain and leg swelling.  Gastrointestinal:  Negative for abdominal pain, blood in stool, melena, nausea and vomiting.  Genitourinary: Negative.  Negative for dysuria.  Musculoskeletal: Negative.  Negative for back pain.  Skin: Negative.  Negative for rash.  Neurological:  Positive for focal weakness. Negative for dizziness, tingling, sensory change, weakness and headaches.  Psychiatric/Behavioral: Negative.  Negative for depression. The patient is not nervous/anxious and does not have insomnia.     As per HPI. Otherwise, a complete review of systems is negative.  PAST MEDICAL HISTORY: Past Medical History:  Diagnosis Date   Anxiety    Depression    Diabetes  mellitus without complication (HCC)    GERD (gastroesophageal reflux disease)    Leukemia (HCC)    chronic lymphocytic   Pneumonia     PAST SURGICAL HISTORY: Past Surgical History:  Procedure Laterality Date   COLONOSCOPY     ESOPHAGOGASTRODUODENOSCOPY (EGD) WITH PROPOFOL N/A 03/14/2018   Procedure: ESOPHAGOGASTRODUODENOSCOPY (EGD) WITH PROPOFOL;  Surgeon: [name], MD;  Location: [facility] ENDOSCOPY;  Service: Gastroenterology;  Laterality: N/A;   ESOPHAGOGASTRODUODENOSCOPY (EGD) WITH PROPOFOL N/A 06/13/2018   Procedure: ESOPHAGOGASTRODUODENOSCOPY (EGD) WITH PROPOFOL;  Surgeon: [name], MD;  Location: [facility] ENDOSCOPY;  Service: Gastroenterology;  Laterality: N/A;   ESOPHAGOGASTRODUODENOSCOPY (EGD) WITH PROPOFOL N/A 10/09/2021   Procedure: ESOPHAGOGASTRODUODENOSCOPY (EGD) WITH PROPOFOL;  Surgeon: Virgel Manifold, MD;  Location: [facility] ENDOSCOPY;  Service: Endoscopy;  Laterality: N/A;   HOLEP-LASER ENUCLEATION OF THE PROSTATE WITH MORCELLATION N/A 12/08/2021   Procedure: HOLEP-LASER ENUCLEATION OF THE PROSTATE WITH MORCELLATION;  Surgeon: Hollice Espy, MD;  Location: [facility] ORS;  Service: Urology;  Laterality: N/A;   TEE WITHOUT CARDIOVERSION N/A 10/09/2021   Procedure: TRANSESOPHAGEAL ECHOCARDIOGRAM (TEE);  Surgeon: Virgel Manifold, MD;  Location: [facility] ENDOSCOPY;  Service: Endoscopy;  Laterality: N/A;   WISDOM TOOTH EXTRACTION      FAMILY HISTORY: Family History  Problem Relation Age of Onset   Leukemia Mother    Colon cancer Father    Diabetes Father    Heart disease Father    Diabetes Brother     ADVANCED DIRECTIVES (Y/N):  N  HEALTH MAINTENANCE: Social History   Tobacco Use   Smoking status: Never   Smokeless tobacco: Never  Vaping Use   Vaping Use: Never used  Substance Use Topics   Alcohol use: Yes  Comment: not at all   Drug use: No     Colonoscopy:  PAP:  Bone density:  Lipid panel:  No Known Allergies  Current Outpatient Medications   Medication Sig Dispense Refill   acetaminophen (TYLENOL) 500 MG tablet Take 500-1,000 mg by mouth every 6 (six) hours as needed for mild pain.     Blood Glucose Monitoring Suppl ([place]) w/Device KIT See admin instructions.     cholecalciferol (VITAMIN D3) 25 MCG (1000 UNIT) tablet Take 1,000 Units by mouth daily.     citalopram (CELEXA) 40 MG tablet Take 40 mg by mouth daily.     ELIQUIS 5 MG TABS tablet Take 5 mg by mouth daily.     ibrutinib (IMBRUVICA) 420 MG tablet Take 1 tablet (420 mg total) by mouth daily. Take with a glass of water. 28 tablet 2   insulin detemir (LEVEMIR) 100 UNIT/ML injection Inject 15 Units into the skin 2 (two) times daily.     Insulin Pen Needle 31G X 5 MM MISC Use as directed 60 each 2   Lancets (FREESTYLE) lancets Use as instructed 100 each 2   metFORMIN (GLUCOPHAGE) 1000 MG tablet Take 1,000 mg by mouth 2 (two) times daily with a meal.     metoprolol succinate (TOPROL-XL) 25 MG 24 hr tablet Take 25 mg by mouth daily.     Multiple Vitamin (MULTIVITAMIN WITH MINERALS) TABS tablet Take 1 tablet by mouth daily.     omeprazole (PRILOSEC) 20 MG capsule Take 1 capsule (20 mg total) by mouth daily. 90 capsule 3   ONETOUCH VERIO test strip 2 (two) times daily. use for testing     sildenafil (REVATIO) 20 MG tablet Take 3-5 tablets 1 hr prior to intercourse as needed 60 tablet 12   pantoprazole (PROTONIX) 40 MG tablet Take 1 tablet 2 times daily for 30 days, then 1 tablet once daily thereafter (Patient not taking: Reported on 10/15/2022) 120 tablet 0   No current facility-administered medications for this visit.    OBJECTIVE: Vitals:   12/24/22 1046  BP: 139/82  Pulse: 61  Resp: 16  Temp: (!) 97.3 F (36.3 C)  SpO2: 100%     Body mass index is 28.87 kg/m.    ECOG FS:1 - Symptomatic but completely ambulatory  General: Well-developed, well-nourished, no acute distress. Eyes: Pink conjunctiva, anicteric sclera. HEENT: Normocephalic, moist  mucous membranes. Lungs: No audible wheezing or coughing. Heart: Regular rate and rhythm. Abdomen: Soft, nontender, no obvious distention. Musculoskeletal: No edema, cyanosis, or clubbing. Neuro: Alert, answering all questions appropriately. Cranial nerves grossly intact. Skin: No rashes or petechiae noted. Psych: Normal affect.  LAB RESULTS:  Lab Results  Component Value Date   NA 139 12/24/2022   K 4.5 12/24/2022   CL 108 12/24/2022   CO2 25 12/24/2022   GLUCOSE 153 (H) 12/24/2022   BUN 17 12/24/2022   CREATININE 1.20 12/24/2022   CALCIUM 8.4 (L) 12/24/2022   PROT 6.1 (L) 12/24/2022   ALBUMIN 3.6 12/24/2022   AST 18 12/24/2022   ALT 16 12/24/2022   ALKPHOS 61 12/24/2022   BILITOT 0.7 12/24/2022   GFRNONAA >60 12/24/2022   GFRAA >60 07/05/2020    Lab Results  Component Value Date   WBC 7.8 12/24/2022   NEUTROABS 27.5 (H) 10/05/2021   HGB 13.6 12/24/2022   HCT 41.1 12/24/2022   MCV 83.4 12/24/2022   PLT 150 12/24/2022     STUDIES: No results found.  ASSESSMENT: CLL.  PLAN:  CLL: Right axillary lymph node biopsy confirmed persistent disease although patient continues to remain asymptomatic.  His white blood cell count continues to be within normal limits.  PET scan results from December 03, 2021 reviewed independently with minimal hypermetabolic activity in lymph nodes.  Spleen has also reduced in size since 2017 where is reported 20 cm now only 15 cm.  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  Repeat CT scan on March 23, 2022 revealed minimal and stable lymphadenopathy.  Patient initially was on Imbruvica from November 2017 through November 2022.  He recently restarted treatment in November 2023 secondary to worsening night sweats, fatigue, and slightly progressed axillary lymph nodes.  Patient's symptoms have significantly improved since restarting treatment.  No intervention is needed at this time.  Continue Imbruvica as prescribed.   Return to clinic in 1 month for evaluation by clinical pharmacy and then in 2 months for further evaluation by MD. Neuropathy, foot drop: Chronic and unchanged.  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  Patient was previously given a referral to Occupational Therapy to help improve balance. Difficulty swallowing: Patient does not complain of this today.  Appreciate GI input.  Patient had esophageal dilation on June 13, 2018. Hyperglycemia: Blood glucose mildly elevated today. Thrombocytopenia: Patient's platelet count has been within the normal range since November 2021. Bladder outlet obstruction secondary to BPH: Continue follow-up with urology as scheduled. Leukocytosis: Resolved.   Renal insufficiency: Resolved.   Right lower lobe lung mass: Resolved.  CT scan results from March 23, 2022 reviewed independently with further decrease in size of lesion.  Previous PET scan revealed minimal SUV likely indicating a benign process.     Patient expressed understanding and was in agreement with this plan. He also understands that He can call clinic at any time with any questions, concerns, or complaints.    Lloyd Huger, MD   12/24/2022 12:28 PM

## 2023-01-05 ENCOUNTER — Other Ambulatory Visit (HOSPITAL_COMMUNITY): Payer: Self-pay

## 2023-01-15 ENCOUNTER — Other Ambulatory Visit (HOSPITAL_COMMUNITY): Payer: Self-pay

## 2023-01-25 ENCOUNTER — Inpatient Hospital Stay: Payer: BC Managed Care – PPO | Admitting: Pharmacist

## 2023-01-25 ENCOUNTER — Inpatient Hospital Stay: Payer: BC Managed Care – PPO

## 2023-01-26 ENCOUNTER — Inpatient Hospital Stay: Payer: BC Managed Care – PPO

## 2023-01-26 ENCOUNTER — Inpatient Hospital Stay: Payer: BC Managed Care – PPO | Admitting: Pharmacist

## 2023-01-28 ENCOUNTER — Other Ambulatory Visit: Payer: Self-pay | Admitting: Gastroenterology

## 2023-02-01 ENCOUNTER — Inpatient Hospital Stay: Payer: BC Managed Care – PPO | Attending: Oncology

## 2023-02-01 ENCOUNTER — Inpatient Hospital Stay: Payer: BC Managed Care – PPO | Admitting: Pharmacist

## 2023-02-01 VITALS — BP 130/85 | HR 54 | Temp 98.2°F | Resp 18

## 2023-02-01 DIAGNOSIS — E1140 Type 2 diabetes mellitus with diabetic neuropathy, unspecified: Secondary | ICD-10-CM | POA: Diagnosis not present

## 2023-02-01 DIAGNOSIS — E1165 Type 2 diabetes mellitus with hyperglycemia: Secondary | ICD-10-CM | POA: Diagnosis not present

## 2023-02-01 DIAGNOSIS — R1310 Dysphagia, unspecified: Secondary | ICD-10-CM | POA: Diagnosis not present

## 2023-02-01 DIAGNOSIS — Z800 Family history of malignant neoplasm of digestive organs: Secondary | ICD-10-CM | POA: Insufficient documentation

## 2023-02-01 DIAGNOSIS — Z806 Family history of leukemia: Secondary | ICD-10-CM | POA: Insufficient documentation

## 2023-02-01 DIAGNOSIS — N401 Enlarged prostate with lower urinary tract symptoms: Secondary | ICD-10-CM | POA: Diagnosis not present

## 2023-02-01 DIAGNOSIS — C9110 Chronic lymphocytic leukemia of B-cell type not having achieved remission: Secondary | ICD-10-CM

## 2023-02-01 DIAGNOSIS — D696 Thrombocytopenia, unspecified: Secondary | ICD-10-CM | POA: Diagnosis not present

## 2023-02-01 LAB — CBC WITH DIFFERENTIAL/PLATELET
Abs Immature Granulocytes: 0.08 10*3/uL — ABNORMAL HIGH (ref 0.00–0.07)
Basophils Absolute: 0.1 10*3/uL (ref 0.0–0.1)
Basophils Relative: 1 %
Eosinophils Absolute: 0.4 10*3/uL (ref 0.0–0.5)
Eosinophils Relative: 4 %
HCT: 44.1 % (ref 39.0–52.0)
Hemoglobin: 14.4 g/dL (ref 13.0–17.0)
Immature Granulocytes: 1 %
Lymphocytes Relative: 44 %
Lymphs Abs: 4.4 10*3/uL — ABNORMAL HIGH (ref 0.7–4.0)
MCH: 27 pg (ref 26.0–34.0)
MCHC: 32.7 g/dL (ref 30.0–36.0)
MCV: 82.7 fL (ref 80.0–100.0)
Monocytes Absolute: 0.8 10*3/uL (ref 0.1–1.0)
Monocytes Relative: 8 %
Neutro Abs: 4.2 10*3/uL (ref 1.7–7.7)
Neutrophils Relative %: 42 %
Platelets: 158 10*3/uL (ref 150–400)
RBC: 5.33 MIL/uL (ref 4.22–5.81)
RDW: 12.8 % (ref 11.5–15.5)
WBC: 10 10*3/uL (ref 4.0–10.5)
nRBC: 0 % (ref 0.0–0.2)

## 2023-02-01 LAB — COMPREHENSIVE METABOLIC PANEL
ALT: 18 U/L (ref 0–44)
AST: 19 U/L (ref 15–41)
Albumin: 3.7 g/dL (ref 3.5–5.0)
Alkaline Phosphatase: 55 U/L (ref 38–126)
Anion gap: 6 (ref 5–15)
BUN: 22 mg/dL (ref 8–23)
CO2: 26 mmol/L (ref 22–32)
Calcium: 8.4 mg/dL — ABNORMAL LOW (ref 8.9–10.3)
Chloride: 105 mmol/L (ref 98–111)
Creatinine, Ser: 1.14 mg/dL (ref 0.61–1.24)
GFR, Estimated: >60 mL/min (ref >60)
Glucose, Bld: 157 mg/dL — ABNORMAL HIGH (ref 70–99)
Potassium: 4.6 mmol/L (ref 3.5–5.1)
Sodium: 137 mmol/L (ref 135–145)
Total Bilirubin: 0.5 mg/dL (ref 0.3–1.2)
Total Protein: 5.8 g/dL — ABNORMAL LOW (ref 6.5–8.1)

## 2023-02-01 NOTE — Progress Notes (Signed)
Patient here today for pharmacy clinic follow up, reports fatigue and increased difficulty with balance.

## 2023-02-01 NOTE — Progress Notes (Signed)
[place]  [phone] [phone]  Patient Care Team: Adin Hector, MD as PCP - General (Internal Medicine) Lucilla Lame, MD as Consulting Physician (Gastroenterology) Anabel Bene, MD as Referring Physician (Neurology)   Name of the patient: Travis [name]  [id number]  08-07-1951   Date of visit: 02/01/23  HPI: Patient is a 72 y.o. male with CLL. Patient was previously on a ibrutinib treatment break but resumed therapy on 10/15/22.  Reason for Consult: Oral chemotherapy follow-up for ibrutinib therapy.   PAST MEDICAL HISTORY: Past Medical History:  Diagnosis Date   Anxiety    Depression    Diabetes mellitus without complication (HCC)    GERD (gastroesophageal reflux disease)    Leukemia (HCC)    chronic lymphocytic   Pneumonia     HEMATOLOGY/ONCOLOGY HISTORY:  Oncology History   No history exists.    ALLERGIES:  has No Known Allergies.  MEDICATIONS:  Current Outpatient Medications  Medication Sig Dispense Refill   acetaminophen (TYLENOL) 500 MG tablet Take 500-1,000 mg by mouth every 6 (six) hours as needed for mild pain.     Blood Glucose Monitoring Suppl ([place]) w/Device KIT See admin instructions.     cholecalciferol (VITAMIN D3) 25 MCG (1000 UNIT) tablet Take 1,000 Units by mouth daily.     citalopram (CELEXA) 40 MG tablet Take 40 mg by mouth daily.     ELIQUIS 5 MG TABS tablet Take 5 mg by mouth daily.     ibrutinib (IMBRUVICA) 420 MG tablet Take 1 tablet (420 mg total) by mouth daily. Take with a glass of water. 28 tablet 2   insulin detemir (LEVEMIR) 100 UNIT/ML injection Inject 15 Units into the skin 2 (two) times daily.     Insulin Pen Needle 31G X 5 MM MISC Use as directed 60 each 2   Lancets (FREESTYLE) lancets Use as instructed 100 each 2   metFORMIN (GLUCOPHAGE) 1000 MG tablet Take 1,000 mg by mouth 2 (two) times daily with a meal.     metoprolol succinate  (TOPROL-XL) 25 MG 24 hr tablet Take 25 mg by mouth daily.     Multiple Vitamin (MULTIVITAMIN WITH MINERALS) TABS tablet Take 1 tablet by mouth daily.     omeprazole (PRILOSEC) 20 MG capsule TAKE 1 CAPSULE BY MOUTH EVERY DAY 90 capsule 3   ONETOUCH VERIO test strip 2 (two) times daily. use for testing     sildenafil (REVATIO) 20 MG tablet Take 3-5 tablets 1 hr prior to intercourse as needed 60 tablet 12   No current facility-administered medications for this visit.    VITAL SIGNS: BP 130/85   Pulse (!) 54   Temp 98.2 F (36.8 C) (Tympanic)   Resp 18  There were no vitals filed for this visit.  Estimated body mass index is 28.87 kg/m as calculated from the following:   Height as of 12/24/22: '5\' 11"'$  (1.803 m).   Weight as of 12/24/22: 93.9 kg (207 lb).  LABS: CBC:    Component Value Date/Time   WBC 10.0 02/01/2023 1036   HGB 14.4 02/01/2023 1036   HCT 44.1 02/01/2023 1036   PLT 158 02/01/2023 1036   MCV 82.7 02/01/2023 1036   NEUTROABS 4.2 02/01/2023 1036   LYMPHSABS 4.4 (H) 02/01/2023 1036   MONOABS 0.8 02/01/2023 1036   EOSABS 0.4 02/01/2023 1036   BASOSABS 0.1 02/01/2023 1036   Comprehensive Metabolic Panel:    Component Value Date/Time  NA 137 02/01/2023 1036   K 4.6 02/01/2023 1036   CL 105 02/01/2023 1036   CO2 26 02/01/2023 1036   BUN 22 02/01/2023 1036   CREATININE 1.14 02/01/2023 1036   GLUCOSE 157 (H) 02/01/2023 1036   CALCIUM 8.4 (L) 02/01/2023 1036   AST 19 02/01/2023 1036   ALT 18 02/01/2023 1036   ALKPHOS 55 02/01/2023 1036   BILITOT 0.5 02/01/2023 1036   PROT 5.8 (L) 02/01/2023 1036   ALBUMIN 3.7 02/01/2023 1036     Present during today's visit: patient only  Assessment and Plan: CBC and CMP reviewed, ALC wnl  Continue ibrutinib '420mg'$  daily   Oral Chemotherapy Side Effect/Intolerance:  Fatigue: patient continues to report fatigue, at this time patient is uninterested in dose decreasing for the fatigue, will continue to monitor No reported  edema, rash, or nausea   Oral Chemotherapy Adherence: No missed doses reported No patient barriers to medication adherence identified.   New medications: None reported  Medication Access Issues: No issues, patient fills at [facility]  Patient expressed understanding and was in agreement with this plan. He also understands that He can call clinic at any time with any questions, concerns, or complaints.   Follow-up plan: RTC in one month  Thank you for allowing me to participate in the care of this very pleasant patient.   Time Total: 15 mins  Visit consisted of counseling and education on dealing with issues of symptom management in the setting of serious and potentially life-threatening illness.Greater than 50%  of this time was spent counseling and coordinating care related to the above assessment and plan.  Signed by: Darl Pikes, PharmD, BCPS, Salley Slaughter, CPP Hematology/Oncology Clinical Pharmacist Practitioner Ignacio/DB/AP [facility] [phone]  02/01/2023 1:28 PM

## 2023-02-03 ENCOUNTER — Other Ambulatory Visit (HOSPITAL_COMMUNITY): Payer: Self-pay

## 2023-02-12 ENCOUNTER — Other Ambulatory Visit: Payer: Self-pay

## 2023-02-24 ENCOUNTER — Encounter: Payer: Self-pay | Admitting: Oncology

## 2023-02-24 ENCOUNTER — Inpatient Hospital Stay: Payer: BC Managed Care – PPO

## 2023-02-24 ENCOUNTER — Inpatient Hospital Stay (HOSPITAL_BASED_OUTPATIENT_CLINIC_OR_DEPARTMENT_OTHER): Payer: BC Managed Care – PPO | Admitting: Oncology

## 2023-02-24 VITALS — BP 120/80 | HR 58 | Temp 97.0°F | Resp 18 | Wt 202.8 lb

## 2023-02-24 DIAGNOSIS — C9110 Chronic lymphocytic leukemia of B-cell type not having achieved remission: Secondary | ICD-10-CM

## 2023-02-24 LAB — CBC WITH DIFFERENTIAL/PLATELET
Abs Immature Granulocytes: 0.09 10*3/uL — ABNORMAL HIGH (ref 0.00–0.07)
Basophils Absolute: 0.1 10*3/uL (ref 0.0–0.1)
Basophils Relative: 1 %
Eosinophils Absolute: 0.3 10*3/uL (ref 0.0–0.5)
Eosinophils Relative: 3 %
HCT: 45.5 % (ref 39.0–52.0)
Hemoglobin: 14.7 g/dL (ref 13.0–17.0)
Immature Granulocytes: 1 %
Lymphocytes Relative: 44 %
Lymphs Abs: 4.6 10*3/uL — ABNORMAL HIGH (ref 0.7–4.0)
MCH: 26.7 pg (ref 26.0–34.0)
MCHC: 32.3 g/dL (ref 30.0–36.0)
MCV: 82.7 fL (ref 80.0–100.0)
Monocytes Absolute: 0.7 10*3/uL (ref 0.1–1.0)
Monocytes Relative: 7 %
Neutro Abs: 4.4 10*3/uL (ref 1.7–7.7)
Neutrophils Relative %: 44 %
Platelets: 163 10*3/uL (ref 150–400)
RBC: 5.5 MIL/uL (ref 4.22–5.81)
RDW: 12.6 % (ref 11.5–15.5)
WBC: 10.1 10*3/uL (ref 4.0–10.5)
nRBC: 0 % (ref 0.0–0.2)

## 2023-02-24 LAB — COMPREHENSIVE METABOLIC PANEL
ALT: 13 U/L (ref 0–44)
AST: 16 U/L (ref 15–41)
Albumin: 3.8 g/dL (ref 3.5–5.0)
Alkaline Phosphatase: 51 U/L (ref 38–126)
Anion gap: 7 (ref 5–15)
BUN: 23 mg/dL (ref 8–23)
CO2: 25 mmol/L (ref 22–32)
Calcium: 8.5 mg/dL — ABNORMAL LOW (ref 8.9–10.3)
Chloride: 104 mmol/L (ref 98–111)
Creatinine, Ser: 1.11 mg/dL (ref 0.61–1.24)
GFR, Estimated: >60 mL/min (ref >60)
Glucose, Bld: 139 mg/dL — ABNORMAL HIGH (ref 70–99)
Potassium: 4.2 mmol/L (ref 3.5–5.1)
Sodium: 136 mmol/L (ref 135–145)
Total Bilirubin: 0.8 mg/dL (ref 0.3–1.2)
Total Protein: 6 g/dL — ABNORMAL LOW (ref 6.5–8.1)

## 2023-02-24 NOTE — Progress Notes (Signed)
Travis [name]  [phone] [phone]  ID: [name]. OB: 05-26-1951  MR#: [id number]  [id number]  Patient Care Team: Adin Hector, MD as PCP - General (Internal Medicine) Lucilla Lame, MD as Consulting Physician (Gastroenterology) Anabel Bene, MD as Referring Physician (Neurology)  CHIEF COMPLAINT: CLL.  INTERVAL HISTORY: Patient returns to clinic today for repeat laboratory work, further evaluation, and continuation Imbruvica.  He continues to tolerate his treatment well with only some mild fatigue.  He has no new neurologic complaints.  He denies any fevers.  He has a good appetite and denies weight loss.  He denies any chest pain, shortness of breath, cough, or hemoptysis.  He denies any nausea, vomiting, constipation, or diarrhea. He has no urinary complaints.  Patient offers no further specific complaints today.  REVIEW OF SYSTEMS:   Review of Systems  Constitutional:  Positive for diaphoresis and malaise/fatigue. Negative for fever and weight loss.  Respiratory: Negative.  Negative for cough and shortness of breath.   Cardiovascular: Negative.  Negative for chest pain and leg swelling.  Gastrointestinal:  Negative for abdominal pain, blood in stool, melena, nausea and vomiting.  Genitourinary: Negative.  Negative for dysuria.  Musculoskeletal: Negative.  Negative for back pain.  Skin: Negative.  Negative for rash.  Neurological:  Positive for focal weakness. Negative for dizziness, tingling, sensory change, weakness and headaches.  Psychiatric/Behavioral: Negative.  Negative for depression. The patient is not nervous/anxious and does not have insomnia.     As per HPI. Otherwise, a complete review of systems is negative.  PAST MEDICAL HISTORY: Past Medical History:  Diagnosis Date   Anxiety    Depression    Diabetes mellitus without complication (HCC)    GERD (gastroesophageal reflux disease)    Leukemia (HCC)     chronic lymphocytic   Pneumonia     PAST SURGICAL HISTORY: Past Surgical History:  Procedure Laterality Date   COLONOSCOPY     ESOPHAGOGASTRODUODENOSCOPY (EGD) WITH PROPOFOL N/A 03/14/2018   Procedure: ESOPHAGOGASTRODUODENOSCOPY (EGD) WITH PROPOFOL;  Surgeon: [name], MD;  Location: [facility] ENDOSCOPY;  Service: Gastroenterology;  Laterality: N/A;   ESOPHAGOGASTRODUODENOSCOPY (EGD) WITH PROPOFOL N/A 06/13/2018   Procedure: ESOPHAGOGASTRODUODENOSCOPY (EGD) WITH PROPOFOL;  Surgeon: [name], MD;  Location: [facility] ENDOSCOPY;  Service: Gastroenterology;  Laterality: N/A;   ESOPHAGOGASTRODUODENOSCOPY (EGD) WITH PROPOFOL N/A 10/09/2021   Procedure: ESOPHAGOGASTRODUODENOSCOPY (EGD) WITH PROPOFOL;  Surgeon: Virgel Manifold, MD;  Location: [facility] ENDOSCOPY;  Service: Endoscopy;  Laterality: N/A;   HOLEP-LASER ENUCLEATION OF THE PROSTATE WITH MORCELLATION N/A 12/08/2021   Procedure: HOLEP-LASER ENUCLEATION OF THE PROSTATE WITH MORCELLATION;  Surgeon: Hollice Espy, MD;  Location: [facility] ORS;  Service: Urology;  Laterality: N/A;   TEE WITHOUT CARDIOVERSION N/A 10/09/2021   Procedure: TRANSESOPHAGEAL ECHOCARDIOGRAM (TEE);  Surgeon: Virgel Manifold, MD;  Location[facility] ENDOSCOPY;  Service: Endoscopy;  Laterality: N/A;   WISDOM TOOTH EXTRACTION      FAMILY HISTORY: Family History  Problem Relation Age of Onset   Leukemia Mother    Colon cancer Father    Diabetes Father    Heart disease Father    Diabetes Brother     ADVANCED DIRECTIVES (Y/N):  N  HEALTH MAINTENANCE: Social History   Tobacco Use   Smoking status: Never   Smokeless tobacco: Never  Vaping Use   Vaping Use: Never used  Substance Use Topics   Alcohol use: Yes    Comment: not at all   Drug use: No  Colonoscopy:  PAP:  Bone density:  Lipid panel:  No Known Allergies  Current Outpatient Medications  Medication Sig Dispense Refill   acetaminophen (TYLENOL) 500 MG tablet Take 500-1,000 mg by mouth every 6  (six) hours as needed for mild pain.     Blood Glucose Monitoring Suppl ([place]) w/Device KIT See admin instructions.     cholecalciferol (VITAMIN D3) 25 MCG (1000 UNIT) tablet Take 1,000 Units by mouth daily.     citalopram (CELEXA) 40 MG tablet Take 40 mg by mouth daily.     ibrutinib (IMBRUVICA) 420 MG tablet Take 1 tablet (420 mg total) by mouth daily. Take with a glass of water. 28 tablet 2   Insulin Pen Needle 31G X 5 MM MISC Use as directed 60 each 2   Lancets (FREESTYLE) lancets Use as instructed 100 each 2   LANTUS SOLOSTAR 100 UNIT/ML Solostar Pen Inject into the skin.     metFORMIN (GLUCOPHAGE) 1000 MG tablet Take 1,000 mg by mouth 2 (two) times daily with a meal.     metoprolol succinate (TOPROL-XL) 25 MG 24 hr tablet Take 25 mg by mouth daily.     Multiple Vitamin (MULTIVITAMIN WITH MINERALS) TABS tablet Take 1 tablet by mouth daily.     omeprazole (PRILOSEC) 20 MG capsule TAKE 1 CAPSULE BY MOUTH EVERY DAY 90 capsule 3   ONETOUCH VERIO test strip 2 (two) times daily. use for testing     sildenafil (REVATIO) 20 MG tablet Take 3-5 tablets 1 hr prior to intercourse as needed 60 tablet 12   ELIQUIS 5 MG TABS tablet Take 5 mg by mouth daily. (Patient not taking: Reported on 02/24/2023)     insulin detemir (LEVEMIR) 100 UNIT/ML injection Inject 15 Units into the skin 2 (two) times daily. (Patient not taking: Reported on 02/24/2023)     No current facility-administered medications for this visit.    OBJECTIVE: Vitals:   02/24/23 0948  BP: 120/80  Pulse: (!) 58  Resp: 18  Temp: (!) 97 F (36.1 C)  SpO2: 98%     Body mass index is 28.28 kg/m.    ECOG FS:1 - Symptomatic but completely ambulatory  General: Well-developed, well-nourished, no acute distress. Eyes: Pink conjunctiva, anicteric sclera. HEENT: Normocephalic, moist mucous membranes. Lungs: No audible wheezing or coughing. Heart: Regular rate and rhythm. Abdomen: Soft, nontender, no obvious  distention. Musculoskeletal: No edema, cyanosis, or clubbing. Neuro: Alert, answering all questions appropriately. Cranial nerves grossly intact. Skin: No rashes or petechiae noted. Psych: Normal affect.  LAB RESULTS:  Lab Results  Component Value Date   NA 136 02/24/2023   K 4.2 02/24/2023   CL 104 02/24/2023   CO2 25 02/24/2023   GLUCOSE 139 (H) 02/24/2023   BUN 23 02/24/2023   CREATININE 1.11 02/24/2023   CALCIUM 8.5 (L) 02/24/2023   PROT 6.0 (L) 02/24/2023   ALBUMIN 3.8 02/24/2023   AST 16 02/24/2023   ALT 13 02/24/2023   ALKPHOS 51 02/24/2023   BILITOT 0.8 02/24/2023   GFRNONAA >60 02/24/2023   GFRAA >60 07/05/2020    Lab Results  Component Value Date   WBC 10.1 02/24/2023   NEUTROABS 4.4 02/24/2023   HGB 14.7 02/24/2023   HCT 45.5 02/24/2023   MCV 82.7 02/24/2023   PLT 163 02/24/2023     STUDIES: No results found.  ASSESSMENT: CLL.  PLAN:    CLL: Right axillary lymph node biopsy confirmed persistent disease although patient continues to remain asymptomatic.  His  white blood cell count continues to be within normal limits.  PET scan results from December 03, 2021 reviewed independently with minimal hypermetabolic activity in lymph nodes.  Spleen has also reduced in size since 2017 where is reported 20 cm now only 15 cm.  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  Repeat CT scan on March 23, 2022 revealed minimal and stable lymphadenopathy.  Patient initially was on Imbruvica from November 2017 through November 2022 and then had a 1 year holiday for treatment.  He restarted treatment in November 2023 secondary to worsening night sweats, fatigue, and slightly progressed axillary lymph nodes.  Patient's symptoms have significantly improved since restarting treatment.  No intervention is needed at this time.  Continue Imbruvica as prescribed.  Return to clinic in 3 months for repeat laboratory work and further evaluation.   Neuropathy,  foot drop: Chronic and unchanged.  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  Patient was previously given a referral to Occupational Therapy to help improve balance. Difficulty swallowing: Patient does not complain of this today.  Appreciate GI input.  Patient had esophageal dilation on June 13, 2018. Hyperglycemia: Chronic and unchanged. Thrombocytopenia: Patient's platelet count has been within the normal range since November 2021. Bladder outlet obstruction secondary to BPH: Continue follow-up with urology as scheduled. Leukocytosis: Resolved.   Right lower lobe lung mass: Resolved.  CT scan results from March 23, 2022 reviewed independently with further decrease in size of lesion.  Previous PET scan revealed minimal SUV likely indicating a benign process.    I spent a total of 30 minutes reviewing chart data, face-to-face evaluation with the patient, counseling and coordination of care as detailed above.    Patient expressed understanding and was in agreement with this plan. He also understands that He can call clinic at any time with any questions, concerns, or complaints.    Lloyd Huger, MD   02/24/2023 3:14 PM

## 2023-02-25 ENCOUNTER — Encounter: Payer: Self-pay | Admitting: Internal Medicine

## 2023-03-01 ENCOUNTER — Other Ambulatory Visit: Payer: Self-pay | Admitting: Internal Medicine

## 2023-03-01 DIAGNOSIS — I77810 Thoracic aortic ectasia: Secondary | ICD-10-CM

## 2023-03-11 ENCOUNTER — Other Ambulatory Visit (HOSPITAL_COMMUNITY): Payer: Self-pay

## 2023-03-17 ENCOUNTER — Ambulatory Visit
Admission: RE | Admit: 2023-03-17 | Discharge: 2023-03-17 | Disposition: A | Discharge status: Home / Self Care | Payer: BC Managed Care – PPO | Source: Ambulatory Visit | Attending: Internal Medicine | Admitting: Internal Medicine

## 2023-03-17 DIAGNOSIS — I77810 Thoracic aortic ectasia: Secondary | ICD-10-CM

## 2023-03-17 MED ORDER — IOHEXOL 350 MG/ML SOLN
75.0000 mL | Freq: Once | INTRAVENOUS | Status: AC | PRN
  Administered 2023-03-17: 75 mL via INTRAVENOUS

## 2023-03-23 ENCOUNTER — Other Ambulatory Visit (HOSPITAL_COMMUNITY): Payer: Self-pay

## 2023-04-20 ENCOUNTER — Other Ambulatory Visit: Payer: Self-pay

## 2023-04-20 ENCOUNTER — Other Ambulatory Visit: Payer: Self-pay | Admitting: Pharmacist

## 2023-04-20 ENCOUNTER — Other Ambulatory Visit (HOSPITAL_COMMUNITY): Payer: Self-pay

## 2023-04-20 DIAGNOSIS — C9110 Chronic lymphocytic leukemia of B-cell type not having achieved remission: Secondary | ICD-10-CM

## 2023-04-20 MED ORDER — IBRUTINIB 420 MG PO TABS
420.0000 mg | ORAL_TABLET | Freq: Every day | ORAL | 2 refills | Status: DC
  Filled 2023-04-20: qty 28, 28d supply, fill #0
  Filled 2023-05-26: qty 28, 28d supply, fill #1
  Filled 2023-07-13: qty 28, 28d supply, fill #2

## 2023-04-22 ENCOUNTER — Other Ambulatory Visit (HOSPITAL_COMMUNITY): Payer: Self-pay

## 2023-04-26 ENCOUNTER — Other Ambulatory Visit: Payer: Self-pay

## 2023-05-20 ENCOUNTER — Other Ambulatory Visit (HOSPITAL_COMMUNITY): Payer: Self-pay

## 2023-05-25 ENCOUNTER — Inpatient Hospital Stay: Payer: BC Managed Care – PPO | Admitting: Oncology

## 2023-05-25 ENCOUNTER — Encounter: Payer: Self-pay | Admitting: Oncology

## 2023-05-25 ENCOUNTER — Inpatient Hospital Stay: Payer: BC Managed Care – PPO | Attending: Oncology

## 2023-05-25 ENCOUNTER — Other Ambulatory Visit (HOSPITAL_COMMUNITY): Payer: Self-pay

## 2023-05-25 VITALS — BP 118/75 | HR 60 | Temp 96.0°F | Wt 204.4 lb

## 2023-05-25 DIAGNOSIS — M21379 Foot drop, unspecified foot: Secondary | ICD-10-CM | POA: Diagnosis not present

## 2023-05-25 DIAGNOSIS — N138 Other obstructive and reflux uropathy: Secondary | ICD-10-CM | POA: Insufficient documentation

## 2023-05-25 DIAGNOSIS — N401 Enlarged prostate with lower urinary tract symptoms: Secondary | ICD-10-CM | POA: Insufficient documentation

## 2023-05-25 DIAGNOSIS — Z806 Family history of leukemia: Secondary | ICD-10-CM | POA: Diagnosis not present

## 2023-05-25 DIAGNOSIS — C9110 Chronic lymphocytic leukemia of B-cell type not having achieved remission: Secondary | ICD-10-CM

## 2023-05-25 DIAGNOSIS — E1165 Type 2 diabetes mellitus with hyperglycemia: Secondary | ICD-10-CM | POA: Diagnosis not present

## 2023-05-25 DIAGNOSIS — D696 Thrombocytopenia, unspecified: Secondary | ICD-10-CM | POA: Insufficient documentation

## 2023-05-25 DIAGNOSIS — E1140 Type 2 diabetes mellitus with diabetic neuropathy, unspecified: Secondary | ICD-10-CM | POA: Diagnosis not present

## 2023-05-25 DIAGNOSIS — Z800 Family history of malignant neoplasm of digestive organs: Secondary | ICD-10-CM | POA: Diagnosis not present

## 2023-05-25 LAB — CBC WITH DIFFERENTIAL/PLATELET
Abs Immature Granulocytes: 0.1 10*3/uL — ABNORMAL HIGH (ref 0.00–0.07)
Basophils Absolute: 0 10*3/uL (ref 0.0–0.1)
Basophils Relative: 1 %
Eosinophils Absolute: 0.2 10*3/uL (ref 0.0–0.5)
Eosinophils Relative: 5 %
HCT: 44.4 % (ref 39.0–52.0)
Hemoglobin: 14.7 g/dL (ref 13.0–17.0)
Immature Granulocytes: 3 %
Lymphocytes Relative: 16 %
Lymphs Abs: 0.6 10*3/uL — ABNORMAL LOW (ref 0.7–4.0)
MCH: 27 pg (ref 26.0–34.0)
MCHC: 33.1 g/dL (ref 30.0–36.0)
MCV: 81.5 fL (ref 80.0–100.0)
Monocytes Absolute: 0.5 10*3/uL (ref 0.1–1.0)
Monocytes Relative: 14 %
Neutro Abs: 2.3 10*3/uL (ref 1.7–7.7)
Neutrophils Relative %: 61 %
Platelets: 117 10*3/uL — ABNORMAL LOW (ref 150–400)
RBC: 5.45 MIL/uL (ref 4.22–5.81)
RDW: 13.6 % (ref 11.5–15.5)
WBC: 3.8 10*3/uL — ABNORMAL LOW (ref 4.0–10.5)
nRBC: 0 % (ref 0.0–0.2)

## 2023-05-25 LAB — COMPREHENSIVE METABOLIC PANEL
ALT: 13 U/L (ref 0–44)
AST: 15 U/L (ref 15–41)
Albumin: 3.7 g/dL (ref 3.5–5.0)
Alkaline Phosphatase: 59 U/L (ref 38–126)
Anion gap: 7 (ref 5–15)
BUN: 19 mg/dL (ref 8–23)
CO2: 25 mmol/L (ref 22–32)
Calcium: 8.5 mg/dL — ABNORMAL LOW (ref 8.9–10.3)
Chloride: 104 mmol/L (ref 98–111)
Creatinine, Ser: 1.14 mg/dL (ref 0.61–1.24)
GFR, Estimated: >60 mL/min (ref >60)
Glucose, Bld: 217 mg/dL — ABNORMAL HIGH (ref 70–99)
Potassium: 4.4 mmol/L (ref 3.5–5.1)
Sodium: 136 mmol/L (ref 135–145)
Total Bilirubin: 0.7 mg/dL (ref 0.3–1.2)
Total Protein: 6 g/dL — ABNORMAL LOW (ref 6.5–8.1)

## 2023-05-25 NOTE — Progress Notes (Signed)
[facility]  Telephone:([phone] [phone]  ID: Travis [name]. OB: 07/31/1951  MR#: [id number]  AOZ#:[id number]  Patient Care Team: Lynnea Ferrier, MD as PCP - General (Internal Medicine) Midge Minium, MD as Consulting Physician (Gastroenterology) Morene Crocker, MD as Referring Physician (Neurology) Jeralyn Ruths, MD as Consulting Physician (Oncology)  CHIEF COMPLAINT: CLL.  INTERVAL HISTORY: Patient returns to clinic today for repeat laboratory work, further evaluation, and continuation of Imbruvica.  He did notice several night sweats last week, but otherwise has felt well.  He continues to tolerate treatment well without significant side effects. He has no new neurologic complaints.  He denies any fevers.  He has a good appetite and denies weight loss.  He denies any chest pain, shortness of breath, cough, or hemoptysis.  He denies any nausea, vomiting, constipation, or diarrhea. He has no urinary complaints.  Patient offers no further specific complaints today.    REVIEW OF SYSTEMS:   Review of Systems  Constitutional:  Positive for diaphoresis and malaise/fatigue. Negative for fever and weight loss.  Respiratory: Negative.  Negative for cough and shortness of breath.   Cardiovascular: Negative.  Negative for chest pain and leg swelling.  Gastrointestinal:  Negative for abdominal pain, blood in stool, melena, nausea and vomiting.  Genitourinary: Negative.  Negative for dysuria.  Musculoskeletal: Negative.  Negative for back pain.  Skin: Negative.  Negative for rash.  Neurological:  Positive for focal weakness. Negative for dizziness, tingling, sensory change, weakness and headaches.  Psychiatric/Behavioral: Negative.  Negative for depression. The patient is not nervous/anxious and does not have insomnia.     As per HPI. Otherwise, a complete review of systems is negative.  PAST MEDICAL HISTORY: Past Medical History:  Diagnosis  Date   Anxiety    Depression    Diabetes mellitus without complication (HCC)    GERD (gastroesophageal reflux disease)    Leukemia (HCC)    chronic lymphocytic   Pneumonia     PAST SURGICAL HISTORY: Past Surgical History:  Procedure Laterality Date   COLONOSCOPY     ESOPHAGOGASTRODUODENOSCOPY (EGD) WITH PROPOFOL N/A 03/14/2018   Procedure: ESOPHAGOGASTRODUODENOSCOPY (EGD) WITH PROPOFOL;  Surgeon: Wyline Mood, MD;  Location: [facility] ENDOSCOPY;  Service: Gastroenterology;  Laterality: N/A;   ESOPHAGOGASTRODUODENOSCOPY (EGD) WITH PROPOFOL N/A 06/13/2018   Procedure: ESOPHAGOGASTRODUODENOSCOPY (EGD) WITH PROPOFOL;  Surgeon: Wyline Mood, MD;  Location: [facility] ENDOSCOPY;  Service: Gastroenterology;  Laterality: N/A;   ESOPHAGOGASTRODUODENOSCOPY (EGD) WITH PROPOFOL N/A 10/09/2021   Procedure: ESOPHAGOGASTRODUODENOSCOPY (EGD) WITH PROPOFOL;  Surgeon: Pasty Spillers, MD;  Location: [facility] ENDOSCOPY;  Service: Endoscopy;  Laterality: N/A;   HOLEP-LASER ENUCLEATION OF THE PROSTATE WITH MORCELLATION N/A 12/08/2021   Procedure: HOLEP-LASER ENUCLEATION OF THE PROSTATE WITH MORCELLATION;  Surgeon: Vanna Scotland, MD;  Location: [facility] ORS;  Service: Urology;  Laterality: N/A;   TEE WITHOUT CARDIOVERSION N/A 10/09/2021   Procedure: TRANSESOPHAGEAL ECHOCARDIOGRAM (TEE);  Surgeon: Pasty Spillers, MD;  Location: [facility] ENDOSCOPY;  Service: Endoscopy;  Laterality: N/A;   WISDOM TOOTH EXTRACTION      FAMILY HISTORY: Family History  Problem Relation Age of Onset   Leukemia Mother    Colon cancer Father    Diabetes Father    Heart disease Father    Diabetes Brother     ADVANCED DIRECTIVES (Y/N):  N  HEALTH MAINTENANCE: Social History   Tobacco Use   Smoking status: Never   Smokeless tobacco: Never  Vaping Use   Vaping Use: Never used  Substance  Use Topics   Alcohol use: Yes    Comment: not at all   Drug use: No     Colonoscopy:  PAP:  Bone density:  Lipid panel:  No Known  Allergies  Current Outpatient Medications  Medication Sig Dispense Refill   acetaminophen (TYLENOL) 500 MG tablet Take 500-1,000 mg by mouth every 6 (six) hours as needed for mild pain.     Blood Glucose Monitoring Suppl (ONETOUCH VERIO FLEX SYSTEM) w/Device KIT See admin instructions.     cholecalciferol (VITAMIN D3) 25 MCG (1000 UNIT) tablet Take 1,000 Units by mouth daily.     citalopram (CELEXA) 40 MG tablet Take 40 mg by mouth daily.     ELIQUIS 5 MG TABS tablet Take 5 mg by mouth daily.     ibrutinib (IMBRUVICA) 420 MG tablet Take 1 tablet (420 mg total) by mouth daily. Take with a glass of water. 28 tablet 2   insulin detemir (LEVEMIR) 100 UNIT/ML injection Inject 15 Units into the skin 2 (two) times daily.     Insulin Pen Needle 31G X 5 MM MISC Use as directed 60 each 2   Lancets (FREESTYLE) lancets Use as instructed 100 each 2   LANTUS SOLOSTAR 100 UNIT/ML Solostar Pen Inject into the skin.     metFORMIN (GLUCOPHAGE) 1000 MG tablet Take 1,000 mg by mouth 2 (two) times daily with a meal.     metoprolol succinate (TOPROL-XL) 25 MG 24 hr tablet Take 25 mg by mouth daily.     Multiple Vitamin (MULTIVITAMIN WITH MINERALS) TABS tablet Take 1 tablet by mouth daily.     omeprazole (PRILOSEC) 20 MG capsule TAKE 1 CAPSULE BY MOUTH EVERY DAY 90 capsule 3   ONETOUCH VERIO test strip 2 (two) times daily. use for testing     sildenafil (REVATIO) 20 MG tablet Take 3-5 tablets 1 hr prior to intercourse as needed 60 tablet 12   No current facility-administered medications for this visit.    OBJECTIVE: Vitals:   05/25/23 1017  BP: 118/75  Pulse: 60  Temp: (!) 96 F (35.6 C)  SpO2: 100%     Body mass index is 28.51 kg/m.    ECOG FS:1 - Symptomatic but completely ambulatory  General: Well-developed, well-nourished, no acute distress. Eyes: Pink conjunctiva, anicteric sclera. HEENT: Normocephalic, moist mucous membranes. Lungs: No audible wheezing or coughing. Heart: Regular rate and  rhythm. Abdomen: Soft, nontender, no obvious distention. Musculoskeletal: No edema, cyanosis, or clubbing. Neuro: Alert, answering all questions appropriately. Cranial nerves grossly intact. Skin: No rashes or petechiae noted. Psych: Normal affect.  LAB RESULTS:  Lab Results  Component Value Date   NA 136 05/25/2023   K 4.4 05/25/2023   CL 104 05/25/2023   CO2 25 05/25/2023   GLUCOSE 217 (H) 05/25/2023   BUN 19 05/25/2023   CREATININE 1.14 05/25/2023   CALCIUM 8.5 (L) 05/25/2023   PROT 6.0 (L) 05/25/2023   ALBUMIN 3.7 05/25/2023   AST 15 05/25/2023   ALT 13 05/25/2023   ALKPHOS 59 05/25/2023   BILITOT 0.7 05/25/2023   GFRNONAA >60 05/25/2023   GFRAA >60 07/05/2020    Lab Results  Component Value Date   WBC 3.8 (L) 05/25/2023   NEUTROABS 2.3 05/25/2023   HGB 14.7 05/25/2023   HCT 44.4 05/25/2023   MCV 81.5 05/25/2023   PLT 117 (L) 05/25/2023     STUDIES: No results found.  ASSESSMENT: CLL.  PLAN:    CLL: Right axillary lymph node biopsy confirmed persistent  disease although patient continues to remain asymptomatic.  His white blood cell count continues to be within normal limits.  PET scan results from December 03, 2021 reviewed independently with minimal hypermetabolic activity in lymph nodes.  Spleen has also reduced in size since 2017 where is reported 20 cm now only 15 cm.  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  Repeat CT scan on March 23, 2022 revealed minimal and stable lymphadenopathy.  Patient initially was on Imbruvica from November 2017 through November 2022 and then had a 1 year treatment holiday.  He restarted treatment in November 2023 secondary to worsening night sweats, fatigue, and slightly progressed axillary lymph nodes.  Patient's symptoms have significantly improved since restarting treatment.  No intervention is needed at this time.  Continue Imbruvica as prescribed.  Return to clinic in 3 months with repeat  laboratory and video-assisted telemedicine visit. Neuropathy, foot drop: Chronic and unchanged.  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  Patient was previously given a referral to Occupational Therapy to help improve balance. Hyperglycemia: Chronic and unchanged. Thrombocytopenia: Mild, monitor. Bladder outlet obstruction secondary to BPH: Continue follow-up with urology as scheduled. Leukopenia: Mild, monitor. Right lower lobe lung mass: Resolved.  CT scan results from March 23, 2022 reviewed independently with further decrease in size of lesion.  Previous PET scan revealed minimal SUV likely indicating a benign process.     Patient expressed understanding and was in agreement with this plan. He also understands that He can call clinic at any time with any questions, concerns, or complaints.    Jeralyn Ruths, MD   05/25/2023 10:45 AM

## 2023-05-26 ENCOUNTER — Other Ambulatory Visit: Payer: Self-pay

## 2023-06-07 ENCOUNTER — Other Ambulatory Visit: Payer: Self-pay

## 2023-06-29 ENCOUNTER — Other Ambulatory Visit (HOSPITAL_COMMUNITY): Payer: Self-pay

## 2023-07-13 ENCOUNTER — Other Ambulatory Visit (HOSPITAL_COMMUNITY): Payer: Self-pay

## 2023-07-14 ENCOUNTER — Other Ambulatory Visit (HOSPITAL_COMMUNITY): Payer: Self-pay

## 2023-08-10 ENCOUNTER — Other Ambulatory Visit: Payer: Self-pay | Admitting: Pharmacist

## 2023-08-10 ENCOUNTER — Other Ambulatory Visit: Payer: Self-pay

## 2023-08-10 ENCOUNTER — Other Ambulatory Visit (HOSPITAL_COMMUNITY): Payer: Self-pay

## 2023-08-10 DIAGNOSIS — C9110 Chronic lymphocytic leukemia of B-cell type not having achieved remission: Secondary | ICD-10-CM

## 2023-08-10 MED ORDER — IBRUTINIB 420 MG PO TABS
420.0000 mg | ORAL_TABLET | Freq: Every day | ORAL | 2 refills | Status: DC
  Filled 2023-08-10: qty 28, 28d supply, fill #0
  Filled 2023-09-08: qty 28, 28d supply, fill #1
  Filled 2023-10-18: qty 28, 28d supply, fill #2

## 2023-08-13 ENCOUNTER — Other Ambulatory Visit (HOSPITAL_COMMUNITY): Payer: Self-pay

## 2023-08-13 ENCOUNTER — Other Ambulatory Visit: Payer: Self-pay

## 2023-08-25 ENCOUNTER — Inpatient Hospital Stay: Payer: BC Managed Care – PPO

## 2023-08-25 ENCOUNTER — Inpatient Hospital Stay: Payer: BC Managed Care – PPO | Admitting: Oncology

## 2023-08-26 ENCOUNTER — Other Ambulatory Visit: Payer: Self-pay | Admitting: Internal Medicine

## 2023-08-26 DIAGNOSIS — I77810 Thoracic aortic ectasia: Secondary | ICD-10-CM

## 2023-08-26 DIAGNOSIS — Z794 Long term (current) use of insulin: Secondary | ICD-10-CM

## 2023-09-01 ENCOUNTER — Inpatient Hospital Stay: Payer: BC Managed Care – PPO | Attending: Oncology

## 2023-09-01 ENCOUNTER — Inpatient Hospital Stay (HOSPITAL_BASED_OUTPATIENT_CLINIC_OR_DEPARTMENT_OTHER): Payer: BC Managed Care – PPO | Admitting: Oncology

## 2023-09-01 VITALS — BP 139/79 | HR 60 | Temp 98.6°F | Wt 202.0 lb

## 2023-09-01 DIAGNOSIS — N138 Other obstructive and reflux uropathy: Secondary | ICD-10-CM | POA: Diagnosis not present

## 2023-09-01 DIAGNOSIS — N401 Enlarged prostate with lower urinary tract symptoms: Secondary | ICD-10-CM | POA: Diagnosis not present

## 2023-09-01 DIAGNOSIS — Z800 Family history of malignant neoplasm of digestive organs: Secondary | ICD-10-CM | POA: Diagnosis not present

## 2023-09-01 DIAGNOSIS — G629 Polyneuropathy, unspecified: Secondary | ICD-10-CM | POA: Diagnosis not present

## 2023-09-01 DIAGNOSIS — M21379 Foot drop, unspecified foot: Secondary | ICD-10-CM | POA: Diagnosis not present

## 2023-09-01 DIAGNOSIS — C9110 Chronic lymphocytic leukemia of B-cell type not having achieved remission: Secondary | ICD-10-CM

## 2023-09-01 DIAGNOSIS — Z806 Family history of leukemia: Secondary | ICD-10-CM | POA: Diagnosis not present

## 2023-09-01 LAB — CBC WITH DIFFERENTIAL/PLATELET
Abs Immature Granulocytes: 0.12 10*3/uL — ABNORMAL HIGH (ref 0.00–0.07)
Basophils Absolute: 0.1 10*3/uL (ref 0.0–0.1)
Basophils Relative: 1 %
Eosinophils Absolute: 0.4 10*3/uL (ref 0.0–0.5)
Eosinophils Relative: 5 %
HCT: 43.9 % (ref 39.0–52.0)
Hemoglobin: 14.5 g/dL (ref 13.0–17.0)
Immature Granulocytes: 1 %
Lymphocytes Relative: 45 %
Lymphs Abs: 4 10*3/uL (ref 0.7–4.0)
MCH: 27.9 pg (ref 26.0–34.0)
MCHC: 33 g/dL (ref 30.0–36.0)
MCV: 84.6 fL (ref 80.0–100.0)
Monocytes Absolute: 0.7 10*3/uL (ref 0.1–1.0)
Monocytes Relative: 7 %
Neutro Abs: 3.7 10*3/uL (ref 1.7–7.7)
Neutrophils Relative %: 41 %
Platelets: 160 10*3/uL (ref 150–400)
RBC: 5.19 MIL/uL (ref 4.22–5.81)
RDW: 12.9 % (ref 11.5–15.5)
WBC: 9 10*3/uL (ref 4.0–10.5)
nRBC: 0 % (ref 0.0–0.2)

## 2023-09-01 LAB — COMPREHENSIVE METABOLIC PANEL
ALT: 13 U/L (ref 0–44)
AST: 15 U/L (ref 15–41)
Albumin: 3.7 g/dL (ref 3.5–5.0)
Alkaline Phosphatase: 48 U/L (ref 38–126)
Anion gap: 6 (ref 5–15)
BUN: 15 mg/dL (ref 8–23)
CO2: 25 mmol/L (ref 22–32)
Calcium: 8.5 mg/dL — ABNORMAL LOW (ref 8.9–10.3)
Chloride: 106 mmol/L (ref 98–111)
Creatinine, Ser: 1.23 mg/dL (ref 0.61–1.24)
GFR, Estimated: >60 mL/min (ref >60)
Glucose, Bld: 127 mg/dL — ABNORMAL HIGH (ref 70–99)
Potassium: 4.9 mmol/L (ref 3.5–5.1)
Sodium: 137 mmol/L (ref 135–145)
Total Bilirubin: 0.8 mg/dL (ref 0.3–1.2)
Total Protein: 5.7 g/dL — ABNORMAL LOW (ref 6.5–8.1)

## 2023-09-01 NOTE — Progress Notes (Signed)
[facility]  Telephone:([phone] [phone]  ID: Travis [name]. OB: 01/13/1951  MR#: [id number]  AOZ#:[id number]  Patient Care Team: Lynnea Ferrier, MD as PCP - General (Internal Medicine) Midge Minium, MD as Consulting Physician (Gastroenterology) Morene Crocker, MD as Referring Physician (Neurology) Jeralyn Ruths, MD as Consulting Physician (Oncology)  CHIEF COMPLAINT: CLL.  INTERVAL HISTORY: Patient returns to clinic today for repeat laboratory work, further evaluation, and continuation of Imbruvica.  He currently feels well and is asymptomatic.  He is tolerating treatment without significant side effects.  He denies any further night sweats.  He has no new neurologic complaints.  He denies any fevers.  He has a good appetite and denies weight loss.  He denies any chest pain, shortness of breath, cough, or hemoptysis.  He denies any nausea, vomiting, constipation, or diarrhea. He has no urinary complaints.  Patient offers no specific complaints today.  REVIEW OF SYSTEMS:   Review of Systems  Constitutional: Negative.  Negative for diaphoresis, fever, malaise/fatigue and weight loss.  Respiratory: Negative.  Negative for cough and shortness of breath.   Cardiovascular: Negative.  Negative for chest pain and leg swelling.  Gastrointestinal:  Negative for abdominal pain, blood in stool, melena, nausea and vomiting.  Genitourinary: Negative.  Negative for dysuria.  Musculoskeletal: Negative.  Negative for back pain.  Skin: Negative.  Negative for rash.  Neurological: Negative.  Negative for dizziness, tingling, sensory change, focal weakness, weakness and headaches.  Psychiatric/Behavioral: Negative.  Negative for depression. The patient is not nervous/anxious and does not have insomnia.     As per HPI. Otherwise, a complete review of systems is negative.  PAST MEDICAL HISTORY: Past Medical History:  Diagnosis Date   Anxiety     Depression    Diabetes mellitus without complication (HCC)    GERD (gastroesophageal reflux disease)    Leukemia (HCC)    chronic lymphocytic   Pneumonia     PAST SURGICAL HISTORY: Past Surgical History:  Procedure Laterality Date   COLONOSCOPY     ESOPHAGOGASTRODUODENOSCOPY (EGD) WITH PROPOFOL N/A 03/14/2018   Procedure: ESOPHAGOGASTRODUODENOSCOPY (EGD) WITH PROPOFOL;  Surgeon: Wyline Mood, MD;  Location[facility] ENDOSCOPY;  Service: Gastroenterology;  Laterality: N/A;   ESOPHAGOGASTRODUODENOSCOPY (EGD) WITH PROPOFOL N/A 06/13/2018   Procedure: ESOPHAGOGASTRODUODENOSCOPY (EGD) WITH PROPOFOL;  Surgeon: Wyline Mood, MD;  Location: [facility] ENDOSCOPY;  Service: Gastroenterology;  Laterality: N/A;   ESOPHAGOGASTRODUODENOSCOPY (EGD) WITH PROPOFOL N/A 10/09/2021   Procedure: ESOPHAGOGASTRODUODENOSCOPY (EGD) WITH PROPOFOL;  Surgeon: Pasty Spillers, MD;  Location: [facility] ENDOSCOPY;  Service: Endoscopy;  Laterality: N/A;   HOLEP-LASER ENUCLEATION OF THE PROSTATE WITH MORCELLATION N/A 12/08/2021   Procedure: HOLEP-LASER ENUCLEATION OF THE PROSTATE WITH MORCELLATION;  Surgeon: Vanna Scotland, MD;  Location: [facility] ORS;  Service: Urology;  Laterality: N/A;   TEE WITHOUT CARDIOVERSION N/A 10/09/2021   Procedure: TRANSESOPHAGEAL ECHOCARDIOGRAM (TEE);  Surgeon: Pasty Spillers, MD;  Location: [facility] ENDOSCOPY;  Service: Endoscopy;  Laterality: N/A;   WISDOM TOOTH EXTRACTION      FAMILY HISTORY: Family History  Problem Relation Age of Onset   Leukemia Mother    Colon cancer Father    Diabetes Father    Heart disease Father    Diabetes Brother     ADVANCED DIRECTIVES (Y/N):  N  HEALTH MAINTENANCE: Social History   Tobacco Use   Smoking status: Never   Smokeless tobacco: Never  Vaping Use   Vaping status: Never Used  Substance Use Topics   Alcohol use:  Yes    Comment: not at all   Drug use: No     Colonoscopy:  PAP:  Bone density:  Lipid panel:  No Known Allergies  Current  Outpatient Medications  Medication Sig Dispense Refill   acetaminophen (TYLENOL) 500 MG tablet Take 500-1,000 mg by mouth every 6 (six) hours as needed for mild pain.     Blood Glucose Monitoring Suppl (ONETOUCH VERIO FLEX SYSTEM) w/Device KIT See admin instructions.     cholecalciferol (VITAMIN D3) 25 MCG (1000 UNIT) tablet Take 1,000 Units by mouth daily.     citalopram (CELEXA) 40 MG tablet Take 40 mg by mouth daily.     ELIQUIS 5 MG TABS tablet Take 5 mg by mouth daily.     ibrutinib (IMBRUVICA) 420 MG tablet Take 1 tablet (420 mg total) by mouth daily. Take with a glass of water. 28 tablet 2   insulin detemir (LEVEMIR) 100 UNIT/ML injection Inject 15 Units into the skin 2 (two) times daily.     Insulin Pen Needle 31G X 5 MM MISC Use as directed 60 each 2   Lancets (FREESTYLE) lancets Use as instructed 100 each 2   LANTUS SOLOSTAR 100 UNIT/ML Solostar Pen Inject into the skin.     metFORMIN (GLUCOPHAGE) 1000 MG tablet Take 1,000 mg by mouth 2 (two) times daily with a meal.     metoprolol succinate (TOPROL-XL) 25 MG 24 hr tablet Take 25 mg by mouth daily.     Multiple Vitamin (MULTIVITAMIN WITH MINERALS) TABS tablet Take 1 tablet by mouth daily.     omeprazole (PRILOSEC) 20 MG capsule TAKE 1 CAPSULE BY MOUTH EVERY DAY 90 capsule 3   ONETOUCH VERIO test strip 2 (two) times daily. use for testing     sildenafil (REVATIO) 20 MG tablet Take 3-5 tablets 1 hr prior to intercourse as needed 60 tablet 12   No current facility-administered medications for this visit.    OBJECTIVE: Vitals:   09/01/23 1044  BP: 139/79  Pulse: 60  Temp: 98.6 F (37 C)  SpO2: 100%     Body mass index is 28.17 kg/m.    ECOG FS:1 - Symptomatic but completely ambulatory  General: Well-developed, well-nourished, no acute distress. Eyes: Pink conjunctiva, anicteric sclera. HEENT: Normocephalic, moist mucous membranes. Lungs: No audible wheezing or coughing. Heart: Regular rate and rhythm. Abdomen: Soft,  nontender, no obvious distention. Musculoskeletal: No edema, cyanosis, or clubbing. Neuro: Alert, answering all questions appropriately. Cranial nerves grossly intact. Skin: No rashes or petechiae noted. Psych: Normal affect.  LAB RESULTS:  Lab Results  Component Value Date   NA 137 09/01/2023   K 4.9 09/01/2023   CL 106 09/01/2023   CO2 25 09/01/2023   GLUCOSE 127 (H) 09/01/2023   BUN 15 09/01/2023   CREATININE 1.23 09/01/2023   CALCIUM 8.5 (L) 09/01/2023   PROT 5.7 (L) 09/01/2023   ALBUMIN 3.7 09/01/2023   AST 15 09/01/2023   ALT 13 09/01/2023   ALKPHOS 48 09/01/2023   BILITOT 0.8 09/01/2023   GFRNONAA >60 09/01/2023   GFRAA >60 07/05/2020    Lab Results  Component Value Date   WBC 9.0 09/01/2023   NEUTROABS 3.7 09/01/2023   HGB 14.5 09/01/2023   HCT 43.9 09/01/2023   MCV 84.6 09/01/2023   PLT 160 09/01/2023     STUDIES: No results found.  ASSESSMENT: CLL.  PLAN:    CLL: Patient underwent right axillary lymph node biopsy on December 24, 2021 confirmed persistent disease.  Bone  marrow biopsy in December 2017 revealed CLL with 13 q- cytogenetics.  Patient initially was on Imbruvica from November 2017 through November 2022 and then had a 1 year treatment holiday.  He restarted treatment in November 2023 secondary to worsening night sweats, fatigue, and slightly progressed axillary lymph nodes.  His most recent imaging on October 13, 2022 revealed essentially stable disease.  No intervention is needed at this time.  Continue Imbruvica as prescribed.  Return to clinic in 3 months with repeat laboratory can evaluation by clinical pharmacy.  Patient will then return to clinic in 6 months with repeat laboratory work and further evaluation. Neuropathy, foot drop: Chronic and unchanged.  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  Patient was previously given a referral to Occupational Therapy to help improve balance. Bladder outlet  obstruction secondary to BPH: Continue follow-up with urology as scheduled. Leukopenia: Resolved. Right lower lobe lung mass: Resolved.  CT scan results from March 23, 2022 reviewed independently with further decrease in size of lesion.  Previous PET scan revealed minimal SUV likely indicating a benign process.     Patient expressed understanding and was in agreement with this plan. He also understands that He can call clinic at any time with any questions, concerns, or complaints.    Jeralyn Ruths, MD   09/01/2023 12:27 PM

## 2023-09-01 NOTE — Progress Notes (Signed)
Patient says that he is doing well, he has no new questions or concerns for the doctor today.

## 2023-09-08 ENCOUNTER — Ambulatory Visit
Admission: RE | Admit: 2023-09-08 | Discharge: 2023-09-08 | Disposition: A | Discharge status: Home / Self Care | Payer: BC Managed Care – PPO | Source: Ambulatory Visit | Attending: Internal Medicine | Admitting: Internal Medicine

## 2023-09-08 ENCOUNTER — Other Ambulatory Visit (HOSPITAL_COMMUNITY): Payer: Self-pay

## 2023-09-08 DIAGNOSIS — I77810 Thoracic aortic ectasia: Secondary | ICD-10-CM | POA: Diagnosis present

## 2023-09-08 DIAGNOSIS — Z794 Long term (current) use of insulin: Secondary | ICD-10-CM | POA: Insufficient documentation

## 2023-09-08 DIAGNOSIS — E1165 Type 2 diabetes mellitus with hyperglycemia: Secondary | ICD-10-CM | POA: Insufficient documentation

## 2023-09-08 MED ORDER — IOHEXOL 350 MG/ML SOLN
100.0000 mL | Freq: Once | INTRAVENOUS | Status: AC | PRN
  Administered 2023-09-08: 100 mL via INTRAVENOUS

## 2023-10-12 ENCOUNTER — Other Ambulatory Visit (HOSPITAL_COMMUNITY): Payer: Self-pay

## 2023-10-15 ENCOUNTER — Encounter (HOSPITAL_COMMUNITY): Payer: Self-pay

## 2023-10-15 ENCOUNTER — Other Ambulatory Visit (HOSPITAL_COMMUNITY): Payer: Self-pay

## 2023-10-18 ENCOUNTER — Other Ambulatory Visit: Payer: Self-pay

## 2023-10-18 NOTE — Progress Notes (Signed)
Specialty Pharmacy Refill Coordination Note  Travis [name]. is a 72 y.o. male contacted today regarding refills of specialty medication(s) Ibrutinib   Patient requested Delivery  Delivery date:10/22/23  Verified address: [address] [place]  [facility] [place] [address]   Medication will be filled on 10/21/23.

## 2023-10-21 ENCOUNTER — Other Ambulatory Visit (HOSPITAL_COMMUNITY): Payer: Self-pay

## 2023-10-21 ENCOUNTER — Telehealth: Payer: Self-pay

## 2023-10-21 NOTE — Telephone Encounter (Signed)
Oral Oncology Patient Advocate Encounter  Re-authorization   Received notification that prior authorization for Imbruvica is due for renewal.   PA submitted on 10/21/23  Key [id number]  Status is pending     Ardeen Fillers, CPhT Oncology Pharmacy Patient Advocate  [facility]  [phone] (phone) [phone] (fax) 10/21/2023 8:28 AM

## 2023-10-21 NOTE — Telephone Encounter (Signed)
Oral Oncology Patient Advocate Encounter  Prior Authorization for Travis [name] has been approved.    PA# [id number]  Effective dates: 10/21/23 through 09/30/24  Patient may continue to fill with [facility] as they have been.    Ardeen Fillers, CPhT Oncology Pharmacy Patient Advocate  [facility]  [phone] (phone) [phone] (fax) 10/21/2023 9:42 AM

## 2023-11-10 ENCOUNTER — Other Ambulatory Visit: Payer: Self-pay

## 2023-11-12 ENCOUNTER — Other Ambulatory Visit: Payer: Self-pay

## 2023-11-12 ENCOUNTER — Other Ambulatory Visit: Payer: Self-pay | Admitting: Pharmacist

## 2023-11-12 DIAGNOSIS — C9110 Chronic lymphocytic leukemia of B-cell type not having achieved remission: Secondary | ICD-10-CM

## 2023-11-12 MED ORDER — IBRUTINIB 420 MG PO TABS
420.0000 mg | ORAL_TABLET | Freq: Every day | ORAL | 2 refills | Status: DC
  Filled 2023-11-12: qty 28, 28d supply, fill #0
  Filled 2023-12-06: qty 28, 28d supply, fill #1
  Filled 2024-01-04: qty 28, 28d supply, fill #2

## 2023-11-12 NOTE — Progress Notes (Signed)
Specialty Pharmacy Refill Coordination Note  Travis [name]. is a 72 y.o. male contacted today regarding refills of specialty medication(s) Ibrutinib   Patient requested Delivery   Delivery date: 11/15/23   Verified address: [address] [place], [place] [address]   Medication will be filled on 11/15/23 or 11/16/23.  Refill request pending. Reached out to patient about requested delivery date and patient is okay to receive on 11/16/23 or 11/17/23. Please call if delayed.

## 2023-12-02 ENCOUNTER — Inpatient Hospital Stay: Payer: BC Managed Care – PPO | Admitting: Pharmacist

## 2023-12-02 ENCOUNTER — Other Ambulatory Visit: Payer: Self-pay

## 2023-12-02 ENCOUNTER — Inpatient Hospital Stay: Payer: BC Managed Care – PPO | Attending: Oncology

## 2023-12-02 DIAGNOSIS — C9110 Chronic lymphocytic leukemia of B-cell type not having achieved remission: Secondary | ICD-10-CM

## 2023-12-02 DIAGNOSIS — Z9221 Personal history of antineoplastic chemotherapy: Secondary | ICD-10-CM | POA: Insufficient documentation

## 2023-12-02 LAB — CBC WITH DIFFERENTIAL/PLATELET
Abs Immature Granulocytes: 0.1 10*3/uL — ABNORMAL HIGH (ref 0.00–0.07)
Basophils Absolute: 0.1 10*3/uL (ref 0.0–0.1)
Basophils Relative: 1 %
Eosinophils Absolute: 0.3 10*3/uL (ref 0.0–0.5)
Eosinophils Relative: 5 %
HCT: 44.6 % (ref 39.0–52.0)
Hemoglobin: 14.8 g/dL (ref 13.0–17.0)
Immature Granulocytes: 2 %
Lymphocytes Relative: 35 %
Lymphs Abs: 2.4 10*3/uL (ref 0.7–4.0)
MCH: 27.9 pg (ref 26.0–34.0)
MCHC: 33.2 g/dL (ref 30.0–36.0)
MCV: 84.2 fL (ref 80.0–100.0)
Monocytes Absolute: 0.6 10*3/uL (ref 0.1–1.0)
Monocytes Relative: 9 %
Neutro Abs: 3.4 10*3/uL (ref 1.7–7.7)
Neutrophils Relative %: 48 %
Platelets: 150 10*3/uL (ref 150–400)
RBC: 5.3 MIL/uL (ref 4.22–5.81)
RDW: 12.8 % (ref 11.5–15.5)
WBC: 6.9 10*3/uL (ref 4.0–10.5)
nRBC: 0 % (ref 0.0–0.2)

## 2023-12-02 NOTE — Progress Notes (Signed)
Oral Chemotherapy Clinic [facility]  [phone] [phone]  Patient Care Team: Lynnea Ferrier, MD as PCP - General (Internal Medicine) Midge Minium, MD as Consulting Physician (Gastroenterology) Morene Crocker, MD as Referring Physician (Neurology) Jeralyn Ruths, MD as Consulting Physician (Oncology)   Name of the patient: Travis [name]  [id number]  1951-10-01   Date of visit: 12/02/23  HPI: Patient is a 72 y.o. male with CLL. Patient was previously on a ibrutinib treatment break but resumed therapy on 10/15/22.  Reason for Consult: Oral chemotherapy follow-up for ibrutinib therapy.   PAST MEDICAL HISTORY: Past Medical History:  Diagnosis Date   Anxiety    Depression    Diabetes mellitus without complication (HCC)    GERD (gastroesophageal reflux disease)    Leukemia (HCC)    chronic lymphocytic   Pneumonia     HEMATOLOGY/ONCOLOGY HISTORY:  Oncology History   No history exists.    ALLERGIES:  has No Known Allergies.  MEDICATIONS:  Current Outpatient Medications  Medication Sig Dispense Refill   acetaminophen (TYLENOL) 500 MG tablet Take 500-1,000 mg by mouth every 6 (six) hours as needed for mild pain.     Blood Glucose Monitoring Suppl (ONETOUCH VERIO FLEX SYSTEM) w/Device KIT See admin instructions.     cholecalciferol (VITAMIN D3) 25 MCG (1000 UNIT) tablet Take 1,000 Units by mouth daily.     citalopram (CELEXA) 40 MG tablet Take 40 mg by mouth daily.     ELIQUIS 5 MG TABS tablet Take 5 mg by mouth daily.     ibrutinib (IMBRUVICA) 420 MG tablet Take 1 tablet (420 mg total) by mouth daily. Take with a glass of water. 28 tablet 2   insulin detemir (LEVEMIR) 100 UNIT/ML injection Inject 15 Units into the skin 2 (two) times daily.     Insulin Pen Needle 31G X 5 MM MISC Use as directed 60 each 2   Lancets (FREESTYLE) lancets Use as instructed 100 each 2   LANTUS SOLOSTAR 100 UNIT/ML Solostar Pen Inject into the skin.      metFORMIN (GLUCOPHAGE) 1000 MG tablet Take 1,000 mg by mouth 2 (two) times daily with a meal.     metoprolol succinate (TOPROL-XL) 25 MG 24 hr tablet Take 25 mg by mouth daily.     Multiple Vitamin (MULTIVITAMIN WITH MINERALS) TABS tablet Take 1 tablet by mouth daily.     omeprazole (PRILOSEC) 20 MG capsule TAKE 1 CAPSULE BY MOUTH EVERY DAY 90 capsule 3   ONETOUCH VERIO test strip 2 (two) times daily. use for testing     sildenafil (REVATIO) 20 MG tablet Take 3-5 tablets 1 hr prior to intercourse as needed 60 tablet 12   No current facility-administered medications for this visit.    VITAL SIGNS: There were no vitals taken for this visit. There were no vitals filed for this visit.  Estimated body mass index is 28.17 kg/m as calculated from the following:   Height as of 12/24/22: 5\' 11"  (1.803 m).   Weight as of 09/01/23: 91.6 kg (202 lb).  LABS: CBC:    Component Value Date/Time   WBC 6.9 12/02/2023 1015   HGB 14.8 12/02/2023 1015   HCT 44.6 12/02/2023 1015   PLT 150 12/02/2023 1015   MCV 84.2 12/02/2023 1015   NEUTROABS 3.4 12/02/2023 1015   LYMPHSABS 2.4 12/02/2023 1015   MONOABS 0.6 12/02/2023 1015   EOSABS 0.3 12/02/2023 1015   BASOSABS 0.1 12/02/2023 1015   Comprehensive Metabolic  Panel:    Component Value Date/Time   NA 137 09/01/2023 1028   K 4.9 09/01/2023 1028   CL 106 09/01/2023 1028   CO2 25 09/01/2023 1028   BUN 15 09/01/2023 1028   CREATININE 1.23 09/01/2023 1028   GLUCOSE 127 (H) 09/01/2023 1028   CALCIUM 8.5 (L) 09/01/2023 1028   AST 15 09/01/2023 1028   ALT 13 09/01/2023 1028   ALKPHOS 48 09/01/2023 1028   BILITOT 0.8 09/01/2023 1028   PROT 5.7 (L) 09/01/2023 1028   ALBUMIN 3.7 09/01/2023 1028     Present during today's visit: patient only  Assessment and Plan: CBC reviewed, continue ibrutinib 420mg  daily   Oral Chemotherapy Side Effect/Intolerance:  Fatigue: present, but unchanged since last office visit No reported edema, rash, or nausea    Oral Chemotherapy Adherence: No missed doses reported No patient barriers to medication adherence identified.   New medications: None reported  Medication Access Issues: No issues, patient fills at [facility] (Specialty)  Patient expressed understanding and was in agreement with this plan. He also understands that He can call clinic at any time with any questions, concerns, or complaints.   Follow-up plan: RTC in 3 months  Thank you for allowing me to participate in the care of this very pleasant patient.   Time Total: 15 mins  Visit consisted of counseling and education on dealing with issues of symptom management in the setting of serious and potentially life-threatening illness.Greater than 50%  of this time was spent counseling and coordinating care related to the above assessment and plan.  Signed by: Remi Haggard, PharmD, BCPS, Nolon Bussing, CPP Hematology/Oncology Clinical Pharmacist Practitioner [place]/DB/AP Oral Chemotherapy Navigation Clinic [phone]  12/02/2023 10:31 AM

## 2023-12-06 ENCOUNTER — Other Ambulatory Visit (HOSPITAL_COMMUNITY): Payer: Self-pay

## 2023-12-06 ENCOUNTER — Other Ambulatory Visit: Payer: Self-pay

## 2023-12-06 NOTE — Progress Notes (Signed)
Specialty Pharmacy Refill Coordination Note  Travis [name]. is a 72 y.o. male contacted today regarding refills of specialty medication(s) Ibrutinib   Patient requested Delivery   Delivery date: 12/14/23   Verified address: [address]   Medication will be filled on 12/13/23.

## 2023-12-06 NOTE — Progress Notes (Signed)
Specialty Pharmacy Ongoing Clinical Assessment Note  Travis [name]. is a 72 y.o. male who is being followed by the specialty pharmacy service for RxSp Oncology   Patient's specialty medication(s) reviewed today: Ibrutinib   Missed doses in the last 4 weeks: 0   Patient/Caregiver asked additional questions regarding rash.  Patient believes rash is not related to Ibrutinib. Patient used detergent with downy and believes this caused the rash. Currently using topical HTC 1% cream.   Therapeutic benefit summary: Patient is achieving benefit   Adverse events/side effects summary: No adverse events/side effects   Patient's therapy is appropriate to: Continue    Goals Addressed             This Visit's Progress    Stabilization of disease       Patient is on track. Patient will maintain adherence         Follow up:  6 months  Bobette Mo Specialty Pharmacist

## 2023-12-13 ENCOUNTER — Other Ambulatory Visit: Payer: Self-pay

## 2023-12-30 ENCOUNTER — Other Ambulatory Visit (HOSPITAL_COMMUNITY): Payer: Self-pay

## 2024-01-03 ENCOUNTER — Other Ambulatory Visit (HOSPITAL_COMMUNITY): Payer: Self-pay

## 2024-01-03 ENCOUNTER — Encounter (HOSPITAL_COMMUNITY): Payer: Self-pay

## 2024-01-04 ENCOUNTER — Other Ambulatory Visit: Payer: Self-pay

## 2024-01-04 ENCOUNTER — Other Ambulatory Visit (HOSPITAL_COMMUNITY): Payer: Self-pay

## 2024-01-04 NOTE — Progress Notes (Signed)
 Patient had a question about a rash, I was on another call at that time.  Tried to return his call and had to LVM.

## 2024-01-04 NOTE — Progress Notes (Signed)
 Specialty Pharmacy Refill Coordination Note  Jordynn G Bok Jr. is a 73 y.o. male contacted today regarding refills of specialty medication(s) Ibrutinib  (IMBRUVICA )   Patient requested Delivery   Delivery date: 01/06/24   Verified address: [address]  [place],  [place] [address]   Medication will be filled on 01/05/24.   *Patient has updated insurance. Patient is aware of $0 copayment.*

## 2024-01-25 ENCOUNTER — Other Ambulatory Visit: Payer: Self-pay

## 2024-01-25 ENCOUNTER — Other Ambulatory Visit: Payer: Self-pay | Admitting: Pharmacist

## 2024-01-25 ENCOUNTER — Other Ambulatory Visit (HOSPITAL_COMMUNITY): Payer: Self-pay

## 2024-01-25 DIAGNOSIS — C9110 Chronic lymphocytic leukemia of B-cell type not having achieved remission: Secondary | ICD-10-CM

## 2024-01-25 MED ORDER — IBRUTINIB 420 MG PO TABS
420.0000 mg | ORAL_TABLET | Freq: Every day | ORAL | 2 refills | Status: DC
  Filled 2024-01-25: qty 28, 28d supply, fill #0
  Filled 2024-02-25: qty 28, 28d supply, fill #1
  Filled 2024-03-23: qty 28, 28d supply, fill #2

## 2024-01-25 NOTE — Progress Notes (Addendum)
Specialty Pharmacy Refill Coordination Note  Travis [name]. is a 73 y.o. male contacted today regarding refills of specialty medication(s) Ibrutinib [name])   Patient requested Delivery   Delivery date: 02/01/24   Verified address: [address]  [place],  [place] [address]   Medication will be filled on 01/31/24, pending refill approval.

## 2024-01-26 ENCOUNTER — Other Ambulatory Visit (HOSPITAL_COMMUNITY): Payer: Self-pay

## 2024-02-02 ENCOUNTER — Other Ambulatory Visit: Payer: Self-pay | Admitting: Gastroenterology

## 2024-02-24 ENCOUNTER — Other Ambulatory Visit (HOSPITAL_COMMUNITY): Payer: Self-pay

## 2024-02-25 ENCOUNTER — Other Ambulatory Visit: Payer: Self-pay

## 2024-02-25 ENCOUNTER — Other Ambulatory Visit (HOSPITAL_COMMUNITY): Payer: Self-pay

## 2024-02-25 NOTE — Progress Notes (Signed)
 Specialty Pharmacy Refill Coordination Note  Travis [name]. is a 73 y.o. male contacted today regarding refills of specialty medication(s) Ibrutinib (IMBRUVICA)   Patient requested (Patient-Rptd) Delivery   Delivery date: (Patient-Rptd) 03/01/24   Verified address: (Patient-Rptd) [address] [place], [place] [address]   Medication will be filled on 03.04.25.

## 2024-02-29 ENCOUNTER — Other Ambulatory Visit: Payer: BC Managed Care – PPO

## 2024-02-29 ENCOUNTER — Ambulatory Visit: Payer: BC Managed Care – PPO | Admitting: Oncology

## 2024-03-01 ENCOUNTER — Other Ambulatory Visit: Payer: Self-pay

## 2024-03-01 DIAGNOSIS — C9110 Chronic lymphocytic leukemia of B-cell type not having achieved remission: Secondary | ICD-10-CM

## 2024-03-02 ENCOUNTER — Encounter: Payer: Self-pay | Admitting: Oncology

## 2024-03-02 ENCOUNTER — Inpatient Hospital Stay: Payer: Self-pay | Attending: Oncology

## 2024-03-02 ENCOUNTER — Inpatient Hospital Stay: Payer: Self-pay | Admitting: Oncology

## 2024-03-02 ENCOUNTER — Other Ambulatory Visit: Payer: Self-pay | Admitting: Internal Medicine

## 2024-03-02 VITALS — BP 144/87 | HR 54 | Temp 96.9°F | Resp 18 | Wt 204.5 lb

## 2024-03-02 DIAGNOSIS — M21379 Foot drop, unspecified foot: Secondary | ICD-10-CM | POA: Insufficient documentation

## 2024-03-02 DIAGNOSIS — C9110 Chronic lymphocytic leukemia of B-cell type not having achieved remission: Secondary | ICD-10-CM

## 2024-03-02 DIAGNOSIS — R42 Dizziness and giddiness: Secondary | ICD-10-CM | POA: Diagnosis not present

## 2024-03-02 DIAGNOSIS — N401 Enlarged prostate with lower urinary tract symptoms: Secondary | ICD-10-CM | POA: Insufficient documentation

## 2024-03-02 DIAGNOSIS — Z794 Long term (current) use of insulin: Secondary | ICD-10-CM

## 2024-03-02 DIAGNOSIS — L299 Pruritus, unspecified: Secondary | ICD-10-CM | POA: Diagnosis not present

## 2024-03-02 DIAGNOSIS — G629 Polyneuropathy, unspecified: Secondary | ICD-10-CM | POA: Diagnosis not present

## 2024-03-02 DIAGNOSIS — I77810 Thoracic aortic ectasia: Secondary | ICD-10-CM

## 2024-03-02 LAB — CBC WITH DIFFERENTIAL (CANCER CENTER ONLY)
Abs Immature Granulocytes: 0.12 10*3/uL — ABNORMAL HIGH (ref 0.00–0.07)
Basophils Absolute: 0.1 10*3/uL (ref 0.0–0.1)
Basophils Relative: 1 %
Eosinophils Absolute: 0.3 10*3/uL (ref 0.0–0.5)
Eosinophils Relative: 5 %
HCT: 45.6 % (ref 39.0–52.0)
Hemoglobin: 15 g/dL (ref 13.0–17.0)
Immature Granulocytes: 2 %
Lymphocytes Relative: 40 %
Lymphs Abs: 3 10*3/uL (ref 0.7–4.0)
MCH: 27.6 pg (ref 26.0–34.0)
MCHC: 32.9 g/dL (ref 30.0–36.0)
MCV: 83.8 fL (ref 80.0–100.0)
Monocytes Absolute: 0.7 10*3/uL (ref 0.1–1.0)
Monocytes Relative: 9 %
Neutro Abs: 3.4 10*3/uL (ref 1.7–7.7)
Neutrophils Relative %: 43 %
Platelet Count: 152 10*3/uL (ref 150–400)
RBC: 5.44 MIL/uL (ref 4.22–5.81)
RDW: 12.9 % (ref 11.5–15.5)
WBC Count: 7.6 10*3/uL (ref 4.0–10.5)
nRBC: 0 % (ref 0.0–0.2)

## 2024-03-02 LAB — CMP (CANCER CENTER ONLY)
ALT: 17 U/L (ref 0–44)
AST: 19 U/L (ref 15–41)
Albumin: 3.8 g/dL (ref 3.5–5.0)
Alkaline Phosphatase: 53 U/L (ref 38–126)
Anion gap: 8 (ref 5–15)
BUN: 20 mg/dL (ref 8–23)
CO2: 24 mmol/L (ref 22–32)
Calcium: 8.7 mg/dL — ABNORMAL LOW (ref 8.9–10.3)
Chloride: 105 mmol/L (ref 98–111)
Creatinine: 1.24 mg/dL (ref 0.61–1.24)
GFR, Estimated: >60 mL/min (ref >60)
Glucose, Bld: 127 mg/dL — ABNORMAL HIGH (ref 70–99)
Potassium: 4.5 mmol/L (ref 3.5–5.1)
Sodium: 137 mmol/L (ref 135–145)
Total Bilirubin: 1 mg/dL (ref 0.0–1.2)
Total Protein: 6.3 g/dL — ABNORMAL LOW (ref 6.5–8.1)

## 2024-03-02 NOTE — Progress Notes (Signed)
 [facility]  Telephone:([phone] Fax:([phone]  ID: Travis [name]. OB: 12-Aug-1951  MR#: [id number]  DGU#:[id number]  Patient Care Team: Lynnea Ferrier, MD as PCP - General (Internal Medicine) Midge Minium, MD as Consulting Physician (Gastroenterology) Morene Crocker, MD as Referring Physician (Neurology) Jeralyn Ruths, MD as Consulting Physician (Oncology)  CHIEF COMPLAINT: CLL.  INTERVAL HISTORY: Patient returns to clinic today for repeat laboratory work, further evaluation, and continuation of Imbruvica.  He has noticed increased dizziness over the past several weeks and his primary care physician referred him to neurology for further evaluation.  He also has a pruritic rash of unknown etiology.  He otherwise feels well.  He continues to tolerate Imbruvica without significant side effects.  He denies any fevers, night sweats, or unintentional weight loss.  He has no new neurologic complaints.  He denies any chest pain, shortness of breath, cough, or hemoptysis.  He denies any nausea, vomiting, constipation, or diarrhea. He has no urinary complaints.  Patient offers no further specific complaints today.  REVIEW OF SYSTEMS:   Review of Systems  Constitutional: Negative.  Negative for diaphoresis, fever, malaise/fatigue and weight loss.  Respiratory: Negative.  Negative for cough and shortness of breath.   Cardiovascular: Negative.  Negative for chest pain and leg swelling.  Gastrointestinal:  Negative for abdominal pain, blood in stool, melena, nausea and vomiting.  Genitourinary: Negative.  Negative for dysuria.  Musculoskeletal: Negative.  Negative for back pain.  Skin:  Positive for itching and rash.  Neurological:  Positive for dizziness. Negative for tingling, sensory change, focal weakness, weakness and headaches.  Psychiatric/Behavioral: Negative.  Negative for depression. The patient is not nervous/anxious and does not have insomnia.      As per HPI. Otherwise, a complete review of systems is negative.  PAST MEDICAL HISTORY: Past Medical History:  Diagnosis Date   Anxiety    Depression    Diabetes mellitus without complication (HCC)    GERD (gastroesophageal reflux disease)    Leukemia (HCC)    chronic lymphocytic   Pneumonia     PAST SURGICAL HISTORY: Past Surgical History:  Procedure Laterality Date   COLONOSCOPY     ESOPHAGOGASTRODUODENOSCOPY (EGD) WITH PROPOFOL N/A 03/14/2018   Procedure: ESOPHAGOGASTRODUODENOSCOPY (EGD) WITH PROPOFOL;  Surgeon: Wyline Mood, MD;  Location: [facility] ENDOSCOPY;  Service: Gastroenterology;  Laterality: N/A;   ESOPHAGOGASTRODUODENOSCOPY (EGD) WITH PROPOFOL N/A 06/13/2018   Procedure: ESOPHAGOGASTRODUODENOSCOPY (EGD) WITH PROPOFOL;  Surgeon: Wyline Mood, MD;  Location: [facility] ENDOSCOPY;  Service: Gastroenterology;  Laterality: N/A;   ESOPHAGOGASTRODUODENOSCOPY (EGD) WITH PROPOFOL N/A 10/09/2021   Procedure: ESOPHAGOGASTRODUODENOSCOPY (EGD) WITH PROPOFOL;  Surgeon: Pasty Spillers, MD;  Location: [facility] ENDOSCOPY;  Service: Endoscopy;  Laterality: N/A;   HOLEP-LASER ENUCLEATION OF THE PROSTATE WITH MORCELLATION N/A 12/08/2021   Procedure: HOLEP-LASER ENUCLEATION OF THE PROSTATE WITH MORCELLATION;  Surgeon: Vanna Scotland, MD;  Location: [facility] ORS;  Service: Urology;  Laterality: N/A;   TEE WITHOUT CARDIOVERSION N/A 10/09/2021   Procedure: TRANSESOPHAGEAL ECHOCARDIOGRAM (TEE);  Surgeon: Pasty Spillers, MD;  Location: [facility] ENDOSCOPY;  Service: Endoscopy;  Laterality: N/A;   WISDOM TOOTH EXTRACTION      FAMILY HISTORY: Family History  Problem Relation Age of Onset   Leukemia Mother    Colon cancer Father    Diabetes Father    Heart disease Father    Diabetes Brother     ADVANCED DIRECTIVES (Y/N):  N  HEALTH MAINTENANCE: Social History   Tobacco Use   Smoking status:  Never   Smokeless tobacco: Never  Vaping Use   Vaping status: Never Used  Substance Use Topics    Alcohol use: Yes    Comment: not at all   Drug use: No     Colonoscopy:  PAP:  Bone density:  Lipid panel:  No Known Allergies  Current Outpatient Medications  Medication Sig Dispense Refill   acetaminophen (TYLENOL) 500 MG tablet Take 500-1,000 mg by mouth every 6 (six) hours as needed for mild pain.     Blood Glucose Monitoring Suppl (ONETOUCH VERIO FLEX SYSTEM) w/Device KIT See admin instructions.     cholecalciferol (VITAMIN D3) 25 MCG (1000 UNIT) tablet Take 1,000 Units by mouth daily.     citalopram (CELEXA) 40 MG tablet Take 40 mg by mouth daily.     ELIQUIS 5 MG TABS tablet Take 5 mg by mouth daily.     ibrutinib (IMBRUVICA) 420 MG tablet Take 1 tablet (420 mg total) by mouth daily. Take with a glass of water. 28 tablet 2   insulin detemir (LEVEMIR) 100 UNIT/ML injection Inject 15 Units into the skin 2 (two) times daily.     Insulin Pen Needle 31G X 5 MM MISC Use as directed 60 each 2   Lancets (FREESTYLE) lancets Use as instructed 100 each 2   metFORMIN (GLUCOPHAGE) 1000 MG tablet Take 1,000 mg by mouth 2 (two) times daily with a meal.     metoprolol succinate (TOPROL-XL) 25 MG 24 hr tablet Take 25 mg by mouth daily.     Multiple Vitamin (MULTIVITAMIN WITH MINERALS) TABS tablet Take 1 tablet by mouth daily.     omeprazole (PRILOSEC) 20 MG capsule TAKE 1 CAPSULE BY MOUTH EVERY DAY 90 capsule 3   ONETOUCH VERIO test strip 2 (two) times daily. use for testing     sildenafil (REVATIO) 20 MG tablet Take 3-5 tablets 1 hr prior to intercourse as needed 60 tablet 12   No current facility-administered medications for this visit.    OBJECTIVE: Vitals:   03/02/24 0903  BP: (!) 144/87  Pulse: (!) 54  Resp: 18  Temp: (!) 96.9 F (36.1 C)  SpO2: 100%      Body mass index is 28.52 kg/m.    ECOG FS:1 - Symptomatic but completely ambulatory  General: Well-developed, well-nourished, no acute distress. Eyes: Pink conjunctiva, anicteric sclera. HEENT: Normocephalic, moist mucous  membranes. Lungs: No audible wheezing or coughing. Heart: Regular rate and rhythm. Abdomen: Soft, nontender, no obvious distention. Musculoskeletal: No edema, cyanosis, or clubbing. Neuro: Alert, answering all questions appropriately. Cranial nerves grossly intact. Skin: No rashes or petechiae noted. Psych: Normal affect.  LAB RESULTS:  Lab Results  Component Value Date   NA 137 03/02/2024   K 4.5 03/02/2024   CL 105 03/02/2024   CO2 24 03/02/2024   GLUCOSE 127 (H) 03/02/2024   BUN 20 03/02/2024   CREATININE 1.24 03/02/2024   CALCIUM 8.7 (L) 03/02/2024   PROT 6.3 (L) 03/02/2024   ALBUMIN 3.8 03/02/2024   AST 19 03/02/2024   ALT 17 03/02/2024   ALKPHOS 53 03/02/2024   BILITOT 1.0 03/02/2024   GFRNONAA >60 03/02/2024   GFRAA >60 07/05/2020    Lab Results  Component Value Date   WBC 7.6 03/02/2024   NEUTROABS 3.4 03/02/2024   HGB 15.0 03/02/2024   HCT 45.6 03/02/2024   MCV 83.8 03/02/2024   PLT 152 03/02/2024     STUDIES: No results found.  ASSESSMENT: CLL.  PLAN:    CLL:  Patient underwent right axillary lymph node biopsy on December 24, 2021 confirmed persistent disease.  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  Patient initially was on Imbruvica from November 2017 through November 2022 and then had a 1 year treatment holiday.  He restarted treatment in November 2023 secondary to worsening night sweats, fatigue, and slightly progressed axillary lymph nodes.  His most recent imaging with chest CT on September 08, 2023 did not reveal any significant lymphadenopathy.  No intervention is needed.  No further imaging is necessary unless there is suspicion of progression of disease.  Continue Imbruvica as prescribed.  Return to clinic in 3 months with repeat laboratory work and evaluation by clinical pharmacy.  Patient will then return to clinic in 6 months with repeat laboratory work, further evaluation, and continuation of treatment.    Neuropathy, foot drop: Chronic and unchanged.  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  Patient was previously given a referral to Occupational Therapy to help improve balance. Bladder outlet obstruction secondary to BPH: Continue follow-up with urology as scheduled. Right lower lobe lung mass: Resolved.  CT scan results from March 23, 2022 reviewed independently with further decrease in size of lesion.  Previous PET scan revealed minimal SUV likely indicating a benign process.   Dizziness: Patient reports he has a appointment with neurology in May 2025. Rash: Unclear etiology.  Continue topical steroids as needed.  Also recommended Benadryl at night as needed.  Follow-up with dermatology if symptoms do not resolve.   Patient expressed understanding and was in agreement with this plan. He also understands that He can call clinic at any time with any questions, concerns, or complaints.    Jeralyn Ruths, MD   03/02/2024 9:42 AM

## 2024-03-02 NOTE — Progress Notes (Signed)
 Patient states that he has had itching on his legs and back for about the past 3 weeks. He has also been having increased dizziness recently which he discussed with PCP yesterday.

## 2024-03-08 ENCOUNTER — Ambulatory Visit
Admission: RE | Admit: 2024-03-08 | Discharge: 2024-03-08 | Disposition: A | Discharge status: Home / Self Care | Source: Ambulatory Visit | Attending: Internal Medicine | Admitting: Internal Medicine

## 2024-03-08 DIAGNOSIS — Z794 Long term (current) use of insulin: Secondary | ICD-10-CM | POA: Diagnosis present

## 2024-03-08 DIAGNOSIS — E1165 Type 2 diabetes mellitus with hyperglycemia: Secondary | ICD-10-CM | POA: Insufficient documentation

## 2024-03-08 DIAGNOSIS — I77810 Thoracic aortic ectasia: Secondary | ICD-10-CM | POA: Diagnosis present

## 2024-03-08 MED ORDER — IOHEXOL 350 MG/ML SOLN
75.0000 mL | Freq: Once | INTRAVENOUS | Status: AC | PRN
  Administered 2024-03-08: 75 mL via INTRAVENOUS

## 2024-03-23 ENCOUNTER — Other Ambulatory Visit: Payer: Self-pay | Admitting: Pharmacy Technician

## 2024-03-23 ENCOUNTER — Other Ambulatory Visit: Payer: Self-pay

## 2024-03-23 NOTE — Progress Notes (Signed)
 Specialty Pharmacy Refill Coordination Note  Travis [name]. is a 73 y.o. male contacted today regarding refills of specialty medication(s) Ibrutinib [facility])   Patient requested (Patient-Rptd) Delivery   Delivery date: (Patient-Rptd) 03/31/24   Verified address: (Patient-Rptd) [address], [place], [place] [address]   Medication will be filled on 03/30/24.

## 2024-04-19 ENCOUNTER — Other Ambulatory Visit: Payer: Self-pay

## 2024-04-21 ENCOUNTER — Other Ambulatory Visit: Payer: Self-pay

## 2024-04-21 ENCOUNTER — Other Ambulatory Visit (HOSPITAL_COMMUNITY): Payer: Self-pay

## 2024-04-21 ENCOUNTER — Other Ambulatory Visit: Payer: Self-pay | Admitting: Pharmacy Technician

## 2024-04-21 ENCOUNTER — Other Ambulatory Visit: Payer: Self-pay | Admitting: Pharmacist

## 2024-04-21 DIAGNOSIS — C9110 Chronic lymphocytic leukemia of B-cell type not having achieved remission: Secondary | ICD-10-CM

## 2024-04-21 MED ORDER — IBRUTINIB 420 MG PO TABS
420.0000 mg | ORAL_TABLET | Freq: Every day | ORAL | 2 refills | Status: DC
  Filled 2024-04-21 (×2): qty 28, 28d supply, fill #0
  Filled 2024-05-18: qty 28, 28d supply, fill #1
  Filled 2024-06-26: qty 28, 28d supply, fill #2

## 2024-04-21 NOTE — Progress Notes (Signed)
 Specialty Pharmacy Refill Coordination Note  Travis G Dion Jr. is a 73 y.o. male contacted today regarding refills of specialty medication(s) Ibrutinib  (IMBRUVICA )   Patient requested (Patient-Rptd) Delivery   Delivery date: (Patient-Rptd) 04/28/24   Verified address: (Patient-Rptd) [address], [place] [address]   Medication will be filled on 04/27/24.   RR sent to MD

## 2024-04-27 ENCOUNTER — Other Ambulatory Visit: Payer: Self-pay

## 2024-05-18 ENCOUNTER — Other Ambulatory Visit (HOSPITAL_COMMUNITY): Payer: Self-pay | Admitting: Pharmacy Technician

## 2024-05-18 ENCOUNTER — Other Ambulatory Visit (HOSPITAL_COMMUNITY): Payer: Self-pay

## 2024-05-18 ENCOUNTER — Encounter (HOSPITAL_COMMUNITY): Payer: Self-pay

## 2024-05-18 NOTE — Progress Notes (Signed)
 Specialty Pharmacy Refill Coordination Note  Travis G Laffoon Jr. is a 73 y.o. male contacted today regarding refills of specialty medication(s) Ibrutinib  (IMBRUVICA )   Patient requested Delivery   Delivery date: 05/26/24 (No delivery on weekends.)   Verified address: [address], [place], [place] [address]   Medication will be filled on 05/25/24.   Sent patient mychart for new delivery date 05/26/24. No weekend delivery for 05/27/24.

## 2024-05-25 ENCOUNTER — Other Ambulatory Visit: Payer: Self-pay

## 2024-05-29 ENCOUNTER — Other Ambulatory Visit: Payer: Self-pay | Admitting: Neurology

## 2024-05-29 DIAGNOSIS — R413 Other amnesia: Secondary | ICD-10-CM

## 2024-05-30 ENCOUNTER — Ambulatory Visit
Admission: RE | Admit: 2024-05-30 | Discharge: 2024-05-30 | Disposition: A | Discharge status: Home / Self Care | Source: Ambulatory Visit | Attending: Neurology | Admitting: Neurology

## 2024-05-30 DIAGNOSIS — R413 Other amnesia: Secondary | ICD-10-CM | POA: Diagnosis present

## 2024-06-01 ENCOUNTER — Other Ambulatory Visit: Payer: Self-pay

## 2024-06-02 ENCOUNTER — Other Ambulatory Visit: Payer: Self-pay | Admitting: *Deleted

## 2024-06-02 ENCOUNTER — Other Ambulatory Visit: Payer: Self-pay

## 2024-06-02 DIAGNOSIS — C9110 Chronic lymphocytic leukemia of B-cell type not having achieved remission: Secondary | ICD-10-CM

## 2024-06-02 NOTE — Progress Notes (Signed)
 Specialty Pharmacy Ongoing Clinical Assessment Note  Travis [name]. is a 73 y.o. male who is being followed by the specialty pharmacy service for RxSp Oncology   Patient's specialty medication(s) reviewed today: Ibrutinib  (IMBRUVICA )   Missed doses in the last 4 weeks: 0   Patient/Caregiver did not have any additional questions or concerns.   Therapeutic benefit summary: Patient is achieving benefit   Adverse events/side effects summary: No adverse events/side effects   Patient's therapy is appropriate to: Continue    Goals Addressed             This Visit's Progress    Stabilization of disease   On track    Patient is on track. Patient will maintain adherence Per office notes on 36/25, patient's most recent imaging with chest CT on September 08, 2023 did not reveal any significant lymphadenopathy and MD  feels that no further imaging is needed unless there is suspicion of disease progression.         Follow up: 6 months  [facility]

## 2024-06-05 ENCOUNTER — Inpatient Hospital Stay: Payer: Self-pay

## 2024-06-05 ENCOUNTER — Telehealth: Payer: Self-pay | Admitting: *Deleted

## 2024-06-05 ENCOUNTER — Inpatient Hospital Stay: Payer: Self-pay | Admitting: Pharmacist

## 2024-06-05 NOTE — Telephone Encounter (Signed)
 I called the patient just now and said that he will be coming back next week On June 16 at 10:00 for labs and 1030 to see the pharmacist.  Patient said that is great for him

## 2024-06-05 NOTE — Telephone Encounter (Signed)
 Patient has a cough over the weekend and wanted to see if he needs to come in today or reschedule it to next week.  He was to see Alyson today with ibrutinib  and has labs

## 2024-06-12 ENCOUNTER — Inpatient Hospital Stay: Attending: Oncology

## 2024-06-12 ENCOUNTER — Inpatient Hospital Stay: Admitting: Pharmacist

## 2024-06-23 ENCOUNTER — Other Ambulatory Visit (HOSPITAL_COMMUNITY): Payer: Self-pay

## 2024-06-25 ENCOUNTER — Encounter (INDEPENDENT_AMBULATORY_CARE_PROVIDER_SITE_OTHER): Payer: Self-pay

## 2024-06-26 ENCOUNTER — Other Ambulatory Visit: Payer: Self-pay

## 2024-06-26 ENCOUNTER — Other Ambulatory Visit (HOSPITAL_COMMUNITY): Payer: Self-pay

## 2024-06-26 NOTE — Progress Notes (Signed)
 Specialty Pharmacy Refill Coordination Note  Travis [name]. is a 73 y.o. male contacted today regarding refills of specialty medication(s) Ibrutinib  (IMBRUVICA )   Patient requested Delivery   Delivery date: 06/28/24   Verified address: [address] [place], [place] [address]   Medication will be filled on 06/27/24.

## 2024-06-26 NOTE — Progress Notes (Signed)
 Clinical Intervention Note  Clinical Intervention Notes: Patient reports starting Seroquel, no DDIs were identified with Imbruvica .   Clinical Intervention Outcomes: Prevention of an adverse drug event   Travis [name] Travis [name] Blair Karel Santa

## 2024-07-14 ENCOUNTER — Encounter (INDEPENDENT_AMBULATORY_CARE_PROVIDER_SITE_OTHER): Payer: Self-pay

## 2024-07-14 ENCOUNTER — Other Ambulatory Visit (HOSPITAL_COMMUNITY): Payer: Self-pay

## 2024-07-14 ENCOUNTER — Other Ambulatory Visit: Payer: Self-pay

## 2024-07-14 ENCOUNTER — Other Ambulatory Visit: Payer: Self-pay | Admitting: Pharmacist

## 2024-07-14 DIAGNOSIS — C9110 Chronic lymphocytic leukemia of B-cell type not having achieved remission: Secondary | ICD-10-CM

## 2024-07-14 MED ORDER — IBRUTINIB 420 MG PO TABS
420.0000 mg | ORAL_TABLET | Freq: Every day | ORAL | 2 refills | Status: DC
  Filled 2024-07-14 (×2): qty 28, 28d supply, fill #0
  Filled 2024-08-08: qty 28, 28d supply, fill #1
  Filled 2024-09-18: qty 28, 28d supply, fill #2

## 2024-07-14 NOTE — Progress Notes (Signed)
 Specialty Pharmacy Refill Coordination Note  MyChart Questionnaire Submission  Travis G Maddy Jr. is a 73 y.o. male contacted today regarding refills of specialty medication(s) Imbruvica .  Doses on hand: (Patient-Rptd) 18   Patient requested: (Patient-Rptd) Delivery   Delivery date: 07/18/24   Verified address: (Patient-Rptd) [address] [place] , [place], [address]  Medication will be filled on 07/17/24.

## 2024-07-17 ENCOUNTER — Other Ambulatory Visit: Payer: Self-pay

## 2024-07-17 NOTE — Progress Notes (Signed)
 Clinical Intervention Note  Clinical Intervention Notes: Patient reports starting Shilajit, no DDIs were detected with the Imbruvica .   Clinical Intervention Outcomes: Prevention of an adverse drug event   Silvano LOISE Dolly Specialty Pharmacist

## 2024-07-25 ENCOUNTER — Other Ambulatory Visit: Payer: Self-pay | Admitting: Medical Genetics

## 2024-07-27 ENCOUNTER — Other Ambulatory Visit
Admission: RE | Admit: 2024-07-27 | Discharge: 2024-07-27 | Disposition: A | Discharge status: Home / Self Care | Payer: Self-pay | Source: Ambulatory Visit | Attending: Medical Genetics | Admitting: Medical Genetics

## 2024-08-07 ENCOUNTER — Encounter (INDEPENDENT_AMBULATORY_CARE_PROVIDER_SITE_OTHER): Payer: Self-pay

## 2024-08-08 ENCOUNTER — Other Ambulatory Visit: Payer: Self-pay

## 2024-08-08 ENCOUNTER — Other Ambulatory Visit (HOSPITAL_COMMUNITY): Payer: Self-pay

## 2024-08-08 LAB — GENECONNECT MOLECULAR SCREEN: Genetic Analysis Overall Interpretation: NEGATIVE

## 2024-08-08 NOTE — Progress Notes (Signed)
 Specialty Pharmacy Refill Coordination Note  MyChart Questionnaire Submission  Travis G Frye Jr. is a 73 y.o. male contacted today regarding refills of specialty medication(s) Imbruvica .  Doses on hand: (Patient-Rptd) 24   Patient requested: (Patient-Rptd) Delivery   Delivery date: 08/10/24   Verified address: [address], [place], [place] [address]  Medication will be filled on 08/09/24.

## 2024-08-18 ENCOUNTER — Other Ambulatory Visit: Payer: Self-pay

## 2024-08-30 ENCOUNTER — Other Ambulatory Visit (HOSPITAL_COMMUNITY): Payer: Self-pay

## 2024-09-04 ENCOUNTER — Inpatient Hospital Stay: Payer: Self-pay | Attending: Oncology

## 2024-09-04 ENCOUNTER — Inpatient Hospital Stay (HOSPITAL_BASED_OUTPATIENT_CLINIC_OR_DEPARTMENT_OTHER): Payer: Self-pay | Admitting: Oncology

## 2024-09-04 ENCOUNTER — Encounter: Payer: Self-pay | Admitting: Oncology

## 2024-09-04 VITALS — BP 136/87 | HR 57 | Temp 96.3°F | Resp 16 | Wt 199.0 lb

## 2024-09-04 DIAGNOSIS — M21379 Foot drop, unspecified foot: Secondary | ICD-10-CM | POA: Insufficient documentation

## 2024-09-04 DIAGNOSIS — C9110 Chronic lymphocytic leukemia of B-cell type not having achieved remission: Secondary | ICD-10-CM | POA: Insufficient documentation

## 2024-09-04 DIAGNOSIS — F0280 Dementia in other diseases classified elsewhere without behavioral disturbance: Secondary | ICD-10-CM | POA: Insufficient documentation

## 2024-09-04 DIAGNOSIS — G629 Polyneuropathy, unspecified: Secondary | ICD-10-CM | POA: Insufficient documentation

## 2024-09-04 DIAGNOSIS — G3183 Dementia with Lewy bodies: Secondary | ICD-10-CM | POA: Diagnosis not present

## 2024-09-04 DIAGNOSIS — N289 Disorder of kidney and ureter, unspecified: Secondary | ICD-10-CM | POA: Diagnosis not present

## 2024-09-04 DIAGNOSIS — Z806 Family history of leukemia: Secondary | ICD-10-CM | POA: Insufficient documentation

## 2024-09-04 DIAGNOSIS — N320 Bladder-neck obstruction: Secondary | ICD-10-CM | POA: Diagnosis not present

## 2024-09-04 DIAGNOSIS — N401 Enlarged prostate with lower urinary tract symptoms: Secondary | ICD-10-CM | POA: Diagnosis not present

## 2024-09-04 DIAGNOSIS — Z800 Family history of malignant neoplasm of digestive organs: Secondary | ICD-10-CM | POA: Insufficient documentation

## 2024-09-04 LAB — CBC WITH DIFFERENTIAL/PLATELET
Abs Immature Granulocytes: 0.1 K/uL — ABNORMAL HIGH (ref 0.00–0.07)
Basophils Absolute: 0.1 K/uL (ref 0.0–0.1)
Basophils Relative: 1 %
Eosinophils Absolute: 0.3 K/uL (ref 0.0–0.5)
Eosinophils Relative: 5 %
HCT: 44.4 % (ref 39.0–52.0)
Hemoglobin: 14.5 g/dL (ref 13.0–17.0)
Immature Granulocytes: 1 %
Lymphocytes Relative: 32 %
Lymphs Abs: 2.3 K/uL (ref 0.7–4.0)
MCH: 27.3 pg (ref 26.0–34.0)
MCHC: 32.7 g/dL (ref 30.0–36.0)
MCV: 83.6 fL (ref 80.0–100.0)
Monocytes Absolute: 0.9 K/uL (ref 0.1–1.0)
Monocytes Relative: 12 %
Neutro Abs: 3.5 K/uL (ref 1.7–7.7)
Neutrophils Relative %: 49 %
Platelets: 164 K/uL (ref 150–400)
RBC: 5.31 MIL/uL (ref 4.22–5.81)
RDW: 13.1 % (ref 11.5–15.5)
WBC: 7.2 K/uL (ref 4.0–10.5)
nRBC: 0 % (ref 0.0–0.2)

## 2024-09-04 LAB — CMP (CANCER CENTER ONLY)
ALT: 11 U/L (ref 0–44)
AST: 15 U/L (ref 15–41)
Albumin: 3.8 g/dL (ref 3.5–5.0)
Alkaline Phosphatase: 63 U/L (ref 38–126)
Anion gap: 9 (ref 5–15)
BUN: 21 mg/dL (ref 8–23)
CO2: 24 mmol/L (ref 22–32)
Calcium: 8.6 mg/dL — ABNORMAL LOW (ref 8.9–10.3)
Chloride: 103 mmol/L (ref 98–111)
Creatinine: 1.37 mg/dL — ABNORMAL HIGH (ref 0.61–1.24)
GFR, Estimated: 55 mL/min — ABNORMAL LOW (ref >60)
Glucose, Bld: 124 mg/dL — ABNORMAL HIGH (ref 70–99)
Potassium: 4.5 mmol/L (ref 3.5–5.1)
Sodium: 136 mmol/L (ref 135–145)
Total Bilirubin: 0.8 mg/dL (ref 0.0–1.2)
Total Protein: 6.3 g/dL — ABNORMAL LOW (ref 6.5–8.1)

## 2024-09-04 NOTE — Progress Notes (Signed)
 [facility]  [phone] [phone]  ID: Travis G Makela Jr. OB: 07-16-1951  MR#: [id number]  RDW#:[id number]  Patient Care Team: Fernande Ophelia JINNY DOUGLAS, MD as PCP - General (Internal Medicine) Jinny Carmine, MD as Consulting Physician (Gastroenterology) Lane Arthea BRAVO, MD as Referring Physician (Neurology) Jacobo Evalene JINNY, MD as Consulting Physician (Oncology)  CHIEF COMPLAINT: CLL.  INTERVAL HISTORY: Patient returns to clinic today for repeat laboratory work, further evaluation, and continuation Imbruvica .  He reports he has recently been diagnosed with Parkinson's and Lewy body dementia and is actively being managed by neurology.  He otherwise feels well.  He continues to tolerate Imbruvica  without significant side effects.  He denies any fevers, night sweats, or unintentional weight loss.  He has no new neurologic complaints.  He denies any chest pain, shortness of breath, cough, or hemoptysis.  He denies any nausea, vomiting, constipation, or diarrhea. He has no urinary complaints.  Patient offers no further specific complaints today.  REVIEW OF SYSTEMS:   Review of Systems  Constitutional: Negative.  Negative for diaphoresis, fever, malaise/fatigue and weight loss.  Respiratory: Negative.  Negative for cough and shortness of breath.   Cardiovascular: Negative.  Negative for chest pain and leg swelling.  Gastrointestinal:  Negative for abdominal pain, blood in stool, melena, nausea and vomiting.  Genitourinary: Negative.  Negative for dysuria.  Musculoskeletal: Negative.  Negative for back pain.  Skin: Negative.  Negative for itching and rash.  Neurological:  Positive for dizziness and weakness. Negative for tingling, sensory change, focal weakness and headaches.  Psychiatric/Behavioral: Negative.  Negative for depression. The patient is not nervous/anxious and does not have insomnia.     As per HPI. Otherwise, a complete review of systems is  negative.  PAST MEDICAL HISTORY: Past Medical History:  Diagnosis Date   Anxiety    Depression    Diabetes mellitus without complication (HCC)    GERD (gastroesophageal reflux disease)    Leukemia (HCC)    chronic lymphocytic   Pneumonia     PAST SURGICAL HISTORY: Past Surgical History:  Procedure Laterality Date   COLONOSCOPY     ESOPHAGOGASTRODUODENOSCOPY (EGD) WITH PROPOFOL  N/A 03/14/2018   Procedure: ESOPHAGOGASTRODUODENOSCOPY (EGD) WITH PROPOFOL ;  Surgeon: Therisa Bi, MD;  Location: [facility] ENDOSCOPY;  Service: Gastroenterology;  Laterality: N/A;   ESOPHAGOGASTRODUODENOSCOPY (EGD) WITH PROPOFOL  N/A 06/13/2018   Procedure: ESOPHAGOGASTRODUODENOSCOPY (EGD) WITH PROPOFOL ;  Surgeon: Therisa Bi, MD;  Location: [facility] ENDOSCOPY;  Service: Gastroenterology;  Laterality: N/A;   ESOPHAGOGASTRODUODENOSCOPY (EGD) WITH PROPOFOL  N/A 10/09/2021   Procedure: ESOPHAGOGASTRODUODENOSCOPY (EGD) WITH PROPOFOL ;  Surgeon: Janalyn Keene NOVAK, MD;  Location: [facility] ENDOSCOPY;  Service: Endoscopy;  Laterality: N/A;   HOLEP-LASER ENUCLEATION OF THE PROSTATE WITH MORCELLATION N/A 12/08/2021   Procedure: HOLEP-LASER ENUCLEATION OF THE PROSTATE WITH MORCELLATION;  Surgeon: Penne Knee, MD;  Location: [facility] ORS;  Service: Urology;  Laterality: N/A;   TEE WITHOUT CARDIOVERSION N/A 10/09/2021   Procedure: TRANSESOPHAGEAL ECHOCARDIOGRAM (TEE);  Surgeon: Janalyn Keene NOVAK, MD;  Location: [facility] ENDOSCOPY;  Service: Endoscopy;  Laterality: N/A;   WISDOM TOOTH EXTRACTION      FAMILY HISTORY: Family History  Problem Relation Age of Onset   Leukemia Mother    Colon cancer Father    Diabetes Father    Heart disease Father    Diabetes Brother     ADVANCED DIRECTIVES (Y/N):  N  HEALTH MAINTENANCE: Social History   Tobacco Use   Smoking status: Never   Smokeless tobacco: Never  Vaping Use  Vaping status: Never Used  Substance Use Topics   Alcohol use: Yes    Comment: not at all   Drug use: No      Colonoscopy:  PAP:  Bone density:  Lipid panel:  No Known Allergies  Current Outpatient Medications  Medication Sig Dispense Refill   acetaminophen  (TYLENOL ) 500 MG tablet Take 500-1,000 mg by mouth every 6 (six) hours as needed for mild pain.     Blood Glucose Monitoring Suppl (ONETOUCH VERIO FLEX SYSTEM) w/Device KIT See admin instructions.     cholecalciferol (VITAMIN D3) 25 MCG (1000 UNIT) tablet Take 1,000 Units by mouth daily.     citalopram  (CELEXA ) 40 MG tablet Take 40 mg by mouth daily.     ELIQUIS 5 MG TABS tablet Take 5 mg by mouth daily. (Patient taking differently: Take 5 mg by mouth daily. Pt reports taking twice a day)     ibrutinib  (IMBRUVICA ) 420 MG tablet Take 1 tablet (420 mg total) by mouth daily. Take with a glass of water. 28 tablet 2   insulin  detemir (LEVEMIR) 100 UNIT/ML injection Inject 15 Units into the skin 2 (two) times daily.     Insulin  Pen Needle 31G X 5 MM MISC Use as directed 60 each 2   Lancets (FREESTYLE) lancets Use as instructed 100 each 2   metFORMIN  (GLUCOPHAGE ) 1000 MG tablet Take 1,000 mg by mouth 2 (two) times daily with a meal. (Patient taking differently: Take 1,000 mg by mouth 2 (two) times daily with a meal. Pt reports taking once a day)     metoprolol succinate (TOPROL-XL) 25 MG 24 hr tablet Take 25 mg by mouth daily.     Multiple Vitamin (MULTIVITAMIN WITH MINERALS) TABS tablet Take 1 tablet by mouth daily.     omeprazole  (PRILOSEC) 20 MG capsule TAKE 1 CAPSULE BY MOUTH EVERY DAY 90 capsule 3   ONETOUCH VERIO test strip 2 (two) times daily. use for testing     QUEtiapine (SEROQUEL) 25 MG tablet Take 25 mg by mouth at bedtime.     sildenafil  (REVATIO ) 20 MG tablet Take 3-5 tablets 1 hr prior to intercourse as needed 60 tablet 12   No current facility-administered medications for this visit.    OBJECTIVE: Vitals:   09/04/24 1024  BP: 136/87  Pulse: (!) 57  Resp: 16  Temp: (!) 96.3 F (35.7 C)  SpO2: 99%      Body mass index  is 27.75 kg/m.    ECOG FS:2 - Symptomatic, <50% confined to bed  General: Well-developed, well-nourished, no acute distress. Eyes: Pink conjunctiva, anicteric sclera. HEENT: Normocephalic, moist mucous membranes. Lungs: No audible wheezing or coughing. Heart: Regular rate and rhythm. Abdomen: Soft, nontender, no obvious distention. Musculoskeletal: No edema, cyanosis, or clubbing. Neuro: Alert, answering all questions appropriately. Cranial nerves grossly intact. Skin: No rashes or petechiae noted. Psych: Normal affect.  LAB RESULTS:  Lab Results  Component Value Date   NA 136 09/04/2024   K 4.5 09/04/2024   CL 103 09/04/2024   CO2 24 09/04/2024   GLUCOSE 124 (H) 09/04/2024   BUN 21 09/04/2024   CREATININE 1.37 (H) 09/04/2024   CALCIUM 8.6 (L) 09/04/2024   PROT 6.3 (L) 09/04/2024   ALBUMIN 3.8 09/04/2024   AST 15 09/04/2024   ALT 11 09/04/2024   ALKPHOS 63 09/04/2024   BILITOT 0.8 09/04/2024   GFRNONAA 55 (L) 09/04/2024   GFRAA >60 07/05/2020    Lab Results  Component Value Date   WBC 7.2  09/04/2024   NEUTROABS 3.5 09/04/2024   HGB 14.5 09/04/2024   HCT 44.4 09/04/2024   MCV 83.6 09/04/2024   PLT 164 09/04/2024     STUDIES: No results found.  ASSESSMENT: CLL.  PLAN:    CLL: Patient underwent right axillary lymph node biopsy on December 24, 2021 confirmed persistent disease.  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  Patient initially was on Imbruvica  from November 2017 through November 2022 and then had a 1 year treatment holiday.  He restarted treatment in November 2023 secondary to worsening night sweats, fatigue, and slightly progressed axillary lymph nodes.  CT scan of the chest on March 08, 2024 did not reveal any progressive lymphadenopathy.  His most recent abdominal and pelvic imaging on October 13, 2022 also did not reveal any significant lymphadenopathy.  No intervention is needed.  No further imaging is necessary unless  there is suspicion of progression of disease.  Continue Imbruvica  as prescribed.  Return to clinic in 3 months with laboratory work and evaluation by clinical pharmacy.  Patient will then return to clinic in 6 months with repeat laboratory, further evaluation, and continuation of treatment.   History of neuropathy, foot drop: Chronic and unchanged.  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  Patient was previously given a referral to Occupational Therapy to help improve balance. Lewy body disease/Parkinson's: Continue monitoring and treatment per neurology. Bladder outlet obstruction secondary to BPH: Continue follow-up with urology as scheduled. Renal insufficiency: Mild, monitor.    Patient expressed understanding and was in agreement with this plan. He also understands that He can call clinic at any time with any questions, concerns, or complaints.    Evalene JINNY Reusing, MD   09/04/2024 12:53 PM

## 2024-09-04 NOTE — Progress Notes (Signed)
 Pt reports being more fatigued and having balance issues.  Pt also states he turned computer tower over on his left foot and has some swelling and bruising.  Pt did not seek medical attention.

## 2024-09-08 ENCOUNTER — Telehealth: Payer: Self-pay

## 2024-09-08 NOTE — Telephone Encounter (Signed)
 Pt called back, stated he doesn't know why he is calling as he had a missed call from this number. I told pt that Macario called him and left a vm for him. He stated he will listen to vm now.

## 2024-09-08 NOTE — Telephone Encounter (Signed)
 Clinical Social Work was referred by medical provider for assessment of psychosocial needs.  CSW attempted to contact patient by phone.  Left voicemail with contact information and request for return call.

## 2024-09-15 ENCOUNTER — Encounter (INDEPENDENT_AMBULATORY_CARE_PROVIDER_SITE_OTHER): Payer: Self-pay

## 2024-09-15 ENCOUNTER — Other Ambulatory Visit: Payer: Self-pay

## 2024-09-18 ENCOUNTER — Other Ambulatory Visit: Payer: Self-pay

## 2024-09-18 NOTE — Progress Notes (Signed)
 Specialty Pharmacy Refill Coordination Note  Kacey Vicuna. is a 73 y.o. male contacted today regarding refills of specialty medication(s) Ibrutinib  (IMBRUVICA )   Patient requested Delivery   Delivery date: 09/22/24   Verified address: [address] [place], [place] [address]   Medication will be filled on 09/21/24.

## 2024-09-20 ENCOUNTER — Other Ambulatory Visit: Payer: Self-pay

## 2024-10-12 ENCOUNTER — Other Ambulatory Visit: Payer: Self-pay

## 2024-10-12 ENCOUNTER — Other Ambulatory Visit: Payer: Self-pay | Admitting: Pharmacist

## 2024-10-12 ENCOUNTER — Other Ambulatory Visit: Payer: Self-pay | Admitting: Oncology

## 2024-10-12 ENCOUNTER — Encounter (INDEPENDENT_AMBULATORY_CARE_PROVIDER_SITE_OTHER): Payer: Self-pay

## 2024-10-12 DIAGNOSIS — C9110 Chronic lymphocytic leukemia of B-cell type not having achieved remission: Secondary | ICD-10-CM

## 2024-10-12 MED ORDER — IBRUTINIB 420 MG PO TABS
420.0000 mg | ORAL_TABLET | Freq: Every day | ORAL | 2 refills | Status: DC
  Filled 2024-10-12 (×2): qty 28, 28d supply, fill #0
  Filled 2024-11-02 – 2024-11-13 (×2): qty 28, 28d supply, fill #1
  Filled 2024-12-08: qty 28, 28d supply, fill #2

## 2024-10-12 NOTE — Progress Notes (Signed)
   Specialty Pharmacy Refill Coordination Note  Travis G Banfill Jr. is a 73 y.o. male contacted today regarding refills of specialty medication(s) Ibrutinib  (IMBRUVICA )   Patient requested (Patient-Rptd) Delivery   Delivery date: 10/16/24   Verified address: (Patient-Rptd) [address], [place], [place] [address]   Medication will be filled on 10/13/24.

## 2024-10-13 ENCOUNTER — Other Ambulatory Visit: Payer: Self-pay

## 2024-11-02 ENCOUNTER — Telehealth: Payer: Self-pay | Admitting: Pharmacy Technician

## 2024-11-02 ENCOUNTER — Other Ambulatory Visit (HOSPITAL_COMMUNITY): Payer: Self-pay

## 2024-11-02 ENCOUNTER — Other Ambulatory Visit: Payer: Self-pay

## 2024-11-02 ENCOUNTER — Other Ambulatory Visit: Payer: Self-pay | Admitting: Pharmacy Technician

## 2024-11-02 NOTE — Telephone Encounter (Signed)
 Oral Oncology Patient Advocate Encounter  Prior Authorization for Imbruvica  has been approved.    PA# [id number] Effective dates: 11/02/2024 through 11/02/2025  Patient can continue to fill at [facility].   Travis [name] (Patty) Chet Burnet, CPhT  [facility], [place], [facility] - Oral Chemotherapy Patient Advocate Specialist III Phone: [phone]  Fax: [phone]

## 2024-11-02 NOTE — Progress Notes (Signed)
 Oral Oncology Patient Advocate Encounter  Prior authorization renewal has been completed. Please see encounter by me from 11/02/2024 for more detailed information.  Dayquan Buys (Patty) Chet Burnet, CPhT  [facility], [place], [facility] - Oral Chemotherapy Patient Advocate Specialist III Phone: [phone]  Fax: [phone]

## 2024-11-02 NOTE — Telephone Encounter (Addendum)
 Oral Oncology Patient Advocate Encounter   Re-authorization   Received notification that prior authorization for Imbruvica  is required.   PA submitted on CMM via Latent [employer] [id number] Status is pending     Travis [name] (Travis [name]) Chet Burnet, CPhT  [facility], [place], [facility] - Oral Chemotherapy Patient Advocate Specialist III Phone: [phone]  Fax: [phone]

## 2024-11-13 ENCOUNTER — Other Ambulatory Visit (HOSPITAL_COMMUNITY): Payer: Self-pay

## 2024-11-14 ENCOUNTER — Encounter (INDEPENDENT_AMBULATORY_CARE_PROVIDER_SITE_OTHER): Payer: Self-pay

## 2024-11-14 ENCOUNTER — Other Ambulatory Visit: Payer: Self-pay

## 2024-11-14 NOTE — Progress Notes (Signed)
 Specialty Pharmacy Refill Coordination Note  Travis G Brizuela Jr. is a 73 y.o. male contacted today regarding refills of specialty medication(s) Ibrutinib  (IMBRUVICA )   Patient requested (Patient-Rptd) Delivery   Delivery date: 11/20/24   Verified address: (Patient-Rptd) [address] [place], [place] [address]   Medication will be filled on: 11/17/24

## 2024-11-16 ENCOUNTER — Other Ambulatory Visit: Payer: Self-pay

## 2024-11-21 ENCOUNTER — Other Ambulatory Visit: Payer: Self-pay

## 2024-11-21 NOTE — Progress Notes (Signed)
 Specialty Pharmacy Ongoing Clinical Assessment Note  Travis [name]. is a 73 y.o. male who is being followed by the specialty pharmacy service for RxSp Oncology   Patient's specialty medication(s) reviewed today: Ibrutinib  (IMBRUVICA )   Missed doses in the last 4 weeks: 1 (forgot)   Patient/Caregiver did not have any additional questions or concerns.   Therapeutic benefit summary: Patient is achieving benefit   Adverse events/side effects summary: Experienced adverse events/side effects (New itchy rash, uncertain if it is related to Imbruvica . The patient has an appointment with their physician next week and will have the rash evaluated at that time.  No other side effects.)   Patient's therapy is appropriate to: Continue    Goals Addressed             This Visit's Progress    Stabilization of disease   On track    Patient is on track. Patient will maintain adherence Per office notes from 09/04/24, CT scan from 03/08/24 did not reveal any progressive lymphadenopathy.         Follow up: 6 months  Silvano LOISE Dolly Specialty Pharmacist

## 2024-12-01 ENCOUNTER — Other Ambulatory Visit: Payer: Self-pay | Admitting: *Deleted

## 2024-12-01 DIAGNOSIS — C9110 Chronic lymphocytic leukemia of B-cell type not having achieved remission: Secondary | ICD-10-CM

## 2024-12-04 ENCOUNTER — Inpatient Hospital Stay: Admitting: Pharmacist

## 2024-12-04 ENCOUNTER — Inpatient Hospital Stay: Attending: Oncology

## 2024-12-04 VITALS — BP 134/75 | Wt 202.0 lb

## 2024-12-04 DIAGNOSIS — C9110 Chronic lymphocytic leukemia of B-cell type not having achieved remission: Secondary | ICD-10-CM | POA: Diagnosis present

## 2024-12-04 LAB — CMP (CANCER CENTER ONLY)
ALT: 12 U/L (ref 0–44)
AST: 15 U/L (ref 15–41)
Albumin: 3.9 g/dL (ref 3.5–5.0)
Alkaline Phosphatase: 67 U/L (ref 38–126)
Anion gap: 9 (ref 5–15)
BUN: 19 mg/dL (ref 8–23)
CO2: 25 mmol/L (ref 22–32)
Calcium: 9.1 mg/dL (ref 8.9–10.3)
Chloride: 105 mmol/L (ref 98–111)
Creatinine: 1.33 mg/dL — ABNORMAL HIGH (ref 0.61–1.24)
GFR, Estimated: 57 mL/min — ABNORMAL LOW (ref >60)
Glucose, Bld: 189 mg/dL — ABNORMAL HIGH (ref 70–99)
Potassium: 5 mmol/L (ref 3.5–5.1)
Sodium: 139 mmol/L (ref 135–145)
Total Bilirubin: 0.4 mg/dL (ref 0.0–1.2)
Total Protein: 5.9 g/dL — ABNORMAL LOW (ref 6.5–8.1)

## 2024-12-04 LAB — CBC WITH DIFFERENTIAL (CANCER CENTER ONLY)
Abs Immature Granulocytes: 0.11 K/uL — ABNORMAL HIGH (ref 0.00–0.07)
Basophils Absolute: 0.1 K/uL (ref 0.0–0.1)
Basophils Relative: 1 %
Eosinophils Absolute: 0.4 K/uL (ref 0.0–0.5)
Eosinophils Relative: 6 %
HCT: 44 % (ref 39.0–52.0)
Hemoglobin: 14.9 g/dL (ref 13.0–17.0)
Immature Granulocytes: 2 %
Lymphocytes Relative: 27 %
Lymphs Abs: 1.9 K/uL (ref 0.7–4.0)
MCH: 28.2 pg (ref 26.0–34.0)
MCHC: 33.9 g/dL (ref 30.0–36.0)
MCV: 83.2 fL (ref 80.0–100.0)
Monocytes Absolute: 0.9 K/uL (ref 0.1–1.0)
Monocytes Relative: 12 %
Neutro Abs: 3.7 K/uL (ref 1.7–7.7)
Neutrophils Relative %: 52 %
Platelet Count: 164 K/uL (ref 150–400)
RBC: 5.29 MIL/uL (ref 4.22–5.81)
RDW: 12.7 % (ref 11.5–15.5)
WBC Count: 7.1 K/uL (ref 4.0–10.5)
nRBC: 0 % (ref 0.0–0.2)

## 2024-12-04 NOTE — Progress Notes (Signed)
 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 Patient was previously on a ibrutinib  treatment break but resumed therapy on 10/15/22.  Reason for Consult: Oral chemotherapy follow-up for ibrutinib  therapy.   PAST MEDICAL HISTORY: Past Medical History:  Diagnosis Date   Anxiety    Depression    Diabetes mellitus without complication (HCC)    GERD (gastroesophageal reflux disease)    Leukemia (HCC)    chronic lymphocytic   Pneumonia     HEMATOLOGY/ONCOLOGY HISTORY:  Oncology History   No history exists.    ALLERGIES:  has no known allergies.  MEDICATIONS:  Current Outpatient Medications  Medication Sig Dispense Refill   acetaminophen  (TYLENOL ) 500 MG tablet Take 500-1,000 mg by mouth every 6 (six) hours as needed for mild pain.     Blood Glucose Monitoring Suppl (ONETOUCH VERIO FLEX SYSTEM) w/Device KIT See admin instructions.     cholecalciferol (VITAMIN D3) 25 MCG (1000 UNIT) tablet Take 1,000 Units by mouth daily.     citalopram  (CELEXA ) 40 MG tablet Take 40 mg by mouth daily.     ELIQUIS 5 MG TABS tablet Take 5 mg by mouth daily. (Patient taking differently: Take 5 mg by mouth daily. Pt reports taking twice a day)     ibrutinib  (IMBRUVICA ) 420 MG tablet Take 1 tablet (420 mg total) by mouth daily. Take with a glass of water. 28 tablet 2   insulin  detemir (LEVEMIR) 100 UNIT/ML injection Inject 15 Units into the skin 2 (two) times daily.     Insulin  Pen Needle 31G X 5 MM MISC Use as directed 60 each 2   Lancets (FREESTYLE) lancets Use as  instructed 100 each 2   metFORMIN  (GLUCOPHAGE ) 1000 MG tablet Take 1,000 mg by mouth 2 (two) times daily with a meal. (Patient taking differently: Take 1,000 mg by mouth 2 (two) times daily with a meal. Pt reports taking once a day)     metoprolol succinate (TOPROL-XL) 25 MG 24 hr tablet Take 25 mg by mouth daily.     Multiple Vitamin (MULTIVITAMIN WITH MINERALS) TABS tablet Take 1 tablet by mouth daily.     omeprazole  (PRILOSEC) 20 MG capsule TAKE 1 CAPSULE BY MOUTH EVERY DAY 90 capsule 3   ONETOUCH VERIO test strip 2 (two) times daily. use for testing     QUEtiapine (SEROQUEL) 25 MG tablet Take 25 mg by mouth at bedtime.     sildenafil  (REVATIO ) 20 MG tablet Take 3-5 tablets 1 hr prior to intercourse as needed 60 tablet 12   No current facility-administered medications for this visit.    VITAL SIGNS: There were no vitals taken for this visit. There were no vitals filed for this visit.  Estimated body mass index is 27.75 kg/m as calculated from the following:   Height as of 12/24/22: 5' 11 (1.803 m).   Weight as of 09/04/24: 90.3 kg (199 lb).  LABS: CBC:    Component Value Date/Time   WBC 7.2 09/04/2024 1003   HGB 14.5 09/04/2024 1003   HGB 15.0 03/02/2024 0847   HCT 44.4 09/04/2024 1003  PLT 164 09/04/2024 1003   PLT 152 03/02/2024 0847   MCV 83.6 09/04/2024 1003   NEUTROABS 3.5 09/04/2024 1003   LYMPHSABS 2.3 09/04/2024 1003   MONOABS 0.9 09/04/2024 1003   EOSABS 0.3 09/04/2024 1003   BASOSABS 0.1 09/04/2024 1003   Comprehensive Metabolic Panel:    Component Value Date/Time   NA 136 09/04/2024 1003   K 4.5 09/04/2024 1003   CL 103 09/04/2024 1003   CO2 24 09/04/2024 1003   BUN 21 09/04/2024 1003   CREATININE 1.37 (H) 09/04/2024 1003   GLUCOSE 124 (H) 09/04/2024 1003   CALCIUM 8.6 (L) 09/04/2024 1003   AST 15 09/04/2024 1003   ALT 11 09/04/2024 1003   ALKPHOS 63 09/04/2024 1003   BILITOT 0.8 09/04/2024 1003   PROT 6.3 (L) 09/04/2024 1003   ALBUMIN 3.8 09/04/2024  1003     Present during today's visit: patient only  Assessment and Plan: CBC reviewed, continue ibrutinib  420mg  daily CMP still pending, will follow-up after appointment and contact patient at there is any concern.  He knows if he does not hear from the office that his CMP is okay Overall patient is doing well and has not had many changes in his health since his last office visit.  He did report hurting his back in his sleep about 4 weeks ago, this was during the time he was completing physical therapy as ordered by his neurologist for balance issues.  His back pain is slowly getting better.  Oral Chemotherapy Side Effect/Intolerance:  No reported side effects  Oral Chemotherapy Adherence: No missed doses reported No patient barriers to medication adherence identified.   New medications: Patient reports starting Seroquel within the last month, no drug interaction with ibrutinib   Medication Access Issues: No issues, patient fills at [facility] (Specialty)  Patient expressed understanding and was in agreement with this plan. He also understands that He can call clinic at any time with any questions, concerns, or complaints.   Follow-up plan: RTC in 3 months  Thank you for allowing me to participate in the care of this very pleasant patient.   Time Total: 15 mins  Visit consisted of counseling and education on dealing with issues of symptom management in the setting of serious and potentially life-threatening illness.Greater than 50%  of this time was spent counseling and coordinating care related to the above assessment and plan.  Signed by: Khaleah Duer N. Donold Marotto, PharmD, BCPS, NEILA, CPP Hematology/Oncology Clinical Pharmacist Practitioner [place]/DB/AP Oral Chemotherapy Navigation Clinic [phone]  12/04/2024 8:56 AM

## 2024-12-08 ENCOUNTER — Other Ambulatory Visit: Payer: Self-pay

## 2024-12-12 ENCOUNTER — Other Ambulatory Visit: Payer: Self-pay

## 2024-12-12 NOTE — Progress Notes (Signed)
 Specialty Pharmacy Refill Coordination Note  Travis G Kassim Jr. is a 73 y.o. male contacted today regarding refills of specialty medication(s) Ibrutinib  (IMBRUVICA )   Patient requested Delivery   Delivery date: 12/22/24   Verified address: [address] [place] [address]   Medication will be filled on: 12/20/24

## 2024-12-20 ENCOUNTER — Other Ambulatory Visit: Payer: Self-pay

## 2025-01-10 ENCOUNTER — Other Ambulatory Visit: Payer: Self-pay | Admitting: Oncology

## 2025-01-10 ENCOUNTER — Other Ambulatory Visit: Payer: Self-pay

## 2025-01-10 DIAGNOSIS — C9110 Chronic lymphocytic leukemia of B-cell type not having achieved remission: Secondary | ICD-10-CM

## 2025-01-10 MED ORDER — IBRUTINIB 420 MG PO TABS
420.0000 mg | ORAL_TABLET | Freq: Every day | ORAL | 2 refills | Status: AC
  Filled 2025-01-10 (×2): qty 28, 28d supply, fill #0

## 2025-01-11 ENCOUNTER — Other Ambulatory Visit (HOSPITAL_COMMUNITY): Payer: Self-pay

## 2025-01-11 ENCOUNTER — Other Ambulatory Visit: Payer: Self-pay

## 2025-01-11 NOTE — Progress Notes (Signed)
 Specialty Pharmacy Refill Coordination Note  MyChart Questionnaire Submission  Travis G Gonser Jr. is a 74 y.o. male contacted today regarding refills of specialty medication(s) Imbruvica .  Doses on hand: (Patient-Rptd) 18   Patient requested: (Patient-Rptd) Delivery   Delivery date: 01/24/25  Verified address: [address] [place] [address]  Medication will be filled on 01/23/25

## 2025-01-17 ENCOUNTER — Other Ambulatory Visit: Payer: Self-pay

## 2025-01-17 NOTE — Progress Notes (Signed)
 Patient was contacted by the pharmacy regarding their specialty medication(s) Imbruvica . Due to impending cold weather conditions patient is requesting a delayed delivery. Medication(s) will be filled 01/25/25 for a delivery by 01/26/25

## 2025-01-25 ENCOUNTER — Other Ambulatory Visit: Payer: Self-pay

## 2025-03-05 ENCOUNTER — Other Ambulatory Visit

## 2025-03-05 ENCOUNTER — Ambulatory Visit: Admitting: Oncology
